# Patient Record
Sex: Female | Born: 1960 | Race: White | Hispanic: No | Smoking: Never smoker
Health system: Southern US, Community
[De-identification: ages and names within clinical notes are randomized; demographics above are authoritative.]

## PROBLEM LIST (undated history)

## (undated) DIAGNOSIS — E28319 Asymptomatic premature menopause: Secondary | ICD-10-CM

## (undated) DIAGNOSIS — E669 Obesity, unspecified: Secondary | ICD-10-CM

## (undated) DIAGNOSIS — T8859XA Other complications of anesthesia, initial encounter: Secondary | ICD-10-CM

## (undated) DIAGNOSIS — I10 Essential (primary) hypertension: Secondary | ICD-10-CM

## (undated) DIAGNOSIS — M1990 Unspecified osteoarthritis, unspecified site: Secondary | ICD-10-CM

## (undated) DIAGNOSIS — T4145XA Adverse effect of unspecified anesthetic, initial encounter: Secondary | ICD-10-CM

## (undated) DIAGNOSIS — E785 Hyperlipidemia, unspecified: Secondary | ICD-10-CM

## (undated) DIAGNOSIS — G4730 Sleep apnea, unspecified: Secondary | ICD-10-CM

## (undated) DIAGNOSIS — I480 Paroxysmal atrial fibrillation: Secondary | ICD-10-CM

## (undated) HISTORY — PX: TUBAL LIGATION: SHX77

## (undated) HISTORY — DX: Obesity, unspecified: E66.9

## (undated) HISTORY — PX: FOOT SURGERY: SHX648

## (undated) HISTORY — PX: OTHER SURGICAL HISTORY: SHX169

## (undated) HISTORY — DX: Asymptomatic premature menopause: E28.319

## (undated) HISTORY — DX: Paroxysmal atrial fibrillation: I48.0

## (undated) HISTORY — PX: CATARACT EXTRACTION W/ INTRAOCULAR LENS  IMPLANT, BILATERAL: SHX1307

## (undated) HISTORY — DX: Essential (primary) hypertension: I10

## (undated) HISTORY — DX: Unspecified osteoarthritis, unspecified site: M19.90

## (undated) HISTORY — DX: Hyperlipidemia, unspecified: E78.5

---

## 1980-08-18 HISTORY — PX: CHOLECYSTECTOMY: SHX55

## 2006-05-20 ENCOUNTER — Ambulatory Visit: Payer: Self-pay

## 2008-06-15 ENCOUNTER — Ambulatory Visit: Payer: Self-pay | Admitting: Internal Medicine

## 2008-07-18 ENCOUNTER — Ambulatory Visit: Payer: Self-pay | Admitting: Internal Medicine

## 2008-09-19 ENCOUNTER — Ambulatory Visit: Payer: Self-pay | Admitting: Internal Medicine

## 2009-07-28 ENCOUNTER — Emergency Department: Payer: Self-pay | Admitting: Internal Medicine

## 2011-04-18 ENCOUNTER — Ambulatory Visit (INDEPENDENT_AMBULATORY_CARE_PROVIDER_SITE_OTHER): Payer: Self-pay | Admitting: Internal Medicine

## 2011-04-18 ENCOUNTER — Encounter: Payer: Self-pay | Admitting: Internal Medicine

## 2011-04-18 DIAGNOSIS — H6690 Otitis media, unspecified, unspecified ear: Secondary | ICD-10-CM | POA: Insufficient documentation

## 2011-04-18 DIAGNOSIS — Z124 Encounter for screening for malignant neoplasm of cervix: Secondary | ICD-10-CM

## 2011-04-18 DIAGNOSIS — G43809 Other migraine, not intractable, without status migrainosus: Secondary | ICD-10-CM | POA: Insufficient documentation

## 2011-04-18 DIAGNOSIS — D126 Benign neoplasm of colon, unspecified: Secondary | ICD-10-CM | POA: Insufficient documentation

## 2011-04-18 DIAGNOSIS — Z1239 Encounter for other screening for malignant neoplasm of breast: Secondary | ICD-10-CM | POA: Insufficient documentation

## 2011-04-18 DIAGNOSIS — Z1211 Encounter for screening for malignant neoplasm of colon: Secondary | ICD-10-CM

## 2011-04-18 NOTE — Patient Instructions (Addendum)
Consider daily zyrtec (cetirizine) 10 mg daily to prevent recufrrent ear problems due to allergies causinguestavhian tube congestion,  Add sudafed PE 10 ng every 6 hours as needed for the first 2-3 days to decongest.  Consider contacting [facility] for knee compression stockings.  (website is [email or web address])

## 2011-04-19 NOTE — Assessment & Plan Note (Signed)
She is long overdue for an annual mammogram and is being scheduled today for one.

## 2011-04-19 NOTE — Assessment & Plan Note (Signed)
She will return in 3 months for her annual overdue physical and have PAP done.

## 2011-04-19 NOTE — Progress Notes (Signed)
  Subjective:    Patient ID: Jessica Clayton, female    DOB: [birth year], 50 y.o.   MRN: [id number]  HPI Jessica Clayton is a 50 yr old white female who presents to establish primary care.  She has had no medical followup in 5 years. She has no acute medical issues today.   Review of Systems  Constitutional: Positive for unexpected weight change. Negative for fever, chills, diaphoresis and activity change.  HENT: Negative for hearing loss, ear pain, nosebleeds, congestion, sore throat, facial swelling, rhinorrhea, sneezing, mouth sores, trouble swallowing, neck pain, neck stiffness, voice change, postnasal drip, sinus pressure, tinnitus and ear discharge.   Eyes: Negative for pain, discharge, redness and visual disturbance.  Respiratory: Negative for cough, chest tightness, shortness of breath, wheezing and stridor.   Cardiovascular: Negative for chest pain, palpitations and leg swelling.  Musculoskeletal: Negative for myalgias and arthralgias.  Skin: Negative for color change and rash.  Neurological: Negative for dizziness, weakness, light-headedness and headaches.  Hematological: Negative for adenopathy.       Objective:   Physical Exam  Constitutional: She is oriented to person, place, and time. She appears well-developed and well-nourished.  HENT:  Mouth/Throat: Oropharynx is clear and moist.  Eyes: EOM are normal. Pupils are equal, round, and reactive to light. No scleral icterus.  Neck: Normal range of motion. Neck supple. No JVD present. No thyromegaly present.  Cardiovascular: Normal rate, regular rhythm, normal heart sounds and intact distal pulses.   Pulmonary/Chest: Effort normal and breath sounds normal.  Abdominal: Soft. Bowel sounds are normal. She exhibits no mass. There is no tenderness.  Musculoskeletal: Normal range of motion. She exhibits no edema.  Lymphadenopathy:    She has no cervical adenopathy.  Neurological: She is alert and oriented to person, place, and time.  Skin:  Skin is warm and dry.  Psychiatric: She has a normal mood and affect. Judgment normal.          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

## 2011-04-19 NOTE — Assessment & Plan Note (Signed)
She will be scheduled for screenign colonoscopy this year.  SHe has a positive family history  but not at an early age.

## 2011-04-19 NOTE — Assessment & Plan Note (Signed)
She has not had a migraine in nearly one year.  Will issue rx for imitrex if headaches return.

## 2011-04-19 NOTE — Assessment & Plan Note (Signed)
Her exam today did suggest some congestion behind the ear.  Recommended a few days of sudafed PE to resolve the congestion and daily use of antihistamine.

## 2011-04-25 ENCOUNTER — Telehealth: Payer: Self-pay | Admitting: Internal Medicine

## 2011-04-25 NOTE — Telephone Encounter (Signed)
Pt aware of mammogram appointment , Pt also stated she needed an appointment with dr Mechele Collin @ [facility] for a colonscopy she wanted to know if you were going to make that appointment or does she

## 2011-05-14 ENCOUNTER — Telehealth: Payer: Self-pay | Admitting: Internal Medicine

## 2011-05-14 ENCOUNTER — Emergency Department: Payer: Self-pay | Admitting: *Deleted

## 2011-05-14 ENCOUNTER — Other Ambulatory Visit: Payer: Self-pay | Admitting: Internal Medicine

## 2011-05-14 MED ORDER — AMLODIPINE BESYLATE 2.5 MG PO TABS: 2.5000 mg | ORAL_TABLET | Freq: Every day | ORAL | Status: DC

## 2011-05-14 NOTE — Telephone Encounter (Signed)
Refill has been called in

## 2011-05-14 NOTE — Telephone Encounter (Signed)
Per Dr. Darrick Huntsman patient needs refill on Amlodiphine.

## 2011-05-15 ENCOUNTER — Ambulatory Visit (INDEPENDENT_AMBULATORY_CARE_PROVIDER_SITE_OTHER): Payer: Self-pay | Admitting: Internal Medicine

## 2011-05-15 ENCOUNTER — Encounter: Payer: Self-pay | Admitting: Internal Medicine

## 2011-05-15 DIAGNOSIS — E669 Obesity, unspecified: Secondary | ICD-10-CM | POA: Insufficient documentation

## 2011-05-15 DIAGNOSIS — F419 Anxiety disorder, unspecified: Secondary | ICD-10-CM | POA: Insufficient documentation

## 2011-05-15 DIAGNOSIS — H9209 Otalgia, unspecified ear: Secondary | ICD-10-CM

## 2011-05-15 DIAGNOSIS — F411 Generalized anxiety disorder: Secondary | ICD-10-CM

## 2011-05-15 DIAGNOSIS — H9203 Otalgia, bilateral: Secondary | ICD-10-CM

## 2011-05-15 DIAGNOSIS — I10 Essential (primary) hypertension: Secondary | ICD-10-CM

## 2011-05-15 DIAGNOSIS — R51 Headache: Secondary | ICD-10-CM

## 2011-05-15 MED ORDER — TRAMADOL HCL 50 MG PO TABS: 50.0000 mg | ORAL_TABLET | Freq: Four times a day (QID) | ORAL | Status: DC | PRN

## 2011-05-15 MED ORDER — ALPRAZOLAM 0.5 MG PO TABS: 0.5000 mg | ORAL_TABLET | Freq: Every day | ORAL | Status: DC | PRN

## 2011-05-15 NOTE — Assessment & Plan Note (Signed)
She has lost 5 lbs in the past months by addressing the sugar excess in her diet.  Recommended exercising 25 minutes daily to boost regimen.

## 2011-05-15 NOTE — Assessment & Plan Note (Signed)
I suspect her recent elevations were caused by anxiety as well as her recent use of OTC meds including ibuprofen.  Cotnineu 2.5 mg almlodipine and increase to5  Mg in one week for SBP > 130/80.

## 2011-05-15 NOTE — Patient Instructions (Signed)
Aoid all products  Containing ibuprofen, and naproxen for the next month. Use tylenol or tramadol as needed for pain .  Avoid all products containing Sudafed (pseudoephedrine) or PE (phenylephrine) which are in cold remedies.  Use Afrin nasal spray if needed for relief of congestion.    If you bp is> 130/80 on Monday, you may increase the amlodipine to 5 mg daily .  I am adding a low dose of alprazolam (Xanax) to use once a day if needed for feelings of panic or extreme anxiety.

## 2011-05-15 NOTE — Progress Notes (Signed)
  Subjective:    Patient ID: Jessica Clayton, female    DOB: 01/16/1961, 50 y.o.   MRN: [id number]  HPI  50 yo white female with history of migraine headaches, borderline hypertension, and obesity presents for urgent evaluation of  Elevated blood pressures.  Patient ahs been taking inupfrofen 800 mg once or twice daily since last week when she returned from a beach trip with a headache.  She noted elevated blood pressure in the 170s and was concerned that the headaches wsere due to hypertensive urgency.  Was seen by Employee Vilma Prader and was sent t ER for bp in the 190's. Was told to folowup up with me today.  She had called me prior to ER evaluaiton at which time I prescribed amlodipine and stopped her use of NSAIDs.     Review of Systems  Constitutional: Negative for fever, chills and unexpected weight change.  HENT: Negative for hearing loss, ear pain, nosebleeds, congestion, sore throat, facial swelling, rhinorrhea, sneezing, mouth sores, trouble swallowing, neck pain, neck stiffness, voice change, postnasal drip, sinus pressure, tinnitus and ear discharge.   Eyes: Negative for pain, discharge, redness and visual disturbance.  Respiratory: Negative for cough, chest tightness, shortness of breath, wheezing and stridor.   Cardiovascular: Negative for chest pain, palpitations and leg swelling.  Musculoskeletal: Negative for myalgias and arthralgias.  Skin: Negative for color change and rash.  Neurological: Positive for headaches. Negative for dizziness, weakness and light-headedness.  Hematological: Negative for adenopathy.  Psychiatric/Behavioral: Positive for sleep disturbance. The patient is nervous/anxious.        Objective:   Physical Exam  Constitutional: She is oriented to person, place, and time. She appears well-developed and well-nourished.  HENT:  Mouth/Throat: Oropharynx is clear and moist.  Eyes: EOM are normal. Pupils are equal, round, and reactive to light. No scleral icterus.    Neck: Normal range of motion. Neck supple. No JVD present. No thyromegaly present.  Cardiovascular: Normal rate, regular rhythm, normal heart sounds and intact distal pulses.   Pulmonary/Chest: Effort normal and breath sounds normal.  Abdominal: Soft. Bowel sounds are normal. She exhibits no mass. There is no tenderness.  Musculoskeletal: Normal range of motion. She exhibits no edema.  Lymphadenopathy:    She has no cervical adenopathy.  Neurological: She is alert and oriented to person, place, and time.  Skin: Skin is warm and dry.  Psychiatric: She has a normal mood and affect.          Assessment & Plan:

## 2011-05-15 NOTE — Assessment & Plan Note (Signed)
Aggravated by home stressor including daughter's difficulty raising her children with their father.  Patient is rasing her 51 yr old grandson.  Trial of alprazolam for insomnia or panic attacks.

## 2011-05-16 NOTE — Telephone Encounter (Signed)
Can I refer patient to Dr. Mechele Collin at [facility]?

## 2011-05-20 NOTE — Telephone Encounter (Signed)
Yes

## 2011-06-06 ENCOUNTER — Ambulatory Visit: Payer: Self-pay | Admitting: Internal Medicine

## 2011-06-25 ENCOUNTER — Encounter: Payer: Self-pay | Admitting: Internal Medicine

## 2011-07-02 ENCOUNTER — Telehealth: Payer: Self-pay | Admitting: Internal Medicine

## 2011-07-02 NOTE — Telephone Encounter (Signed)
Patient called and stated since you started her on the BP medication her BP is still elevated.  She stated on two different occasions it has been 176/84.  But most of the time it is in the 140's.  She said her pulse readings have been in between 60-68, and you had told her you would like the BP to be in the 130's but she said it has never been that low.  Please advise.

## 2011-07-02 NOTE — Telephone Encounter (Signed)
She was instructed to increase the amlodipine to 5 mg daily for bp > 130/80.  Has she done that?  If yes,  We will add Hctz 25 mg one tablet daily to the 5 mg of amlodipine.  It is a diuretic and will make her urinate more so make sure she takes it as soon as she gets up in the morning.  Qty 30 2 refills

## 2011-07-03 NOTE — Telephone Encounter (Signed)
Left message asking patient to return my call.

## 2011-07-15 NOTE — Telephone Encounter (Signed)
Left another message for patient to return my call.

## 2011-07-24 NOTE — Telephone Encounter (Signed)
I called patient back, she answered this time and has been notified of the message.  She stated she has increase the amlodipine to 5 mg and her BP has been running great.  I advised her that if her BP was elevated again to call back and we will call in the HCTZ as Dr. Darrick Huntsman said.

## 2011-09-02 ENCOUNTER — Other Ambulatory Visit: Payer: Self-pay | Admitting: *Deleted

## 2011-09-02 MED ORDER — AMLODIPINE BESYLATE 2.5 MG PO TABS: 2.5000 mg | ORAL_TABLET | Freq: Every day | ORAL | Status: DC

## 2011-09-08 ENCOUNTER — Ambulatory Visit (INDEPENDENT_AMBULATORY_CARE_PROVIDER_SITE_OTHER): Payer: PRIVATE HEALTH INSURANCE | Admitting: Internal Medicine

## 2011-09-08 ENCOUNTER — Encounter: Payer: Self-pay | Admitting: Internal Medicine

## 2011-09-08 ENCOUNTER — Other Ambulatory Visit (HOSPITAL_COMMUNITY)
Admission: RE | Admit: 2011-09-08 | Discharge: 2011-09-08 | Disposition: A | Discharge status: Home / Self Care | Payer: PRIVATE HEALTH INSURANCE | Source: Ambulatory Visit | Attending: Internal Medicine | Admitting: Internal Medicine

## 2011-09-08 VITALS — BP 134/86 | HR 72 | Temp 98.5°F | Wt 229.0 lb

## 2011-09-08 DIAGNOSIS — Z01419 Encounter for gynecological examination (general) (routine) without abnormal findings: Secondary | ICD-10-CM | POA: Insufficient documentation

## 2011-09-08 DIAGNOSIS — Z1159 Encounter for screening for other viral diseases: Secondary | ICD-10-CM | POA: Insufficient documentation

## 2011-09-08 DIAGNOSIS — Z1211 Encounter for screening for malignant neoplasm of colon: Secondary | ICD-10-CM

## 2011-09-08 DIAGNOSIS — I10 Essential (primary) hypertension: Secondary | ICD-10-CM

## 2011-09-08 DIAGNOSIS — Z124 Encounter for screening for malignant neoplasm of cervix: Secondary | ICD-10-CM

## 2011-09-08 MED ORDER — LISINOPRIL-HYDROCHLOROTHIAZIDE 20-25 MG PO TABS: 1.0000 | ORAL_TABLET | Freq: Every day | ORAL | Status: DC

## 2011-09-08 NOTE — Progress Notes (Signed)
  Subjective:     Jessica Clayton is a 51 y.o. female  with a history of migraine headaches hypertension and obesity who is here for a comprehensive physical exam. The patient reports problems - In achieving weight loss.  History   Social History  . Marital Status: Divorced    Spouse Name: N/A    Number of Children: N/A  . Years of Education: N/A   Occupational History  . Not on file.   Social History Main Topics  . Smoking status: Never Smoker   . Smokeless tobacco: Never Used  . Alcohol Use: No  . Drug Use: No  . Sexually Active: Not on file   Other Topics Concern  . Not on file   Social History Narrative  . No narrative on file   Health Maintenance  Topic Date Due  . Pap Smear  07/11/1978  . Tetanus/tdap  07/12/1979  . Colonoscopy  07/11/2010  . Influenza Vaccine  05/19/2011  . Mammogram  06/05/2013    The following portions of the patient's history were reviewed and updated as appropriate: allergies, current medications, past family history, past medical history, past social history, past surgical history and problem list.  Review of Systems A comprehensive review of systems was negative.   Objective:  BP 134/86  Pulse 72  Temp(Src) 98.5 F (36.9 C) (Oral)  Wt 229 lb (103.874 kg)  SpO2 97%  General Appearance:    Alert, cooperative, no distress, appears stated age  Head:    Normocephalic, without obvious abnormality, atraumatic  Eyes:    PERRL, conjunctiva/corneas clear, EOM's intact, fundi    benign, both eyes  Ears:    Normal TM's and external ear canals, both ears  Nose:   Nares normal, septum midline, mucosa normal, no drainage    or sinus tenderness  Throat:   Lips, mucosa, and tongue normal; teeth and gums normal  Neck:   Supple, symmetrical, trachea midline, no adenopathy;    thyroid:  no enlargement/tenderness/nodules; no carotid   bruit or JVD  Back:     Symmetric, no curvature, ROM normal, no CVA tenderness  Lungs:     Clear to auscultation  bilaterally, respirations unlabored  Chest Wall:    No tenderness or deformity   Heart:    Regular rate and rhythm, S1 and S2 normal, no murmur, rub   or gallop  Breast Exam:    No tenderness, masses, or nipple abnormality  Abdomen:     Soft, non-tender, bowel sounds active all four quadrants,    no masses, no organomegaly  Genitalia:    Normal female without lesion, discharge or tenderness  Rectal:    Normal tone, normal prostate, no masses or tenderness;   guaiac negative stool  Extremities:   Extremities normal, atraumatic, no cyanosis or edema  Pulses:   2+ and symmetric all extremities  Skin:   Skin color, texture, turgor normal, no rashes or lesions  Lymph nodes:   Cervical, supraclavicular, and axillary nodes normal  Neurologic:   CNII-XII intact, normal strength, sensation and reflexes    throughout        Assessment:    Healthy female annual exam.  Obesity Hypertension Screening for colon CA   Plan:  PAP smear done She is up to date on mammogram She is due for colonoscopy and requests referral to Dr. Mechele Collin   See After Visit Summary for Counseling Recommendations

## 2011-09-08 NOTE — Assessment & Plan Note (Signed)
Her blood pressure is not well controlled on current regimen and she is having an exacerbation of lower extremity edema. We are changing her blood pressure medication today from amlodipine to lisinopril/HCTZ. She was given an order form for a repeat female in one week.

## 2011-09-08 NOTE — Patient Instructions (Signed)
WE ARE CHANGING YOUR BP MEDICATION TO LISINOPRIL/HCTZ TO MANAGED GTHE FLUID IN YOUR LEGS.Marland Kitchen

## 2011-09-13 LAB — HM PAP SMEAR: HM Pap smear: NORMAL

## 2011-09-15 ENCOUNTER — Encounter: Payer: Self-pay | Admitting: *Deleted

## 2011-09-21 ENCOUNTER — Other Ambulatory Visit: Payer: Self-pay | Admitting: Internal Medicine

## 2011-09-21 LAB — BASIC METABOLIC PANEL
BUN: 16 mg/dL (ref 7–18)
Calcium, Total: 9 mg/dL (ref 8.5–10.1)
Chloride: 97 mmol/L — ABNORMAL LOW (ref 98–107)
Co2: 31 mmol/L (ref 21–32)
Creatinine: 0.78 mg/dL (ref 0.60–1.30)
Glucose: 97 mg/dL (ref 65–99)
Potassium: 4.2 mmol/L (ref 3.5–5.1)
Sodium: 138 mmol/L (ref 136–145)

## 2011-10-28 ENCOUNTER — Encounter: Payer: Self-pay | Admitting: Internal Medicine

## 2011-11-06 ENCOUNTER — Ambulatory Visit: Payer: Self-pay | Admitting: Unknown Physician Specialty

## 2011-11-19 ENCOUNTER — Telehealth: Payer: Self-pay | Admitting: Internal Medicine

## 2011-11-19 NOTE — Telephone Encounter (Signed)
Office Message [address] [place], [place] [address] p. [phone] f. [phone] To: [facility] (Daytime Triage) Fax: [phone] From: [facility] Date/ Time: 11/18/2011 3:42 PM Taken By: Ezra Sites, RN Caller: Liborio Nixon Facility: Not Collected Patient: Jessica Clayton, Jessica Clayton DOB: July 28, 1961 Phone: [phone] Reason for Call: Caller was unable to be reached on callback - Left Message Regarding Appointment: No Appt Date: Appt Time: Unknown Provider: Reason: Details: Outcome

## 2011-11-25 ENCOUNTER — Encounter: Payer: Self-pay | Admitting: Internal Medicine

## 2012-01-26 ENCOUNTER — Telehealth: Payer: Self-pay | Admitting: Internal Medicine

## 2012-01-26 NOTE — Telephone Encounter (Signed)
Caller: Jessica Clayton/Patient; PCP: Duncan Dull; CB#: [phone];  Call regarding Acid Reflux;  onset: 11/26/11.  Afebrile.  LMP/post menopausal.  Tried Zantac and Tums without relief.  Reports increasing frequency of "spasms" in throat/neck lasting 8-9 min that ends with coughing; during day and night.  Currently sx wake from sleep. Gets a sensation of throat closing and pain. Sister with Lupus died at age 52 of CAD. Advised to see UC or ED within 8 hrs for one or more occurrances of unexplained pain in neck lasing more than a few minutes that has not been evaluated by healthcare provider and has risk factors for cardiovascular disease per Heartburn [facility]in. After evaluation tonight, instructed to call office 01/27/12 for appt.

## 2012-01-27 NOTE — Telephone Encounter (Signed)
Patient called back in this morning for an appointment and I offered her one for Thursday however she couldn't come in, she was also offered an appointment on Friday with Dr Dan Humphreys however patient declined these days as she had to work.  She wants something this afternoon. Please advise her number to be reached today is [phone].

## 2012-01-27 NOTE — Telephone Encounter (Signed)
Tried calling her at number provided, but was told that she had left for the day. Tried calling her home/cell number that is listed in her chart, but got no answer and no way to leave a message.

## 2012-01-29 NOTE — Telephone Encounter (Signed)
Patient returned call and scheduled appt for next week.

## 2012-01-30 ENCOUNTER — Other Ambulatory Visit: Payer: Self-pay | Admitting: Internal Medicine

## 2012-01-30 DIAGNOSIS — R51 Headache: Secondary | ICD-10-CM

## 2012-02-01 MED ORDER — TRAMADOL HCL 50 MG PO TABS: 50.0000 mg | ORAL_TABLET | Freq: Four times a day (QID) | ORAL | Status: DC | PRN

## 2012-02-05 ENCOUNTER — Encounter: Payer: Self-pay | Admitting: Internal Medicine

## 2012-02-05 ENCOUNTER — Ambulatory Visit (INDEPENDENT_AMBULATORY_CARE_PROVIDER_SITE_OTHER): Payer: PRIVATE HEALTH INSURANCE | Admitting: Internal Medicine

## 2012-02-05 ENCOUNTER — Other Ambulatory Visit: Payer: Self-pay | Admitting: Internal Medicine

## 2012-02-05 VITALS — BP 108/64 | HR 60 | Temp 97.9°F | Resp 16 | Wt 217.5 lb

## 2012-02-05 DIAGNOSIS — R079 Chest pain, unspecified: Secondary | ICD-10-CM

## 2012-02-05 DIAGNOSIS — E785 Hyperlipidemia, unspecified: Secondary | ICD-10-CM

## 2012-02-05 DIAGNOSIS — I10 Essential (primary) hypertension: Secondary | ICD-10-CM

## 2012-02-05 DIAGNOSIS — K209 Esophagitis, unspecified without bleeding: Secondary | ICD-10-CM

## 2012-02-05 DIAGNOSIS — E669 Obesity, unspecified: Secondary | ICD-10-CM

## 2012-02-05 HISTORY — DX: Chest pain, unspecified: R07.9

## 2012-02-05 LAB — COMPREHENSIVE METABOLIC PANEL
Albumin: 3.8 g/dL (ref 3.4–5.0)
Alkaline Phosphatase: 90 U/L (ref 50–136)
Anion Gap: 6 — ABNORMAL LOW (ref 7–16)
BUN: 9 mg/dL (ref 7–18)
Bilirubin,Total: 0.6 mg/dL (ref 0.2–1.0)
Creatinine: 0.8 mg/dL (ref 0.60–1.30)
EGFR (African American): >60
EGFR (Non-African Amer.): >60
Glucose: 101 mg/dL — ABNORMAL HIGH (ref 65–99)
Potassium: 3.9 mmol/L (ref 3.5–5.1)
SGPT (ALT): 43 U/L
Total Protein: 7.2 g/dL (ref 6.4–8.2)

## 2012-02-05 LAB — LIPID PANEL
Cholesterol: 149 mg/dL (ref 0–200)
HDL Cholesterol: 39 mg/dL — ABNORMAL LOW (ref 40–60)
VLDL Cholesterol, Calc: 17 mg/dL (ref 5–40)

## 2012-02-05 LAB — CK-MB: CK-MB: 0.7 ng/mL (ref 0.5–3.6)

## 2012-02-05 MED ORDER — ROSUVASTATIN CALCIUM 10 MG PO TABS: 10.0000 mg | ORAL_TABLET | Freq: Every day | ORAL | Status: DC

## 2012-02-05 MED ORDER — NITROGLYCERIN 0.4 MG SL SUBL: 0.4000 mg | SUBLINGUAL_TABLET | SUBLINGUAL | Status: DC | PRN

## 2012-02-05 MED ORDER — DEXLANSOPRAZOLE 60 MG PO CPDR: 60.0000 mg | DELAYED_RELEASE_CAPSULE | Freq: Every day | ORAL | Status: DC

## 2012-02-05 NOTE — Assessment & Plan Note (Addendum)
Symptoms are atypical and may be due to esophagitis but given her multple CRFS I am rxing nTG SL and referring to Cardiology for evaluation.  EKG today was notable for RSR in V2 and TWI in III only. Am also changing omeprazole to Dexilant as a trial. Samples given.

## 2012-02-05 NOTE — Assessment & Plan Note (Signed)
Her last fasting lipid panel was done at [facility] in Oct 2011 at which time LDL was 70 but trigs were > 200 (see labs).  I have given er samples of crestor 10 mg today to start, given concern for CAD .  Fasting lipids today., at [facility]

## 2012-02-05 NOTE — Assessment & Plan Note (Signed)
Better controlled on current regimen.

## 2012-02-05 NOTE — Progress Notes (Signed)
Patient ID: Jessica Clayton, female   DOB: [birth year], 51 y.o.   MRN: [id number]  Patient Active Problem List  Diagnosis  . Headache, variant migraine  . Otitis media  . Screening for cervical cancer  . Screening for breast cancer  . duew Screening for colon cancer  . Hypertension  . Anxiety  . Obesity (BMI 30-39.9)  . Hyperlipidemia LDL goal < 100  . Chest pain at rest    Subjective:  CC:   Chief Complaint  Patient presents with  . Cough    HPI:   Jessica Clayton a 51 y.o. female who presents with recent onset of substernal chest pain accompanied  by a cough,  Lasting about 5 to 10  minutes.,  accompaneid by a feeling of pressure in her left jaw/ear. Has also had an episode that that was accompanied by left shoulder blade pain that lasted several hours.  That particular episode occurred 3 weeks ago, and after talking with Dr. 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  She continues to have daily episodes. She also notes exertional dyspnea, and reports almost passed out after mowing her lawn recently from extreme fatigue.   CRFS incude obesity, hypertension and untreated hyperlipidemia.  Her sister died last year after an AMI at age of 52 but sister was a smoker.     Past Medical History  Diagnosis Date  . Arthritis     left shoulder, injected by Reita Chard 2012  . Menopause, premature     at age 63  . Hypertension   . Hyperlipidemia     hypertriglyceridemia  . Obesity (BMI 30-39.9)     Past Surgical History  Procedure Date  . Cholecystectomy 1982         The following portions of the patient's history were reviewed and updated as appropriate: Allergies, current medications, and problem list.    Review of Systems:   12 Pt  review of systems was negative except those addressed in the HPI,     History   Social History  . Marital Status: Divorced    Spouse Name: N/A    Number of Children: N/A  .  Years of Education: N/A   Occupational History  . Not on file.   Social History Main Topics  . Smoking status: Never Smoker   . Smokeless tobacco: Never Used  . Alcohol Use: No  . Drug Use: No  . Sexually Active: Not on file   Other Topics Concern  . Not on file   Social History Narrative  . No narrative on file    Objective:  BP 108/64  Pulse 60  Temp 97.9 F (36.6 C) (Oral)  Resp 16  Wt 217 lb 8 oz (98.657 kg)  SpO2 97%  General appearance: alert, cooperative and appears stated age Ears: normal TM's and external ear canals both ears Throat: lips, mucosa, and tongue normal; teeth and gums normal Neck: no adenopathy, no carotid bruit, supple, symmetrical, trachea midline and thyroid not enlarged, symmetric, no tenderness/mass/nodules Back: symmetric, no curvature. ROM normal. No CVA tenderness. Lungs: clear to auscultation bilaterally Heart: regular rate and rhythm, S1, S2 normal, no murmur, click, rub or gallop Abdomen: soft, non-tender; bowel sounds normal; no masses,  no organomegaly Pulses: 2+ and symmetric Skin: Skin color, texture, turgor normal. No rashes or lesions Lymph nodes: Cervical, supraclavicular, and axillary nodes normal.  Assessment and Plan:  Obesity (BMI 30-39.9)  She has lost 12 lbs since last visit by restricting starches.  She is not exercising regularly.   Hypertension Better controlled on current regimen.   Hyperlipidemia LDL goal < 100 Her last fasting lipid panel was done at [facility] in Oct 2011 at which time LDL was 70 but trigs were > 200 (see labs).  I have given er samples of crestor 10 mg today to start, given concern for CAD .  Fasting lipids today., at [facility]  Chest pain at rest Symptoms are atypical and may be due to esophagitis but given her multple CRFS I am rxing nTG SL and referring to Cardiology for evaluation.  EKG today was notable for RSR in V2 and TWI in III only. Am also changing omeprazole to Dexilant as a trial. Samples  given.   Updated Medication List Outpatient Encounter Prescriptions as of 02/05/2012  Medication Sig Dispense Refill  . acetaminophen (TYLENOL) 325 MG tablet Take 650 mg by mouth every 6 (six) hours as needed.        Marland Kitchen lisinopril-hydrochlorothiazide (PRINZIDE,ZESTORETIC) 20-25 MG per tablet Take 1 tablet by mouth daily.  90 tablet  3  . Multiple Vitamin (MULTIVITAMIN) tablet Take 1 tablet by mouth daily.        Marland Kitchen DISCONTD: omeprazole (PRILOSEC OTC) 20 MG tablet Take 20 mg by mouth daily.      Marland Kitchen dexlansoprazole (DEXILANT) 60 MG capsule Take 1 capsule (60 mg total) by mouth daily.  30 capsule  1  . nitroGLYCERIN (NITROSTAT) 0.4 MG SL tablet Place 1 tablet (0.4 mg total) under the tongue every 5 (five) minutes as needed for chest pain.  50 tablet  3  . rosuvastatin (CRESTOR) 10 MG tablet Take 1 tablet (10 mg total) by mouth daily.  42 tablet  0  . DISCONTD: ALPRAZolam (XANAX) 0.5 MG tablet Take 1 tablet (0.5 mg total) by mouth daily as needed for sleep or anxiety.  30 tablet  3  . DISCONTD: traMADol (ULTRAM) 50 MG tablet Take 1 tablet (50 mg total) by mouth every 6 (six) hours as needed for pain.  120 tablet  3     Orders Placed This Encounter  Procedures  . Ambulatory referral to Cardiology  . EKG 12-Lead    Return in about 1 month (around 03/06/2012).

## 2012-02-05 NOTE — Patient Instructions (Addendum)
I am referring you to [facility] for an evaluation of your heart.  In the meantime I am changing your reflux medication to  Dexilant (samples, once daily) and prescribing you a nitroglycerin tablet to take the next time you have an episode of chest pain   I have also given you samples of Crestor. Start them tonight,

## 2012-02-05 NOTE — Assessment & Plan Note (Signed)
She has lost 12 lbs since last visit by restricting starches.  She is not exercising regularly.

## 2012-02-09 ENCOUNTER — Telehealth: Payer: Self-pay | Admitting: Internal Medicine

## 2012-02-09 ENCOUNTER — Ambulatory Visit: Payer: PRIVATE HEALTH INSURANCE | Admitting: Cardiovascular Disease

## 2012-02-09 NOTE — Telephone Encounter (Signed)
Patient notified

## 2012-02-09 NOTE — Telephone Encounter (Signed)
Labs done last Friday were normal, but i still want her to see the cardiologist and continue the crestor until we rule out heart disease

## 2012-02-13 ENCOUNTER — Observation Stay: Payer: Self-pay | Admitting: Internal Medicine

## 2012-02-13 LAB — BASIC METABOLIC PANEL
BUN: 12 mg/dL (ref 7–18)
Chloride: 96 mmol/L — ABNORMAL LOW (ref 98–107)
Co2: 28 mmol/L (ref 21–32)
EGFR (Non-African Amer.): >60
Glucose: 110 mg/dL — ABNORMAL HIGH (ref 65–99)
Osmolality: 265 (ref 275–301)
Potassium: 3.5 mmol/L (ref 3.5–5.1)
Sodium: 132 mmol/L — ABNORMAL LOW (ref 136–145)

## 2012-02-13 LAB — CBC
HCT: 37.4 % (ref 35.0–47.0)
HGB: 12.8 g/dL (ref 12.0–16.0)
MCH: 30.5 pg (ref 26.0–34.0)
MCV: 89 fL (ref 80–100)
Platelet: 317 10*3/uL (ref 150–440)
RBC: 4.2 10*6/uL (ref 3.80–5.20)
RDW: 13 % (ref 11.5–14.5)

## 2012-02-13 LAB — TROPONIN I: Troponin-I: <0.02 ng/mL

## 2012-02-16 ENCOUNTER — Encounter: Payer: Self-pay | Admitting: Internal Medicine

## 2012-03-01 ENCOUNTER — Ambulatory Visit (INDEPENDENT_AMBULATORY_CARE_PROVIDER_SITE_OTHER): Payer: PRIVATE HEALTH INSURANCE | Admitting: Internal Medicine

## 2012-03-01 ENCOUNTER — Encounter: Payer: Self-pay | Admitting: Internal Medicine

## 2012-03-01 VITALS — BP 100/70 | HR 62 | Temp 98.5°F | Wt 215.0 lb

## 2012-03-01 DIAGNOSIS — R911 Solitary pulmonary nodule: Secondary | ICD-10-CM

## 2012-03-01 DIAGNOSIS — R0600 Dyspnea, unspecified: Secondary | ICD-10-CM

## 2012-03-01 DIAGNOSIS — R0609 Other forms of dyspnea: Secondary | ICD-10-CM

## 2012-03-01 DIAGNOSIS — R079 Chest pain, unspecified: Secondary | ICD-10-CM

## 2012-03-01 DIAGNOSIS — R4789 Other speech disturbances: Secondary | ICD-10-CM

## 2012-03-01 DIAGNOSIS — J385 Laryngeal spasm: Secondary | ICD-10-CM

## 2012-03-01 DIAGNOSIS — R0989 Other specified symptoms and signs involving the circulatory and respiratory systems: Secondary | ICD-10-CM

## 2012-03-01 MED ORDER — ALPRAZOLAM 0.25 MG PO TABS: 0.2500 mg | ORAL_TABLET | Freq: Two times a day (BID) | ORAL | Status: DC | PRN

## 2012-03-01 MED ORDER — HYDROCHLOROTHIAZIDE 12.5 MG PO TABS: 12.5000 mg | ORAL_TABLET | Freq: Every day | ORAL | Status: DC

## 2012-03-01 NOTE — Progress Notes (Signed)
Patient ID: Jessica Clayton, female   DOB: 07-21-1961, 51 y.o.   MRN: [id number] . Patient Active Problem List  Diagnosis  . Headache, variant migraine  . Otitis media  . Screening for cervical cancer  . Screening for breast cancer  . duew Screening for colon cancer  . Hypertension  . Anxiety  . Obesity (BMI 30-39.9)  . Hyperlipidemia LDL goal < 100  . Chest pain at rest  . Laryngospasm  . Pulmonary nodule, right    Subjective:  CC:   Chief Complaint  Patient presents with  . Follow-up    HPI:   Jessica Clayton a 51 y.o. female who presents Hospital followup for an Overnight stay on June 28th  for evaluation of an episode of progressive shortness of breath and chest pressure.  Symptoms Occurred in the late afternoon and extended into the evening.  she took a nitroglycerin tablet with no appreciable change in symptoms.  She developed trouble phonating and speaking in complete sentences and was taken to the ER.  She ruled out for ischemic event with negative cardiac enzymes x3 she ruled out for PE with CT of the chest which was noted some incidental pulmonary nodules 3 mm in size. She underwent a stress test which was normal. Her symptoms were treated to GERD and panic disorder. She does recognize that she lost her sister a year ago and is still grieving over that loss. However she refuses to treat her symptoms tympanicus disc is concerned that she may be having a laryngeal edema reaction to the lisinopril as she is on. She's worried that she has an occult pulmonary process since she is overweight and does get short of breath with mowing the grass. .  Requesting ENT and pulmnology eval.     Past Medical History  Diagnosis Date  . Arthritis     left shoulder, injected by Reita Chard 2012  . Menopause, premature     at age 54  . Hypertension   . Hyperlipidemia     hypertriglyceridemia  . Obesity (BMI 30-39.9)     Past Surgical History  Procedure Date  . Cholecystectomy 1982      The following portions of the patient's history were reviewed and updated as appropriate: Allergies, current medications, and problem list.    Review of Systems:  Chest pressure, dyspnea with exertion, globus feeling. The rest of a comprehensive  review of systems was negative except those addressed in the HPI,     History   Social History  . Marital Status: Divorced    Spouse Name: N/A    Number of Children: N/A  . Years of Education: N/A   Occupational History  . Not on file.   Social History Main Topics  . Smoking status: Never Smoker   . Smokeless tobacco: Never Used  . Alcohol Use: No  . Drug Use: No  . Sexually Active: Not on file   Other Topics Concern  . Not on file   Social History Narrative  . No narrative on file    Objective:  BP 100/70  Pulse 62  Temp 98.5 F (36.9 C) (Oral)  Wt 215 lb (97.523 kg)  SpO2 99%  General appearance: alert, cooperative and appears stated age Ears: normal TM's and external ear canals both ears Throat: lips, mucosa, and tongue normal; teeth and gums normal Neck: no adenopathy, no carotid bruit, supple, symmetrical, trachea midline and thyroid not enlarged, symmetric, no tenderness/mass/nodules Back: symmetric, no curvature. ROM normal. No CVA  tenderness. Lungs: clear to auscultation bilaterally Heart: regular rate and rhythm, S1, S2 normal, no murmur, click, rub or gallop Abdomen: soft, non-tender; bowel sounds normal; no masses,  no organomegaly Pulses: 2+ and symmetric Skin: Skin color, texture, turgor normal. No rashes or lesions Lymph nodes: Cervical, supraclavicular, and axillary nodes normal.  Assessment and Plan:  Laryngospasm Versus intense anxiety reaction. I have stopped her lisinopril as of today. ENT evaluation for evaluation of vocal cords.  Chest pain at rest With a negative stress test and negative cardiac enzymes at the time she was having several hours of pain it is unlikely that she has  occult coronary disease. She is not a smoker or diabetic. I did reassure her of this but it would be best to have her have a second opinion by a cardiologist she tests, Dr.Arida. She missed her appt with him in June has been rescheduled for later on this month.  Pulmonary nodule, right She had a chest CT done in late June during admission today her and see for chest pain shortness of breath. While negative for PE it did show to 3 mm pulmonary nodules on the right.   Updated Medication List Outpatient Encounter Prescriptions as of 03/01/2012  Medication Sig Dispense Refill  . acetaminophen (TYLENOL) 325 MG tablet Take 650 mg by mouth every 6 (six) hours as needed.        Marland Kitchen dexlansoprazole (DEXILANT) 60 MG capsule Take 1 capsule (60 mg total) by mouth daily.  30 capsule  1  . Multiple Vitamin (MULTIVITAMIN) tablet Take 1 tablet by mouth daily.        . nitroGLYCERIN (NITROSTAT) 0.4 MG SL tablet Place 1 tablet (0.4 mg total) under the tongue every 5 (five) minutes as needed for chest pain.  50 tablet  3  . rosuvastatin (CRESTOR) 10 MG tablet Take 1 tablet (10 mg total) by mouth daily.  42 tablet  0  . DISCONTD: lisinopril-hydrochlorothiazide (PRINZIDE,ZESTORETIC) 20-25 MG per tablet Take 1 tablet by mouth daily.  90 tablet  3  . ALPRAZolam (XANAX) 0.25 MG tablet Take 1 tablet (0.25 mg total) by mouth 2 (two) times daily as needed for sleep or anxiety.  60 tablet  2  . hydrochlorothiazide (HYDRODIURIL) 12.5 MG tablet Take 1 tablet (12.5 mg total) by mouth daily.  30 tablet  3     Orders Placed This Encounter  Procedures  . Ambulatory referral to Pulmonology  . Ambulatory referral to ENT  . Pulmonary function test    No Follow-up on file.

## 2012-03-01 NOTE — Assessment & Plan Note (Signed)
With a negative stress test and negative cardiac enzymes at the time she was having several hours of pain it is unlikely that she has occult coronary disease. She is not a smoker or diabetic. I did reassure her of this but it would be best to have her have a second opinion by a cardiologist she tests, Dr.Arida. She missed her appt with him in June has been rescheduled for later on this month.

## 2012-03-01 NOTE — Patient Instructions (Addendum)
Stop the lisinopril/hct   Completely.    If bp rises to > 130/80,  Start daily hctz in the morning.   Trial of alprazolam , low dose for your next  episode of "doom"   ENT evaluation  Of  throat  and vocal cords,  And pulmonology referral  /pulmonary function tests to evaluate breathing

## 2012-03-01 NOTE — Assessment & Plan Note (Signed)
Versus intense anxiety reaction. I have stopped her lisinopril as of today. ENT evaluation for evaluation of vocal cords.

## 2012-03-01 NOTE — Assessment & Plan Note (Signed)
She had a chest CT done in late June during admission today her and see for chest pain shortness of breath. While negative for PE it did show to 3 mm pulmonary nodules on the right.

## 2012-03-08 ENCOUNTER — Ambulatory Visit: Payer: PRIVATE HEALTH INSURANCE | Admitting: Internal Medicine

## 2012-03-09 ENCOUNTER — Ambulatory Visit: Payer: PRIVATE HEALTH INSURANCE | Admitting: Cardiovascular Disease

## 2012-03-12 ENCOUNTER — Other Ambulatory Visit: Payer: Self-pay | Admitting: Internal Medicine

## 2012-03-12 DIAGNOSIS — E785 Hyperlipidemia, unspecified: Secondary | ICD-10-CM

## 2012-03-12 DIAGNOSIS — K209 Esophagitis, unspecified without bleeding: Secondary | ICD-10-CM

## 2012-03-12 MED ORDER — ROSUVASTATIN CALCIUM 10 MG PO TABS: 10.0000 mg | ORAL_TABLET | Freq: Every day | ORAL | Status: DC

## 2012-03-12 MED ORDER — DEXLANSOPRAZOLE 60 MG PO CPDR: 60.0000 mg | DELAYED_RELEASE_CAPSULE | Freq: Every day | ORAL | Status: DC

## 2012-03-24 ENCOUNTER — Encounter: Payer: Self-pay | Admitting: Cardiovascular Disease

## 2012-03-26 ENCOUNTER — Institutional Professional Consult (permissible substitution): Payer: PRIVATE HEALTH INSURANCE | Admitting: Pulmonary Disease

## 2012-03-29 ENCOUNTER — Institutional Professional Consult (permissible substitution): Payer: PRIVATE HEALTH INSURANCE | Admitting: Pulmonary Disease

## 2012-06-29 ENCOUNTER — Ambulatory Visit: Payer: Self-pay | Admitting: Internal Medicine

## 2012-07-13 LAB — HM MAMMOGRAPHY: HM Mammogram: NORMAL

## 2012-07-16 ENCOUNTER — Encounter: Payer: Self-pay | Admitting: Internal Medicine

## 2012-10-20 ENCOUNTER — Ambulatory Visit: Payer: Self-pay | Admitting: General Practice

## 2012-11-18 ENCOUNTER — Telehealth: Payer: Self-pay | Admitting: *Deleted

## 2012-11-18 NOTE — Telephone Encounter (Signed)
Refill Request  Alprazolam 0.25 mg tablet  #60  Take one tablet 2 times a day as needed for sleep or anxiety

## 2012-11-19 ENCOUNTER — Telehealth: Payer: Self-pay | Admitting: General Practice

## 2012-11-19 MED ORDER — ALPRAZOLAM 0.25 MG PO TABS: 0.2500 mg | ORAL_TABLET | Freq: Two times a day (BID) | ORAL | Status: AC | PRN

## 2012-11-19 NOTE — Telephone Encounter (Signed)
Med phoned in on 4/4.

## 2012-11-19 NOTE — Telephone Encounter (Signed)
Ok to refil alprazolam

## 2012-11-23 NOTE — Telephone Encounter (Signed)
Error

## 2013-01-31 ENCOUNTER — Telehealth: Payer: Self-pay | Admitting: *Deleted

## 2013-01-31 DIAGNOSIS — R5381 Other malaise: Secondary | ICD-10-CM

## 2013-01-31 DIAGNOSIS — I10 Essential (primary) hypertension: Secondary | ICD-10-CM

## 2013-01-31 DIAGNOSIS — E785 Hyperlipidemia, unspecified: Secondary | ICD-10-CM

## 2013-01-31 DIAGNOSIS — R5383 Other fatigue: Secondary | ICD-10-CM

## 2013-01-31 DIAGNOSIS — E669 Obesity, unspecified: Secondary | ICD-10-CM

## 2013-01-31 NOTE — Telephone Encounter (Signed)
Refill request  Lisinopril-HCTZ 20-25 mg  #90  Take 1 tablet by mouth daily

## 2013-02-01 NOTE — Telephone Encounter (Signed)
LMTCB

## 2013-02-01 NOTE — Telephone Encounter (Signed)
She has not been seen in over a year. She has not had labs in over a year. No refills until she has renal funciton checked,  I willl order complete labs,  She should make appt for lab visit. fI will refill prior to office visit if I see her labs

## 2013-02-04 NOTE — Telephone Encounter (Signed)
Called patient left detailed message on voicemail

## 2013-02-10 ENCOUNTER — Ambulatory Visit: Payer: PRIVATE HEALTH INSURANCE | Admitting: Internal Medicine

## 2013-02-16 ENCOUNTER — Encounter: Payer: Self-pay | Admitting: Adult Health

## 2013-02-16 ENCOUNTER — Ambulatory Visit (INDEPENDENT_AMBULATORY_CARE_PROVIDER_SITE_OTHER): Payer: 59 | Admitting: Adult Health

## 2013-02-16 VITALS — BP 122/84 | HR 56 | Temp 97.9°F | Resp 12 | Wt 224.0 lb

## 2013-02-16 DIAGNOSIS — I10 Essential (primary) hypertension: Secondary | ICD-10-CM

## 2013-02-16 DIAGNOSIS — E785 Hyperlipidemia, unspecified: Secondary | ICD-10-CM

## 2013-02-16 DIAGNOSIS — F411 Generalized anxiety disorder: Secondary | ICD-10-CM

## 2013-02-16 DIAGNOSIS — F419 Anxiety disorder, unspecified: Secondary | ICD-10-CM

## 2013-02-16 DIAGNOSIS — R5381 Other malaise: Secondary | ICD-10-CM

## 2013-02-16 DIAGNOSIS — E669 Obesity, unspecified: Secondary | ICD-10-CM

## 2013-02-16 LAB — COMPREHENSIVE METABOLIC PANEL
ALT: 32 U/L (ref 0–35)
Albumin: 4.1 g/dL (ref 3.5–5.2)
Alkaline Phosphatase: 66 U/L (ref 39–117)
CO2: 25 mEq/L (ref 19–32)
GFR: 83.47 mL/min (ref >60.00)
Glucose, Bld: 100 mg/dL — ABNORMAL HIGH (ref 70–99)
Potassium: 4 mEq/L (ref 3.5–5.1)
Sodium: 139 mEq/L (ref 135–145)
Total Bilirubin: 0.6 mg/dL (ref 0.3–1.2)
Total Protein: 7.1 g/dL (ref 6.0–8.3)

## 2013-02-16 LAB — CBC WITH DIFFERENTIAL/PLATELET
Basophils Absolute: 0 10*3/uL (ref 0.0–0.1)
HCT: 39 % (ref 36.0–46.0)
Lymphs Abs: 1.5 10*3/uL (ref 0.7–4.0)
MCV: 89.9 fl (ref 78.0–100.0)
Monocytes Absolute: 0.4 10*3/uL (ref 0.1–1.0)
Neutrophils Relative %: 62.2 % (ref 43.0–77.0)
Platelets: 274 10*3/uL (ref 150.0–400.0)
RDW: 12.9 % (ref 11.5–14.6)

## 2013-02-16 LAB — LIPID PANEL
Cholesterol: 158 mg/dL (ref 0–200)
Triglycerides: 90 mg/dL (ref 0.0–149.0)

## 2013-02-16 MED ORDER — LISINOPRIL-HYDROCHLOROTHIAZIDE 20-12.5 MG PO TABS: 1.0000 | ORAL_TABLET | Freq: Every day | ORAL | Status: DC

## 2013-02-16 MED ORDER — ROSUVASTATIN CALCIUM 10 MG PO TABS: 10.0000 mg | ORAL_TABLET | Freq: Every day | ORAL | Status: DC

## 2013-02-16 MED ORDER — ALPRAZOLAM 0.25 MG PO TABS
0.2500 mg | ORAL_TABLET | Freq: Two times a day (BID) | ORAL | Status: DC | PRN
Start: 2013-02-16 — End: 2014-01-02

## 2013-02-16 NOTE — Assessment & Plan Note (Signed)
Continue current medication regimen of lisinopril-HCTZ. Check metabolic panel

## 2013-02-16 NOTE — Assessment & Plan Note (Signed)
Patient is actively trying to lose weight. I believe her depression and anxiety may be somewhat hindering her weight loss. She has joined [employer] and is going at least 2-3 times a week. I have provided her with Dr. Tullo's diet plan. Check thyroid. 

## 2013-02-16 NOTE — Patient Instructions (Addendum)
  Please have labs drawn prior to leaving the office.  We will contact you once the results are available.  I have provided you with a copy of Dr. Melina Schools diet plan.  Continue to exercise and follow diet for best results.  I have sent refills for your medication to the pharmacy.  Please schedule your physical with Dr. Darrick Huntsman.

## 2013-02-16 NOTE — Progress Notes (Signed)
  Subjective:    Patient ID: Jessica Clayton, female    DOB: Jan 10, 1961, 52 y.o.   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 She has a history of hypertension, hyperlipidemia, obesity, anxiety. Jessica Clayton has completely run out of her medications. She has not been seen in clinic in approximately one year. She reports feeling episodes of anxiety and mild depression but states that being at work helps. She reports recent death of a niece in law that was quite unexpected. This has affected her greatly. Jessica Clayton reports that she is trying to lose weight and just cannot seem to accomplish this. She has joined [employer] and is going several times a week. Work has been quite stressful and now there's a possibility that they may move the telemetry off site. She states that she would have to reapply for the position. She feels she is managing her stress as well as she possibly can but has days that are more difficult than others.   Review of Systems  Respiratory: Negative.   Cardiovascular: Negative.   Gastrointestinal: Negative.   Genitourinary: Negative.   Psychiatric/Behavioral: The patient is nervous/anxious.   All other systems reviewed and are negative.   BP 122/84  Pulse 56  Temp(Src) 97.9 F (36.6 C) (Oral)  Resp 12  Wt 224 lb (101.606 kg)  BMI 38.43 kg/m2  SpO2 98%    Objective:   Physical Exam  Constitutional: She is oriented to person, place, and time.  Obese, pleasant female in no apparent distress  HENT:  Head: Normocephalic and atraumatic.  Eyes: Conjunctivae are normal. Pupils are equal, round, and reactive to light.  Cardiovascular: Normal rate, regular rhythm, normal heart sounds and intact distal pulses.  Exam reveals no gallop and no friction rub.   No murmur heard. Pulmonary/Chest: Effort normal and breath sounds normal. No respiratory distress. She has no wheezes.  She has no rales.  Abdominal: Soft. Bowel sounds are normal.  Musculoskeletal: Normal range of motion.  Lymphadenopathy:    She has no cervical adenopathy.  Neurological: She is alert and oriented to person, place, and time.  Skin: Skin is warm and dry.  Psychiatric: She has a normal mood and affect. Her behavior is normal. Judgment and thought content normal.       Assessment & Plan:

## 2013-02-16 NOTE — Assessment & Plan Note (Deleted)
Patient is actively trying to lose weight. I believe her depression and anxiety may be somewhat hindering her weight loss. She has joined [employer] and is going at least 2-3 times a week. I have provided her with Dr. Melina Schools diet plan. Check thyroid.

## 2013-02-16 NOTE — Assessment & Plan Note (Signed)
Currently on Crestor 10 mg daily. Refills provided. Check lipid panel

## 2013-02-16 NOTE — Assessment & Plan Note (Addendum)
Episodes of feeling depressed and some anxiety. She has been taking Xanax 0.25 mg daily as needed. She reports not taking this on a daily basis. Patient has family history of drug dependence. She reports "I am very careful because I never want to be like my family-dependent on any medication". Discussed using medication for depression such as an SSRI to better help control her symptoms. She will think about it and discuss further at her physical with PCP. Check thyroid.

## 2013-02-21 ENCOUNTER — Encounter: Payer: Self-pay | Admitting: *Deleted

## 2013-03-17 ENCOUNTER — Encounter: Payer: 59 | Admitting: Internal Medicine

## 2013-03-21 ENCOUNTER — Telehealth: Payer: Self-pay | Admitting: *Deleted

## 2013-03-21 ENCOUNTER — Emergency Department: Payer: Self-pay | Admitting: Emergency Medicine

## 2013-03-21 LAB — CBC
HGB: 14.3 g/dL (ref 12.0–16.0)
MCH: 29.4 pg (ref 26.0–34.0)
MCV: 86 fL (ref 80–100)
RBC: 4.85 10*6/uL (ref 3.80–5.20)

## 2013-03-21 LAB — COMPREHENSIVE METABOLIC PANEL
Albumin: 4.3 g/dL (ref 3.4–5.0)
Anion Gap: 7 (ref 7–16)
BUN: 11 mg/dL (ref 7–18)
Bilirubin,Total: 0.4 mg/dL (ref 0.2–1.0)
Calcium, Total: 9.2 mg/dL (ref 8.5–10.1)
Chloride: 99 mmol/L (ref 98–107)
EGFR (Non-African Amer.): >60
Glucose: 92 mg/dL (ref 65–99)
Osmolality: 267 (ref 275–301)
Potassium: 3.3 mmol/L — ABNORMAL LOW (ref 3.5–5.1)
Total Protein: 7.8 g/dL (ref 6.4–8.2)

## 2013-03-21 LAB — TROPONIN I: Troponin-I: <0.02 ng/mL

## 2013-03-21 LAB — TSH: Thyroid Stimulating Horm: 0.246 u[IU]/mL — ABNORMAL LOW

## 2013-03-21 NOTE — Telephone Encounter (Signed)
Patient advised as requested 

## 2013-03-21 NOTE — Telephone Encounter (Signed)
Ok to start cardizem ASAP.  ,  Dr Juliann Pares is very good cardiologist but I understand if she wants me to refer to [place].

## 2013-03-21 NOTE — Telephone Encounter (Signed)
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 Dr. Juliann Pares on call and has an appointment with him but would rather see you and be scheduled with [facility] patient has appointment scheduled 03/23/13 with Dr. Darrick Huntsman. Patient would also like to know if you would want her to fill the Cardizem Sending for ED report.Marland Kitchen

## 2013-03-23 ENCOUNTER — Encounter: Payer: Self-pay | Admitting: Internal Medicine

## 2013-03-23 ENCOUNTER — Ambulatory Visit (INDEPENDENT_AMBULATORY_CARE_PROVIDER_SITE_OTHER): Payer: 59 | Admitting: Internal Medicine

## 2013-03-23 VITALS — BP 106/68 | HR 53 | Temp 98.6°F | Resp 14 | Wt 220.5 lb

## 2013-03-23 DIAGNOSIS — I10 Essential (primary) hypertension: Secondary | ICD-10-CM

## 2013-03-23 DIAGNOSIS — R079 Chest pain, unspecified: Secondary | ICD-10-CM

## 2013-03-23 DIAGNOSIS — I480 Paroxysmal atrial fibrillation: Secondary | ICD-10-CM

## 2013-03-23 DIAGNOSIS — I4891 Unspecified atrial fibrillation: Secondary | ICD-10-CM

## 2013-03-23 MED ORDER — POTASSIUM CHLORIDE CRYS ER 20 MEQ PO TBCR: 20.0000 meq | EXTENDED_RELEASE_TABLET | Freq: Every day | ORAL | Status: DC

## 2013-03-23 MED ORDER — LISINOPRIL-HYDROCHLOROTHIAZIDE 20-12.5 MG PO TABS: ORAL_TABLET | ORAL | Status: DC

## 2013-03-23 NOTE — Assessment & Plan Note (Signed)
Likely secondary to strain.  Negative stress test 2013,

## 2013-03-23 NOTE — Assessment & Plan Note (Addendum)
She has had relative hypotension since starting cardizem.  Reduce dose to 1/2 tablet daily

## 2013-03-23 NOTE — Patient Instructions (Addendum)
Reduce your lisinopril to 1/2 tablet daily,  Or completely stop it if you cannot break it in half  Then have the staff check BP in one week   Goal is 130/80  Or less   Continue the crestor and the cardizem .  You need to take a potassium supplement for 3 days only    We also have the option of changing the cardizem to short acting and take it "as needed" but discuss with Mariah Milling   You can try giving up all caffeine ,  This may help  If you develop fluid retention ,  Let me know   Referral to Dr Mariah Milling for ECHO

## 2013-03-23 NOTE — Progress Notes (Signed)
Patient ID: Jessica Clayton, female   DOB: 13-May-1961, 52 y.o.   MRN: [id number]  Patient Active Problem List   Diagnosis Date Noted  . Paroxysmal atrial fibrillation 03/23/2013  . Laryngospasm 03/01/2012  . Pulmonary nodule, right 03/01/2012  . Hyperlipidemia LDL goal < 100 02/05/2012  . Chest pain at rest 02/05/2012  . Hypertension 05/15/2011  . Anxiety 05/15/2011  . Obesity (BMI 30-39.9) 05/15/2011  . Headache, variant migraine 04/18/2011  . Otitis media 04/18/2011  . Screening for cervical cancer 04/18/2011  . Screening for breast cancer 04/18/2011  . duew Screening for colon cancer 04/18/2011    Subjective:  CC:   Chief Complaint  Patient presents with  . Acute Visit    HPI:   Jessica Clayton a 52 y.o. female who presents for ER  follow up after being seen and treated on 8/4 for palpitations and chest pain with PAF diagnosed. Potassium was 3.3  Mg normal , cardiac enzymes negative.  TSH was normal 02/16/13.  .  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  spontaneously converted before any meds were given so she was observed for a few hours and sent home with cardizem LA which she did not start until Monday evening. Given referral to see [facility] but wants to see [facility]   Recent hx: Had been working night shift and was having recurrent palpitations whenever tried to lie down.  1 day PTA she developed right sided chest wall pain along with the flutters .  On Sunday the chest pain  apread to the left chdst wall and left arm.  She decided to strpa on a monitor while at work  And captured a run of symptomatic atrial fib with RVR to 180.  Went to ER . Prior admission July 2013 for chest pain noncardiac by noninvasive workup  Has cut back on caffeine as of a month ago, caffeine free soda,  But continues to drink large amounts of  caffeinated tea twice daily    Past Medical History  Diagnosis Date  . Arthritis      left shoulder, injected by Reita Chard 2012  . Menopause, premature     at age 75  . Hypertension   . Hyperlipidemia     hypertriglyceridemia  . Obesity (BMI 30-39.9)     Past Surgical History  Procedure Laterality Date  . Cholecystectomy  1982       The following portions of the patient's history were reviewed and updated as appropriate: Allergies, current medications, and problem list.    Review of Systems:   12 Pt  review of systems was negative except those addressed in the HPI,     History   Social History  . Marital Status: Divorced    Spouse Name: N/A    Number of Children: N/A  . Years of Education: N/A   Occupational History  . Not on file.   Social History Main Topics  . Smoking status: Never Smoker   . Smokeless tobacco: Never Used  . Alcohol Use: No  . Drug Use: No  . Sexually Active: Not on file   Other Topics Concern  . Not on file   Social History Narrative  . No narrative on file    Objective:  BP 106/68  Pulse 53  Temp(Src) 98.6 F (37 C) (Oral)  Resp 14  Wt 220 lb 8 oz (100.018 kg)  BMI 37.83 kg/m2  SpO2 98%  General appearance: alert, cooperative and appears stated age Ears: normal TM's and external ear canals both ears Throat: lips, mucosa, and tongue normal; teeth and gums normal Neck: no adenopathy, no carotid bruit, supple, symmetrical, trachea midline and thyroid not enlarged, symmetric, no tenderness/mass/nodules Back: symmetric, no curvature. ROM normal. No CVA tenderness. Lungs: clear to auscultation bilaterally Heart: regular rate and rhythm, S1, S2 normal, no murmur, click, rub or gallop Abdomen: soft, non-tender; bowel sounds normal; no masses,  no organomegaly Pulses: 2+ and symmetric Skin: Skin color, texture, turgor normal. No rashes or lesions Lymph nodes: Cervical, supraclavicular, and axillary nodes normal.  Assessment and Plan:  Paroxysmal atrial fibrillation With spontaneous conversion in ER prior  to treatment.  Has been taking cardizem LA 120 mg for the last two days and BP is a little low and pulse 53.  Will reduce her lisinopril/hct and recheck in one week at work. TSH and Mg were normal per review of ER records but potassium at 3.3 was not replaced so I have done this today.  Referral to Doss[facility]bour for evla with ECHO.   Hypertension She has had relative hypotension since starting cardizem.  Reduce dose to 1/2 tablet daily   Chest pain at rest Likely secondary to strain.  Negative stress test 2013,    A total of 40 minutes was spent with patient more than half of which was spent in counseling, reviewing records from other prviders and coordination of care.   Updated Medication List Outpatient Encounter Prescriptions as of 03/23/2013  Medication Sig Dispense Refill  . acetaminophen (TYLENOL) 325 MG tablet Take 650 mg by mouth every 6 (six) hours as needed.        . ALPRAZolam (XANAX) 0.25 MG tablet Take 1 tablet (0.25 mg total) by mouth 2 (two) times daily as needed for sleep.  30 tablet  0  . lisinopril-hydrochlorothiazide (PRINZIDE,ZESTORETIC) 20-12.5 MG per tablet 1/2 tablet daily  30 tablet  6  . Multiple Vitamin (MULTIVITAMIN) tablet Take 1 tablet by mouth daily.        . nitroGLYCERIN (NITROSTAT) 0.4 MG SL tablet Place 0.4 mg under the tongue every 5 (five) minutes as needed for chest pain.      . rosuvastatin (CRESTOR) 10 MG tablet Take 1 tablet (10 mg total) by mouth daily.  30 tablet  6  . [DISCONTINUED] lisinopril-hydrochlorothiazide (PRINZIDE,ZESTORETIC) 20-12.5 MG per tablet Take 1 tablet by mouth daily.  30 tablet  6  . potassium chloride SA (K-DUR,KLOR-CON) 20 MEQ tablet Take 1 tablet (20 mEq total) by mouth daily.  30 tablet  0   No facility-administered encounter medications on file as of 03/23/2013.

## 2013-03-23 NOTE — Assessment & Plan Note (Signed)
With spontaneous conversion in ER prior to treatment.  Has been taking cardizem LA 120 mg for the last two days and BP is a little low and pulse 53.  Will reduce her lisinopril/hct and recheck in one week at work. TSH and Mg were normal per review of ER records but potassium at 3.3 was not replaced so I have done this today.  Referral to [facility]oss[facility] Arbour for evla with ECHO.

## 2013-03-24 ENCOUNTER — Encounter: Payer: Self-pay | Admitting: Emergency Medicine

## 2013-03-29 ENCOUNTER — Ambulatory Visit: Payer: 59 | Admitting: Internal Medicine

## 2013-04-08 ENCOUNTER — Encounter: Payer: Self-pay | Admitting: Cardiovascular Disease

## 2013-04-08 ENCOUNTER — Ambulatory Visit (INDEPENDENT_AMBULATORY_CARE_PROVIDER_SITE_OTHER): Payer: 59 | Admitting: Cardiovascular Disease

## 2013-04-08 VITALS — BP 128/68 | HR 62 | Ht 64.0 in | Wt 223.5 lb

## 2013-04-08 DIAGNOSIS — E785 Hyperlipidemia, unspecified: Secondary | ICD-10-CM

## 2013-04-08 DIAGNOSIS — I480 Paroxysmal atrial fibrillation: Secondary | ICD-10-CM

## 2013-04-08 DIAGNOSIS — E669 Obesity, unspecified: Secondary | ICD-10-CM

## 2013-04-08 DIAGNOSIS — R0789 Other chest pain: Secondary | ICD-10-CM

## 2013-04-08 DIAGNOSIS — R0602 Shortness of breath: Secondary | ICD-10-CM

## 2013-04-08 DIAGNOSIS — I4892 Unspecified atrial flutter: Secondary | ICD-10-CM

## 2013-04-08 DIAGNOSIS — I4891 Unspecified atrial fibrillation: Secondary | ICD-10-CM

## 2013-04-08 MED ORDER — FUROSEMIDE 20 MG PO TABS: 20.0000 mg | ORAL_TABLET | Freq: Every day | ORAL | Status: DC

## 2013-04-08 MED ORDER — DILTIAZEM HCL 30 MG PO TABS: 30.0000 mg | ORAL_TABLET | Freq: Four times a day (QID) | ORAL | Status: DC | PRN

## 2013-04-08 MED ORDER — LISINOPRIL 10 MG PO TABS: 10.0000 mg | ORAL_TABLET | Freq: Every day | ORAL | Status: DC

## 2013-04-08 NOTE — Progress Notes (Signed)
Patient ID: Jessica Clayton, female    DOB: 1961-06-26, 52 y.o.   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  She reports that approximately 2 weeks ago, she developed palpitations. Symptoms came off-and-on. On August 4, she had significant symptoms while at work. She set up a telemetry monitor for herself which showed atrial fibrillation. Symptoms seem to come and go. She is relatively symptomatically. She went to the emergency room with EKG showing atrial fibrillation with RVR, rate 152 beats per minute. Potassium at that time was 3.3. She was started on Cardizem 120 mg daily.   Since then she reports that she has had additional episodes on August 17 lasting for several hours (Sunday while at home) Also additional episodes last night with fluttering. She does report some occasional shortness of breath, worse after episodes of atrial fibrillation. Denies any chest pain  Notes show stress test in June 2013 showing no ischemia, normal ejection fraction March 2014 ultrasound of the leg done to rule out DVT. No thrombus noted  EKG today shows normal sinus rhythm with rate 62 beats per minute with no significant ST-T wave changes     Outpatient Encounter Prescriptions as of 04/08/2013  Medication Sig Dispense Refill  . acetaminophen (TYLENOL) 325 MG tablet Take 650 mg by mouth every 6 (six) hours as needed.        . ALPRAZolam (XANAX) 0.25 MG tablet Take 1 tablet (0.25 mg total) by mouth 2 (two) times daily as needed for sleep.  30 tablet  0  . aspirin 81 MG tablet Take 81 mg by mouth daily.      Marland Kitchen diltiazem (CARDIZEM) 120 MG tablet Take 120 mg by mouth daily.      . Multiple Vitamin (MULTIVITAMIN) tablet Take 1 tablet by mouth daily.        . nitroGLYCERIN (NITROSTAT) 0.4 MG SL tablet Place 0.4 mg  under the tongue every 5 (five) minutes as needed for chest pain.      . potassium chloride SA (K-DUR,KLOR-CON) 20 MEQ tablet Take 1 tablet (20 mEq total) by mouth daily.  30 tablet  0  . rosuvastatin (CRESTOR) 10 MG tablet Take 1 tablet (10 mg total) by mouth daily.  30 tablet  6  .  lisinopril-hydrochlorothiazide (PRINZIDE,ZESTORETIC) 20-12.5 MG per tablet 1/2 tablet daily  30 tablet  6   Review of Systems  Constitutional: Negative.   HENT: Negative.   Eyes: Negative.   Respiratory: Positive for shortness of breath.   Cardiovascular: Positive for palpitations.  Gastrointestinal: Negative.   Musculoskeletal: Negative.   Skin: Negative.   Neurological: Negative.   Psychiatric/Behavioral: Negative.   All other systems reviewed and are negative.    BP 128/68  Pulse 62  Ht 5\' 4"  (1.626 m)  Wt 223 lb 8 oz (101.379 kg)  BMI 38.34 kg/m2  Physical Exam  Nursing note and vitals reviewed. Constitutional: She is oriented to person, place, and time. She appears well-developed and well-nourished.  HENT:  Head: Normocephalic.  Nose: Nose normal.  Mouth/Throat: Oropharynx is clear and moist.  Eyes: Conjunctivae are normal. Pupils are equal, round, and reactive to light.  Neck: Normal range of motion. Neck supple. No JVD present.  Cardiovascular: Normal rate, regular rhythm, S1 normal, S2 normal, normal heart sounds and intact distal pulses.  Exam reveals  no gallop and no friction rub.   No murmur heard. Pulmonary/Chest: Effort normal and breath sounds normal. No respiratory distress. She has no wheezes. She has no rales. She exhibits no tenderness.  Abdominal: Soft. Bowel sounds are normal. She exhibits no distension. There is no tenderness.  Musculoskeletal: Normal range of motion. She exhibits no edema and no tenderness.  Lymphadenopathy:    She has no cervical adenopathy.  Neurological: She is alert and oriented to person, place, and time. Coordination normal.  Skin: Skin is warm and  dry. No rash noted. No erythema.  Psychiatric: She has a normal mood and affect. Her behavior is normal. Judgment and thought content normal.    Assessment and Plan

## 2013-04-08 NOTE — Assessment & Plan Note (Signed)
We have encouraged continued exercise, careful diet management in an effort to lose weight. 

## 2013-04-08 NOTE — Assessment & Plan Note (Addendum)
Improved number of episodes since August 5 on the diltiazem 120 mg daily. Blood pressure and heart rate are lower. Potassium was very low on August 5 possibly contributing to arrhythmia. Currently she takes potassium daily. We have suggested she stop the HCTZ and this weekend could hold her potassium. I'm concerned that diltiazem 180 mg daily might cause bradycardia and fatigue. I suggested she use diltiazem 30 mg when necessary for breakthrough atrial fibrillation. If she has frequent recurrent episodes, we could add flecainide. Beta blocker would likely cause bradycardia and fatigue. Would need an echocardiogram prior to starting antiarrhythmics. We'll see if this can be set up in the hospital. If not will do in the office. We'll see her back in close followup.

## 2013-04-08 NOTE — Patient Instructions (Addendum)
Please stop the lisinopril HCTZ Continue lisinopril 1 a day (10 mg)  Hold the potassium on Sunday   Continue on cardizem 120 mg daily Take diltiazem 30 mg tab as needed for atrial fibrillation  If you continue to go in and out of atrial fibrillation, Start xarelto one a day  If you feel shortness of breath, or have worsening, take a lasix 20 mg as needed, take with potassium  Please call us if you have new issues that need to be addressed before your next appt.  Your physician wants you to follow-up in: 1 month.

## 2013-04-08 NOTE — Assessment & Plan Note (Signed)
Cholesterol is at goal on the current lipid regimen. No changes to the medications were made.  

## 2013-05-09 ENCOUNTER — Encounter: Payer: Self-pay | Admitting: *Deleted

## 2013-05-09 ENCOUNTER — Ambulatory Visit: Payer: 59 | Admitting: Cardiovascular Disease

## 2013-05-12 ENCOUNTER — Encounter: Payer: Self-pay | Admitting: *Deleted

## 2013-05-13 ENCOUNTER — Encounter: Payer: Self-pay | Admitting: Internal Medicine

## 2013-05-13 ENCOUNTER — Ambulatory Visit (INDEPENDENT_AMBULATORY_CARE_PROVIDER_SITE_OTHER): Payer: 59 | Admitting: Internal Medicine

## 2013-05-13 VITALS — BP 118/74 | HR 57 | Temp 98.1°F | Resp 14 | Ht 64.5 in | Wt 222.0 lb

## 2013-05-13 DIAGNOSIS — G4710 Hypersomnia, unspecified: Secondary | ICD-10-CM

## 2013-05-13 DIAGNOSIS — Z23 Encounter for immunization: Secondary | ICD-10-CM

## 2013-05-13 DIAGNOSIS — E876 Hypokalemia: Secondary | ICD-10-CM

## 2013-05-13 DIAGNOSIS — I10 Essential (primary) hypertension: Secondary | ICD-10-CM

## 2013-05-13 DIAGNOSIS — I480 Paroxysmal atrial fibrillation: Secondary | ICD-10-CM

## 2013-05-13 DIAGNOSIS — Z0000 Encounter for general adult medical examination without abnormal findings: Secondary | ICD-10-CM

## 2013-05-13 DIAGNOSIS — E669 Obesity, unspecified: Secondary | ICD-10-CM

## 2013-05-13 DIAGNOSIS — Z800 Family history of malignant neoplasm of digestive organs: Secondary | ICD-10-CM

## 2013-05-13 DIAGNOSIS — Z1239 Encounter for other screening for malignant neoplasm of breast: Secondary | ICD-10-CM

## 2013-05-13 DIAGNOSIS — R079 Chest pain, unspecified: Secondary | ICD-10-CM

## 2013-05-13 DIAGNOSIS — I4891 Unspecified atrial fibrillation: Secondary | ICD-10-CM

## 2013-05-13 DIAGNOSIS — Z79899 Other long term (current) drug therapy: Secondary | ICD-10-CM

## 2013-05-13 DIAGNOSIS — Z1211 Encounter for screening for malignant neoplasm of colon: Secondary | ICD-10-CM

## 2013-05-13 DIAGNOSIS — R0683 Snoring: Secondary | ICD-10-CM

## 2013-05-13 DIAGNOSIS — Z124 Encounter for screening for malignant neoplasm of cervix: Secondary | ICD-10-CM

## 2013-05-13 DIAGNOSIS — R0609 Other forms of dyspnea: Secondary | ICD-10-CM

## 2013-05-13 LAB — BASIC METABOLIC PANEL
Chloride: 103 mEq/L (ref 96–112)
Creatinine, Ser: 0.9 mg/dL (ref 0.4–1.2)
Sodium: 140 mEq/L (ref 135–145)

## 2013-05-13 LAB — HEPATIC FUNCTION PANEL
ALT: 32 U/L (ref 0–35)
Alkaline Phosphatase: 72 U/L (ref 39–117)
Bilirubin, Direct: 0 mg/dL (ref 0.0–0.3)
Total Bilirubin: 0.7 mg/dL (ref 0.3–1.2)

## 2013-05-13 NOTE — Assessment & Plan Note (Addendum)
Normal screening colonoscopy per Mechele Collin.  Return in 5 yrs. For  FH of colon ca

## 2013-05-13 NOTE — Progress Notes (Signed)
Patient ID: Jessica Clayton, female   DOB: June 25, 1961, 52 y.o.   MRN: [id number]   Subjective:     Jessica Clayton is a 52 y.o. female and is here for a comprehensive physical exam. The patient reports  Recurrent  episodes of chest discomfort and palpitations brought on by walking and physical labor (yardwork). While at work has had palpitations evaluated with telemtery , no PAF seen,  At times her HR is in the 60's when she is feeling the palpitations.  She was going to [employer] 3/wk for while ,  Had worked herself up to 45 minutes on treadmill with a rate of  3.5 miles/hr but has not gone in several weeks   Reports that she wakes up gasping for air,  Having trouble staying a wake during the day, and history of snoring and obesity. Thinks she may have sleep apnea  Obesity:  Reviewed her diet.  She has elminiated "jumk food" but is restricting carbohydrates or calories yet. No weight loss  .   History   Social History  . Marital Status: Divorced    Spouse Name: N/A    Number of Children: N/A  . Years of Education: N/A   Occupational History  . Not on file.   Social History Main Topics  . Smoking status: Never Smoker   . Smokeless tobacco: Never Used  . Alcohol Use: No  . Drug Use: No  . Sexual Activity: Not on file   Other Topics Concern  . Not on file   Social History Narrative  . No narrative on file   Health Maintenance  Topic Date Due  . Influenza Vaccine  03/18/2014  . Mammogram  07/13/2014  . Pap Smear  09/12/2014  . Colonoscopy  11/05/2021  . Tetanus/tdap  05/14/2023    The following portions of the patient's history were reviewed and updated as appropriate: allergies, current medications, past family history, past medical history, past social history, past surgical history and problem list.  Review of Systems A comprehensive review of systems was negative.   Objective:   BP 118/74  Pulse 57  Temp(Src) 98.1 F (36.7 C) (Oral)  Resp 14  Ht 5' 4.5" (1.638 m)   Wt 222 lb (100.699 kg)  BMI 37.53 kg/m2  SpO2 98%  General Appearance:    Alert, cooperative, no distress, appears stated age  Head:    Normocephalic, without obvious abnormality, atraumatic  Eyes:    PERRL, conjunctiva/corneas clear, EOM's intact, fundi    benign, both eyes  Ears:    Normal TM's and external ear canals, both ears  Nose:   Nares normal, septum midline, mucosa normal, no drainage    or sinus tenderness  Throat:   Lips, mucosa, and tongue normal; teeth and gums normal  Neck:   Supple, symmetrical, trachea midline, no adenopathy;    thyroid:  no enlargement/tenderness/nodules; no carotid   bruit or JVD  Back:     Symmetric, no curvature, ROM normal, no CVA tenderness  Lungs:     Clear to auscultation bilaterally, respirations unlabored  Chest Wall:    No tenderness or deformity   Heart:    Regular rate and rhythm, S1 and S2 normal, no murmur, rub   or gallop  Breast Exam:    No tenderness, masses, or nipple abnormality  Abdomen:     Soft, non-tender, bowel sounds active all four quadrants,    no masses, no organomegaly     Extremities:   Extremities normal, atraumatic,  no cyanosis or edema  Pulses:   2+ and symmetric all extremities  Skin:   Skin color, texture, turgor normal, no rashes or lesions  Lymph nodes:   Cervical, supraclavicular, and axillary nodes normal  Neurologic:   CNII-XII intact, normal strength, sensation and reflexes    throughout    Assessment:   Screening for colon cancer Normal screening colonoscopy per Mechele Collin.  Return in 5 yrs. For  FH of colon ca    Chest pain at rest Has not occurred during prior exercise.  Reassurance provided. Follow up with Dossie Arbour for ECHO as advised.   Hypertension Well controlled on current regimen. Renal function stable, no changes today.  Paroxysmal atrial fibrillation Continue 120 mg diltiazem daily and prn short acting cardizem 30 mg dose for tachycardia.  Avoiding beta blocker secondary to concerns  for increased fatigue.  Electrolytes normal.  Screening for cervical cancer Up to date with screening.  Next one due 2015   Family history of colon cancer requiring screening colonoscopy Normal per Mechele Collin; 5 yr follow up recommended.   Obesity (BMI 30-39.9) I have addressed  BMI and recommended wt loss of 10% of body weight over the next 6 months using a low glycemic index diet and regular aerobic exercise a minimum of 5 days per week.    Snoring disorder Nocturnal wakening with gasping reported.  Has hypertension obesity and PAF all of which are concerning for untreated OSA>  Sleep Study ordered.   Annual physical exam Annual comprehensive exam was done including breast, pelvic and PAP smear. All screenings have been addressed .    Updated Medication List Outpatient Encounter Prescriptions as of 05/13/2013  Medication Sig Dispense Refill  . acetaminophen (TYLENOL) 325 MG tablet Take 650 mg by mouth every 6 (six) hours as needed.        . ALPRAZolam (XANAX) 0.25 MG tablet Take 1 tablet (0.25 mg total) by mouth 2 (two) times daily as needed for sleep.  30 tablet  0  . aspirin 81 MG tablet Take 81 mg by mouth daily.      Marland Kitchen diltiazem (CARDIZEM) 30 MG tablet Take 1 tablet (30 mg total) by mouth 4 (four) times daily as needed.  90 tablet  3  . furosemide (LASIX) 20 MG tablet Take 1 tablet (20 mg total) by mouth daily.  30 tablet  3  . lisinopril (PRINIVIL,ZESTRIL) 10 MG tablet Take 1 tablet (10 mg total) by mouth daily.  30 tablet  6  . Multiple Vitamin (MULTIVITAMIN) tablet Take 1 tablet by mouth daily.        . nitroGLYCERIN (NITROSTAT) 0.4 MG SL tablet Place 0.4 mg under the tongue every 5 (five) minutes as needed for chest pain.      . potassium chloride SA (K-DUR,KLOR-CON) 20 MEQ tablet Take 1 tablet (20 mEq total) by mouth daily.  30 tablet  0  . rosuvastatin (CRESTOR) 10 MG tablet Take 1 tablet (10 mg total) by mouth daily.  30 tablet  6  . diltiazem (CARDIZEM) 120 MG tablet Take  120 mg by mouth daily.      . Rivaroxaban (XARELTO) 20 MG TABS tablet Take by mouth daily as needed.  30 tablet     No facility-administered encounter medications on file as of 05/13/2013.

## 2013-05-13 NOTE — Patient Instructions (Addendum)
We will set you up to see Dr Mariah Milling again  I am recommending a TDaP vaccine which we can giv eyou today unless [employer] will give it to you  Sleep study  To be ordered  Repeat potassium today

## 2013-05-15 ENCOUNTER — Encounter: Payer: Self-pay | Admitting: Internal Medicine

## 2013-05-15 DIAGNOSIS — Z0000 Encounter for general adult medical examination without abnormal findings: Secondary | ICD-10-CM | POA: Insufficient documentation

## 2013-05-15 DIAGNOSIS — Z800 Family history of malignant neoplasm of digestive organs: Secondary | ICD-10-CM | POA: Insufficient documentation

## 2013-05-15 DIAGNOSIS — G4733 Obstructive sleep apnea (adult) (pediatric): Secondary | ICD-10-CM | POA: Insufficient documentation

## 2013-05-15 NOTE — Assessment & Plan Note (Signed)
Nocturnal wakening with gasping reported.  Has hypertension obesity and PAF all of which are concerning for untreated OSA>  Sleep Study ordered.

## 2013-05-15 NOTE — Assessment & Plan Note (Signed)
Normal per Mechele Collin; 5 yr follow up recommended.

## 2013-05-15 NOTE — Assessment & Plan Note (Addendum)
Has not occurred during prior exercise.  Reassurance provided. Follow up with Dossie Arbour for ECHO as advised.

## 2013-05-15 NOTE — Addendum Note (Signed)
Addended by: Sherlene Shams on: 05/15/2013 09:01 AM   Modules accepted: Level of Service

## 2013-05-15 NOTE — Assessment & Plan Note (Signed)
Well controlled on current regimen. Renal function stable, no changes today. 

## 2013-05-15 NOTE — Assessment & Plan Note (Signed)
I have addressed  BMI and recommended wt loss of 10% of body weight over the next 6 months using a low glycemic index diet and regular aerobic exercise a minimum of 5 days per week.

## 2013-05-15 NOTE — Assessment & Plan Note (Signed)
Up to date with screening.  Next one due 2015

## 2013-05-15 NOTE — Assessment & Plan Note (Signed)
Continue 120 mg diltiazem daily and prn short acting cardizem 30 mg dose for tachycardia.  Avoiding beta blocker secondary to concerns for increased fatigue.  Electrolytes normal.

## 2013-05-15 NOTE — Assessment & Plan Note (Signed)
Annual comprehensive exam was done including breast, pelvic and PAP smear. All screenings have been addressed .  

## 2013-05-17 ENCOUNTER — Encounter: Payer: Self-pay | Admitting: Cardiovascular Disease

## 2013-05-17 ENCOUNTER — Ambulatory Visit (INDEPENDENT_AMBULATORY_CARE_PROVIDER_SITE_OTHER): Payer: 59 | Admitting: Cardiovascular Disease

## 2013-05-17 VITALS — BP 110/80 | HR 52 | Ht 64.5 in | Wt 223.0 lb

## 2013-05-17 DIAGNOSIS — I4891 Unspecified atrial fibrillation: Secondary | ICD-10-CM

## 2013-05-17 DIAGNOSIS — R0683 Snoring: Secondary | ICD-10-CM

## 2013-05-17 DIAGNOSIS — I480 Paroxysmal atrial fibrillation: Secondary | ICD-10-CM

## 2013-05-17 DIAGNOSIS — R0602 Shortness of breath: Secondary | ICD-10-CM | POA: Insufficient documentation

## 2013-05-17 DIAGNOSIS — E669 Obesity, unspecified: Secondary | ICD-10-CM

## 2013-05-17 DIAGNOSIS — I10 Essential (primary) hypertension: Secondary | ICD-10-CM

## 2013-05-17 DIAGNOSIS — R0789 Other chest pain: Secondary | ICD-10-CM

## 2013-05-17 DIAGNOSIS — I4902 Ventricular flutter: Secondary | ICD-10-CM

## 2013-05-17 DIAGNOSIS — I498 Other specified cardiac arrhythmias: Secondary | ICD-10-CM

## 2013-05-17 DIAGNOSIS — R0609 Other forms of dyspnea: Secondary | ICD-10-CM

## 2013-05-17 NOTE — Assessment & Plan Note (Signed)
Blood pressure seems to be well controlled on lisinopril 10 mg daily. Holding HCTZ given low potassium in the past

## 2013-05-17 NOTE — Patient Instructions (Addendum)
You are doing well. Ok to hold the cardizem 120 mg pill (as you are doing) Just take the diltiazem 30 mg pill as needed for palpitations Lasix as needed for shortness of breath of ankle swelling  Monitor your rhythm at work on telemetry OK to exercise If you have continued shortness of breath, call the office for possible treadmill study  Please call us if you have new issues that need to be addressed before your next appt.  Your physician wants you to follow-up in: 6 months.  You will receive a reminder letter in the mail two months in advance. If you don't receive a letter, please call our office to schedule the follow-up appointment.

## 2013-05-17 NOTE — Assessment & Plan Note (Signed)
Family member's report she might have significant snoring, possible sleep apnea. Given recent weight gain of 50 pounds over the past several years, she would be at risk of sleep apnea. She reports that she will have a sleep study done. She reports that he has been ordered by Dr. Darrick Huntsman. Weight and snoring/sleep apnea could be contributing to arrhythmia

## 2013-05-17 NOTE — Assessment & Plan Note (Signed)
We have encouraged continued exercise, careful diet management in an effort to lose weight. 

## 2013-05-17 NOTE — Assessment & Plan Note (Signed)
We spent much of her time talking about her breathing and seems to present with walking. I'm concerned about her 50 pound weight gain over the past several years. She's not very happy in general. This seems to be causing sleep disorder. Despite Xanax, she continues to wake every 2 hours. Sleep study has been recommended. We've recommended a regular walking regimen, strict diet control.

## 2013-05-17 NOTE — Assessment & Plan Note (Addendum)
Some medication confusion. She's not taking Cardizem, not taking xarelto. In hindsight, this could be a good thing as her heart rate is slowed today, 52. Had she been on Cardizem, heart rate could have been very slow. Given her bradycardia, we will hold the diltiazem. She will continue to take his when necessary. She will monitor her symptoms and wear a telemetry monitor while at work. She states she will keep in close contact with our office for any additional episodes of atrial fibrillation. She is maintaining normal sinus rhythm today. Symptoms of rapid fluttering have not been occurring recently. We will hold the xarelto for now. Possible at her low potassium was contributing to her arrhythmia. If she continues to have paroxysmal atrial fibrillation, we would need probably an antiarrhythmic. Would need echocardiogram prior to starting this.

## 2013-05-17 NOTE — Progress Notes (Signed)
Patient ID: Jessica Clayton, female    DOB: Dec 06, 1960, 52 y.o.   MRN: [id number]  HPI Comments: 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  . On March 21 2013, she had significant symptoms of palpitations  while at work. She set up a telemetry monitor for herself which showed atrial fibrillation. Symptoms seem to come and go. She was relatively symptomatically. She went to the emergency room with EKG showing atrial fibrillation with RVR, rate 152 beats per minute. Potassium at that time was 3.3. She was started on Cardizem 120 mg daily.   Since then she reports that she has had additional episodes on April 03 2013 lasting for several hours (Sunday while at home) On her last clinic visit, we recommended that she stay on Cardizem 120 mg daily with the xarelto and take diltiazem 30 mg when necessary for atrial fibrillation episodes.    on today's visit, she's not taking the Cardizem. She is taking diltiazem 30 mg daily. She's not taking any anticoagulation  (no xarelto).  she denies any significant fluttering as she had in the past. Occasional episodes of palpitations have turned out to be normal sinus rhythm with heart rates in the 50s when she walks up a telemetry monitor on herself at work.) She does report having some shortness of breath, light tightening in the chest with walking. She is uncertain if this is from command his weight gain over the past several years.  She reports that she went to [employer] to work out last week and exercised on the treadmill for 30 minutes with no significant symptoms. Slight shortness of breath and tightening with heavy walking.    previous stress test in June 2013 showing no ischemia, normal ejection fraction March 2014 ultrasound of the leg done to rule  out DVT. No thrombus noted  EKG today shows normal sinus rhythm with rate 52 beats per minute with no significant ST-T wave changes     Outpatient Encounter Prescriptions as of 05/17/2013  Medication Sig Dispense Refill  . acetaminophen (TYLENOL) 325 MG tablet Take 650 mg by mouth every 6 (six) hours as needed.        . ALPRAZolam (XANAX) 0.25 MG tablet Take 1 tablet (0.25 mg total) by mouth 2 (two) times daily as needed for sleep.  30 tablet  0  . aspirin 81 MG tablet Take 81 mg by mouth daily.      Marland Kitchen diltiazem (CARDIZEM) 30 MG tablet Take 1 tablet (30 mg total) by mouth  She is taking this once a day   90 tablet  3  . furosemide (LASIX) 20 MG tablet Take 1 tablet (20 mg total) by mouth daily as needed.  30 tablet  3  . lisinopril (PRINIVIL,ZESTRIL) 10 MG tablet Take 1 tablet (10 mg total) by mouth daily.  30 tablet  6  . Multiple Vitamin (MULTIVITAMIN) tablet Take 1 tablet by mouth daily.        . nitroGLYCERIN (NITROSTAT) 0.4 MG SL tablet Place 0.4 mg under the tongue every 5 (five) minutes as needed for chest pain.      . potassium chloride SA (K-DUR,KLOR-CON) 20 MEQ tablet Take 1 tablet (20 mEq total) by mouth daily.  30 tablet  0  . rosuvastatin (CRESTOR) 10 MG tablet Take 1 tablet (10 mg  total) by mouth daily.  30 tablet  6   Review of Systems  Constitutional: Negative.   HENT: Negative.   Eyes: Negative.   Respiratory: Positive for shortness of breath.   Cardiovascular: Positive for palpitations.  Gastrointestinal: Negative.   Musculoskeletal: Negative.   Skin: Negative.   Neurological: Negative.   Psychiatric/Behavioral: Negative.   All other systems reviewed and are negative.    BP 110/80  Pulse 52  Ht 5' 4.5" (1.638 m)  Wt 223 lb (101.152 kg)  BMI 37.7 kg/m2  Physical Exam  Nursing note and vitals reviewed. Constitutional: She is oriented to person, place, and time. She appears well-developed and well-nourished.  HENT:  Head: Normocephalic.  Nose: Nose normal.   Mouth/Throat: Oropharynx is clear and moist.  Eyes: Conjunctivae are normal. Pupils are equal, round, and reactive to light.  Neck: Normal range of motion. Neck supple. No JVD present.  Cardiovascular: Normal rate, regular rhythm, S1 normal, S2 normal, normal heart sounds and intact distal pulses.  Exam reveals no gallop and no friction rub.   No murmur heard. Pulmonary/Chest: Effort normal and breath sounds normal. No respiratory distress. She has no wheezes. She has no rales. She exhibits no tenderness.  Abdominal: Soft. Bowel sounds are normal. She exhibits no distension. There is no tenderness.  Musculoskeletal: Normal range of motion. She exhibits no edema and no tenderness.  Lymphadenopathy:    She has no cervical adenopathy.  Neurological: She is alert and oriented to person, place, and time. Coordination normal.  Skin: Skin is warm and dry. No rash noted. No erythema.  Psychiatric: She has a normal mood and affect. Her behavior is normal. Judgment and thought content normal.    Assessment and Plan

## 2013-05-18 ENCOUNTER — Encounter: Payer: Self-pay | Admitting: *Deleted

## 2013-05-23 ENCOUNTER — Encounter: Payer: Self-pay | Admitting: Emergency Medicine

## 2013-06-21 ENCOUNTER — Ambulatory Visit (INDEPENDENT_AMBULATORY_CARE_PROVIDER_SITE_OTHER): Payer: 59 | Admitting: Adult Health

## 2013-06-21 ENCOUNTER — Encounter: Payer: Self-pay | Admitting: Adult Health

## 2013-06-21 VITALS — BP 124/78 | HR 66 | Temp 97.8°F | Resp 12 | Wt 224.5 lb

## 2013-06-21 DIAGNOSIS — J329 Chronic sinusitis, unspecified: Secondary | ICD-10-CM | POA: Insufficient documentation

## 2013-06-21 MED ORDER — AZELASTINE-FLUTICASONE 137-50 MCG/ACT NA SUSP: 1.0000 | Freq: Two times a day (BID) | NASAL | Status: DC

## 2013-06-21 MED ORDER — AMOXICILLIN-POT CLAVULANATE 875-125 MG PO TABS: 1.0000 | ORAL_TABLET | Freq: Two times a day (BID) | ORAL | Status: DC

## 2013-06-21 NOTE — Progress Notes (Signed)
  Subjective:    Patient ID: Jessica Clayton, female    DOB: August 13, 1961, 52 y.o.   MRN: [id number]  HPI  Pt reports body aches, sinus congestion, decreased energy for 1 week.  Pt states she began to feel better on Saturday and Sunday but began to feel much worse again yesterday morning. Pt denies fever or chills.   Current Outpatient Prescriptions on File Prior to Visit  Medication Sig Dispense Refill  . acetaminophen (TYLENOL) 325 MG tablet Take 650 mg by mouth every 6 (six) hours as needed.        . ALPRAZolam (XANAX) 0.25 MG tablet Take 1 tablet (0.25 mg total) by mouth 2 (two) times daily as needed for sleep.  30 tablet  0  . aspirin 81 MG tablet Take 81 mg by mouth daily.      Marland Kitchen lisinopril (PRINIVIL,ZESTRIL) 10 MG tablet Take 1 tablet (10 mg total) by mouth daily.  30 tablet  6  . Multiple Vitamin (MULTIVITAMIN) tablet Take 1 tablet by mouth daily.        . nitroGLYCERIN (NITROSTAT) 0.4 MG SL tablet Place 0.4 mg under the tongue every 5 (five) minutes as needed for chest pain.      . potassium chloride SA (K-DUR,KLOR-CON) 20 MEQ tablet Take 1 tablet (20 mEq total) by mouth daily.  30 tablet  0  . rosuvastatin (CRESTOR) 10 MG tablet Take 1 tablet (10 mg total) by mouth daily.  30 tablet  6   No current facility-administered medications on file prior to visit.     Review of Systems  Constitutional: Positive for fatigue. Negative for fever and chills.  HENT: Positive for congestion and sinus pressure.   Respiratory: Positive for cough. Negative for chest tightness, shortness of breath and wheezing.   Musculoskeletal: Positive for myalgias.       Generalized body aches       Objective:   Physical Exam  Constitutional: She is oriented to person, place, and time. She appears well-developed and well-nourished.  HENT:  Head: Normocephalic and atraumatic.  Mouth/Throat: Oropharynx is clear and moist. No oropharyngeal exudate.  Cardiovascular: Normal rate, regular rhythm and intact distal  pulses.  Exam reveals no gallop.   No murmur heard. Pulmonary/Chest: Effort normal and breath sounds normal. No respiratory distress. She has no wheezes. She has no rales.  Neurological: She is alert and oriented to person, place, and time.  Skin: Skin is warm and dry.  Psychiatric: She has a normal mood and affect. Her behavior is normal. Judgment and thought content normal.    BP 124/78  Pulse 66  Temp(Src) 97.8 F (36.6 C) (Oral)  Resp 12  Wt 224 lb 8 oz (101.833 kg)  SpO2 98%       Assessment & Plan:

## 2013-06-21 NOTE — Patient Instructions (Signed)
  Start Augmentin 1 tablet twice a day for 10 days.  Dymista nasal spray 1 spray into each nostril twice daily.  Tylenol or ibuprofen for general aches and discomfort.  Continue with Delsym for cough.  If no improvement within 4-5 days please call.

## 2013-06-21 NOTE — Assessment & Plan Note (Signed)
Initially was getting better. Now with green drainage. Start Augmentin bid x 10 days. Dymista nasal spray. RTC if not improved within 4-5 days or sooner if necessary.

## 2013-07-01 ENCOUNTER — Ambulatory Visit: Payer: Self-pay | Admitting: Internal Medicine

## 2013-07-07 ENCOUNTER — Ambulatory Visit: Payer: Self-pay | Admitting: Internal Medicine

## 2013-07-13 ENCOUNTER — Telehealth: Payer: Self-pay | Admitting: Internal Medicine

## 2013-07-13 DIAGNOSIS — G4733 Obstructive sleep apnea (adult) (pediatric): Secondary | ICD-10-CM

## 2013-07-13 NOTE — Telephone Encounter (Signed)
Left patient detailed message  On home phone.

## 2013-07-13 NOTE — Assessment & Plan Note (Signed)
Sleep study confirmed that patient has sleep apnea,  but will need a second study to determine paramters for CPAP use.  This has been ordered  

## 2013-07-13 NOTE — Telephone Encounter (Signed)
Sleep study confirmed that patient has sleep apnea,  but will need a second study to determine paramters for CPAP use.  This has been ordered  

## 2013-07-19 ENCOUNTER — Encounter: Payer: Self-pay | Admitting: Internal Medicine

## 2013-07-22 ENCOUNTER — Telehealth: Payer: Self-pay | Admitting: Internal Medicine

## 2013-07-22 NOTE — Telephone Encounter (Signed)
Left detailed message per DPR release.

## 2013-07-22 NOTE — Telephone Encounter (Signed)
Patient calling for sleep study results from 11.20.14

## 2013-07-26 NOTE — Telephone Encounter (Signed)
Patient did not receive her test results on her cell phone . She wants her results for her sleep study on her cell phone.

## 2013-07-27 NOTE — Telephone Encounter (Signed)
Left patient detailed message as requested on voicemail per DPR.

## 2013-08-04 ENCOUNTER — Encounter: Payer: Self-pay | Admitting: Internal Medicine

## 2013-08-16 ENCOUNTER — Ambulatory Visit: Payer: Self-pay | Admitting: Internal Medicine

## 2013-08-25 ENCOUNTER — Telehealth: Payer: Self-pay | Admitting: Internal Medicine

## 2013-08-25 DIAGNOSIS — G4733 Obstructive sleep apnea (adult) (pediatric): Secondary | ICD-10-CM

## 2013-09-01 ENCOUNTER — Telehealth: Payer: Self-pay | Admitting: *Deleted

## 2013-09-01 NOTE — Telephone Encounter (Signed)
She has sleep apnea,  She was notified by you on 11?26.  Then She had the second study which was the  titration study ,  And the mask has been ordered.  If she has not received the CPAP mask please call the company and find out why it takes over a month

## 2013-09-01 NOTE — Telephone Encounter (Signed)
Patient requesting sleep study results, Please advise

## 2013-09-02 NOTE — Telephone Encounter (Signed)
Left message for patient to return call to office. 

## 2013-09-19 ENCOUNTER — Encounter: Payer: Self-pay | Admitting: Internal Medicine

## 2014-01-02 ENCOUNTER — Other Ambulatory Visit: Payer: Self-pay | Admitting: Internal Medicine

## 2014-01-02 NOTE — Telephone Encounter (Signed)
Refill? Last non acute 9/14

## 2014-01-03 NOTE — Telephone Encounter (Signed)
Refill one 30 days only.  Has not been seen in over 6 months so needs office visit prior to any more refills 

## 2014-01-04 NOTE — Telephone Encounter (Signed)
Left message, notifying of need for appointment prior to next refill. Rx faxed to pharmacy.

## 2014-01-30 ENCOUNTER — Other Ambulatory Visit: Payer: Self-pay | Admitting: Cardiovascular Disease

## 2014-03-09 ENCOUNTER — Other Ambulatory Visit: Payer: Self-pay | Admitting: Adult Health

## 2014-04-10 ENCOUNTER — Other Ambulatory Visit: Payer: Self-pay | Admitting: Internal Medicine

## 2014-05-09 ENCOUNTER — Ambulatory Visit (INDEPENDENT_AMBULATORY_CARE_PROVIDER_SITE_OTHER): Payer: 59 | Admitting: Internal Medicine

## 2014-05-09 ENCOUNTER — Encounter: Payer: Self-pay | Admitting: Internal Medicine

## 2014-05-09 VITALS — BP 128/74 | HR 58 | Temp 98.4°F | Resp 16 | Ht 64.5 in | Wt 234.5 lb

## 2014-05-09 DIAGNOSIS — M130 Polyarthritis, unspecified: Secondary | ICD-10-CM

## 2014-05-09 DIAGNOSIS — E669 Obesity, unspecified: Secondary | ICD-10-CM

## 2014-05-09 DIAGNOSIS — G4733 Obstructive sleep apnea (adult) (pediatric): Secondary | ICD-10-CM

## 2014-05-09 DIAGNOSIS — F329 Major depressive disorder, single episode, unspecified: Secondary | ICD-10-CM

## 2014-05-09 DIAGNOSIS — R0602 Shortness of breath: Secondary | ICD-10-CM

## 2014-05-09 DIAGNOSIS — R5381 Other malaise: Secondary | ICD-10-CM

## 2014-05-09 DIAGNOSIS — R5383 Other fatigue: Secondary | ICD-10-CM

## 2014-05-09 DIAGNOSIS — R748 Abnormal levels of other serum enzymes: Secondary | ICD-10-CM

## 2014-05-09 DIAGNOSIS — Z79899 Other long term (current) drug therapy: Secondary | ICD-10-CM

## 2014-05-09 DIAGNOSIS — I10 Essential (primary) hypertension: Secondary | ICD-10-CM

## 2014-05-09 DIAGNOSIS — E785 Hyperlipidemia, unspecified: Secondary | ICD-10-CM

## 2014-05-09 MED ORDER — BUPROPION HCL ER (XL) 150 MG PO TB24: 150.0000 mg | ORAL_TABLET | Freq: Every day | ORAL | Status: DC

## 2014-05-09 MED ORDER — ALPRAZOLAM 0.25 MG PO TABS: ORAL_TABLET | ORAL | Status: DC

## 2014-05-09 NOTE — Patient Instructions (Signed)
I am starting you on wellbutrin for your depression.  Take it once daily in the morning.  If the wellbutrin is too expensive, refuse it and let me know.  I encourage you to find an exercise partner and go to the gym regularly!  Start a diet,  either my low carb,  Or wEight watchers,  Or Medifast.  Return in a month or so for your annual exam   Bupropion extended-release tablets (Depression/Mood Disorders) What is this medicine? BUPROPION (byoo PROE pee on) is used to treat depression. This medicine may be used for other purposes; ask your health care provider or pharmacist if you have questions. COMMON BRAND NAME(S): Aplenzin, Budeprion XL, Forfivo XL, Wellbutrin XL What should I tell my health care provider before I take this medicine? They need to know if you have any of these conditions: -an eating disorder, such as anorexia or bulimia -bipolar disorder or psychosis -diabetes or high blood sugar, treated with medication -glaucoma -head injury or brain tumor -heart disease, previous heart attack, or irregular heart beat -high blood pressure -kidney or liver disease -seizures (convulsions) -suicidal thoughts or a previous suicide attempt -Tourette's syndrome -weight loss -an unusual or allergic reaction to bupropion, other medicines, foods, dyes, or preservatives -breast-feeding -pregnant or trying to become pregnant How should I use this medicine? Take this medicine by mouth with a glass of water. Follow the directions on the prescription label. You can take it with or without food. If it upsets your stomach, take it with food. Do not crush, chew, or cut these tablets. This medicine is taken once daily at the same time each day. Do not take your medicine more often than directed. Do not stop taking this medicine suddenly except upon the advice of your doctor. Stopping this medicine too quickly may cause serious side effects or your condition may worsen. A special MedGuide will be  given to you by the pharmacist with each prescription and refill. Be sure to read this information carefully each time. Talk to your pediatrician regarding the use of this medicine in children. Special care may be needed. Overdosage: If you think you have taken too much of this medicine contact a poison control center or emergency room at once. NOTE: This medicine is only for you. Do not share this medicine with others. What if I miss a dose? If you miss a dose, skip the missed dose and take your next tablet at the regular time. Do not take double or extra doses. What may interact with this medicine? Do not take this medicine with any of the following medications: -linezolid -MAOIs like Azilect, Carbex, Eldepryl, Marplan, Nardil, and Parnate -methylene blue (injected into a vein) -other medicines that contain bupropion like Zyban This medicine may also interact with the following medications: -alcohol -certain medicines for anxiety or sleep -certain medicines for blood pressure like metoprolol, propranolol -certain medicines for depression or psychotic disturbances -certain medicines for HIV or AIDS like efavirenz, lopinavir, nelfinavir, ritonavir -certain medicines for irregular heart beat like propafenone, flecainide -certain medicines for Parkinson's disease like amantadine, levodopa -certain medicines for seizures like carbamazepine, phenytoin, phenobarbital -cimetidine -clopidogrel -cyclophosphamide -furazolidone -isoniazid -nicotine -orphenadrine -procarbazine -steroid medicines like prednisone or cortisone -stimulant medicines for attention disorders, weight loss, or to stay awake -tamoxifen -theophylline -thiotepa -ticlopidine -tramadol -warfarin This list may not describe all possible interactions. Give your health care provider a list of all the medicines, herbs, non-prescription drugs, or dietary supplements you use. Also tell them if you smoke,  drink alcohol, or use  illegal drugs. Some items may interact with your medicine. What should I watch for while using this medicine? Tell your doctor if your symptoms do not get better or if they get worse. Visit your doctor or health care professional for regular checks on your progress. Because it may take several weeks to see the full effects of this medicine, it is important to continue your treatment as prescribed by your doctor. Patients and their families should watch out for new or worsening thoughts of suicide or depression. Also watch out for sudden changes in feelings such as feeling anxious, agitated, panicky, irritable, hostile, aggressive, impulsive, severely restless, overly excited and hyperactive, or not being able to sleep. If this happens, especially at the beginning of treatment or after a change in dose, call your health care professional. Avoid alcoholic drinks while taking this medicine. Drinking large amounts of alcoholic beverages, using sleeping or anxiety medicines, or quickly stopping the use of these agents while taking this medicine may increase your risk for a seizure. Do not drive or use heavy machinery until you know how this medicine affects you. This medicine can impair your ability to perform these tasks. Do not take this medicine close to bedtime. It may prevent you from sleeping. Your mouth may get dry. Chewing sugarless gum or sucking hard candy, and drinking plenty of water may help. Contact your doctor if the problem does not go away or is severe. The tablet shell for some brands of this medicine does not dissolve. This is normal. The tablet shell may appear whole in the stool. This is not a cause for concern. What side effects may I notice from receiving this medicine? Side effects that you should report to your doctor or health care professional as soon as possible: -allergic reactions like skin rash, itching or hives, swelling of the face, lips, or tongue -breathing  problems -changes in vision -confusion -fast or irregular heartbeat -hallucinations -increased blood pressure -redness, blistering, peeling or loosening of the skin, including inside the mouth -seizures -suicidal thoughts or other mood changes -unusually weak or tired -vomiting Side effects that usually do not require medical attention (report to your doctor or health care professional if they continue or are bothersome): -change in sex drive or performance -constipation -headache -loss of appetite -nausea -tremors -weight loss This list may not describe all possible side effects. Call your doctor for medical advice about side effects. You may report side effects to FDA at 1-800-FDA-1088. Where should I keep my medicine? Keep out of the reach of children. Store at room temperature between 15 and 30 degrees C (59 and 86 degrees F). Throw away any unused medicine after the expiration date. NOTE: This sheet is a summary. It may not cover all possible information. If you have questions about this medicine, talk to your doctor, pharmacist, or health care provider.  2015, [employer]. (2013-02-25 12:39:42)

## 2014-05-09 NOTE — Progress Notes (Signed)
Patient ID: Jessica Clayton, female   DOB: 25-Oct-1960, 53 y.o.   MRN: [id number]  Patient Active Problem List   Diagnosis Date Noted  . Major depressive disorder, single episode, unspecified 05/11/2014  . Other nonspecific abnormal serum enzyme levels 05/11/2014  . Polyarthritis 05/11/2014  . Shortness of breath 05/17/2013  . Family history of colon cancer requiring screening colonoscopy 05/15/2013  . OSA (obstructive sleep apnea) 05/15/2013  . Annual physical exam 05/15/2013  . Paroxysmal atrial fibrillation 03/23/2013  . Laryngospasm 03/01/2012  . Pulmonary nodule, right 03/01/2012  . Hyperlipidemia with target LDL less than 100 02/05/2012  . Chest pain at rest 02/05/2012  . Hypertension 05/15/2011  . Obesity (BMI 30-39.9) 05/15/2011  . Headache, variant migraine 04/18/2011  . Screening for cervical cancer 04/18/2011  . Screening for breast cancer 04/18/2011  . Screening for colon cancer 04/18/2011    Subjective:  CC:   Chief Complaint  Patient presents with  . Follow-up    medication refills    HPI:   Jessica Clayton is a 53 y.o. female who presents for Follow up on chronic conditions including hypertension, hyperlipidemia, worsening obesity , PAF and anxiety.  Last seen SEpt 2014. Her chronic headaches were investigated and she was diagnosed with sleep apnea. She has been using cpap now for several months , waking up feeling rested,  No headaches anymore.  However, she has regained much of the weight she had intentionally lost due to dietary indulgences, including carbonated soft drinks and sweet tea.  3 sodas daily.  Cherry pepsi.    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    She is not exercising.  Having joint pain involving  bilateral hips knees and shoulders.  No red or swollen joints.     Los of familial stressors.  Her sister died unexpectedly several months  ago,  Was found dead in her apt after taking a shower. Autopsy was done but still pending.    Past Medical History  Diagnosis Date  . Arthritis     left shoulder, injected by Margaretmary Eddy 2012  . Menopause, premature     at age 43  . Hypertension   . Hyperlipidemia     hypertriglyceridemia  . Obesity (BMI 30-39.9)   . PAF (paroxysmal atrial fibrillation)     Past Surgical History  Procedure Laterality Date  . Cholecystectomy  1982  . Foot surgery Right     tumor  . Herniated disc    . Tubal ligation         The following portions of the patient's history were reviewed and updated as appropriate: Allergies, current medications, and problem list.    Review of Systems:   Patient denies headache, fevers, malaise, unintentional weight loss, skin rash, eye pain, sinus congestion and sinus pain, sore throat, dysphagia,  hemoptysis , cough, dyspnea, wheezing, chest pain, palpitations, orthopnea, edema, abdominal pain, nausea, melena, diarrhea, constipation, flank pain, dysuria, hematuria, urinary  Frequency, nocturia, numbness, tingling, seizures,  Focal weakness, Loss of consciousness,  Tremor, insomnia, depression, anxiety, and suicidal ideation.     History   Social History  . Marital Status: Divorced    Spouse Name: N/A    Number of Children: N/A  . Years of Education: N/A   Occupational History  . Not on file.   Social History Main Topics  .  Smoking status: Never Smoker   . Smokeless tobacco: Never Used  . Alcohol Use: No  . Drug Use: No  . Sexual Activity: Not on file   Other Topics Concern  . Not on file   Social History Narrative  . No narrative on file    Objective:  Filed Vitals:   05/09/14 1509  BP: 128/74  Pulse: 58  Temp: 98.4 F (36.9 C)  Resp: 16     General appearance: alert, cooperative and appears stated age Ears: normal TM's and external ear canals both ears Throat: lips, mucosa, and tongue normal; teeth and gums normal Neck: no  adenopathy, no carotid bruit, supple, symmetrical, trachea midline and thyroid not enlarged, symmetric, no tenderness/mass/nodules Back: symmetric, no curvature. ROM normal. No CVA tenderness. Lungs: clear to auscultation bilaterally Heart: regular rate and rhythm, S1, S2 normal, no murmur, click, rub or gallop Abdomen: soft, non-tender; bowel sounds normal; no masses,  no organomegaly Pulses: 2+ and symmetric Skin: Skin color, texture, turgor normal. No rashes or lesions Lymph nodes: Cervical, supraclavicular, and axillary nodes normal. MSK:  No synovitis on exam.   Assessment and Plan:  Hypertension Well controlled on current regimen. Renal function stable, no changes today.  Lab Results  Component Value Date   CREATININE 0.8 05/09/2014   Lab Results  Component Value Date   NA 138 05/09/2014   K 3.9 05/09/2014   CL 104 05/09/2014   CO2 26 05/09/2014     OSA (obstructive sleep apnea) Diagnosed by sleep study. She is wearing her CPAP every night a minimum of 6 hours per night and notes improved daytime wakefulness and decreased fatigue   Obesity (BMI 30-39.9) I have addressed  BMI and recommended wt loss of 10% of body weigh over the next 6 months using a low glycemic index diet and regular exercise a minimum of 5 days per week.    Major depressive disorder, single episode, unspecified With anhedonia, lack of motivation a, dweight gain.  Trial of wellbutrin.  Return  In one month   Hyperlipidemia with target LDL less than 100 managd with crestor,  But lfts are mildly elevated today,  Will suspend crestor, encourage wt loss and exercise and return in 6 weeks for fasting labs,  Hepatic profile and additional labs to rule out other causes.   Other nonspecific abnormal serum enzyme levels Fatty liver vs statin vs other,  Suspend statin ,   repeat in 6 weeks with other labs.   Polyarthritis She has not been exercsigin partly due to joint pain,.  ESR and CRP are normal suggestive of  OA as cause,  Encouraged use of aleve and increased physical activity    A total of 40 minutes was spent with patient more than half of which was spent in counseling patient on the above mentioned issues , reviewing and explaining recent labs and imaging studies done, and coordination of care.  Updated Medication List Outpatient Encounter Prescriptions as of 05/09/2014  Medication Sig  . acetaminophen (TYLENOL) 650 MG CR tablet Take 1,300 mg by mouth every 8 (eight) hours as needed for pain.  Marland Kitchen aspirin 81 MG tablet Take 81 mg by mouth daily.  . CRESTOR 10 MG tablet Take 1 tablet (10 mg total) by mouth daily.  Marland Kitchen lisinopril (PRINIVIL,ZESTRIL) 10 MG tablet Take 1 tablet (10 mg total) by mouth daily.  . Multiple Vitamin (MULTIVITAMIN) tablet Take 1 tablet by mouth daily.    . nitroGLYCERIN (NITROSTAT) 0.4 MG SL tablet Place 0.4  mg under the tongue every 5 (five) minutes as needed for chest pain.  Marland Kitchen ALPRAZolam (XANAX) 0.25 MG tablet TAKE ONE TABLET BY MOUTH 2 TIMES A DAY AS NEEDED FOR SLEEP OR ANXIETY  . Azelastine-Fluticasone 137-50 MCG/ACT SUSP Place 1 spray into the nose 2 (two) times daily.  Marland Kitchen buPROPion (WELLBUTRIN XL) 150 MG 24 hr tablet Take 1 tablet (150 mg total) by mouth daily.  . potassium chloride SA (K-DUR,KLOR-CON) 20 MEQ tablet Take 1 tablet (20 mEq total) by mouth daily.  . [DISCONTINUED] acetaminophen (TYLENOL) 325 MG tablet Take 650 mg by mouth every 6 (six) hours as needed.    . [DISCONTINUED] ALPRAZolam (XANAX) 0.25 MG tablet TAKE ONE TABLET BY MOUTH 2 TIMES A DAY AS NEEDED FOR SLEEP OR ANXIETY  . [DISCONTINUED] amoxicillin-clavulanate (AUGMENTIN) 875-125 MG per tablet Take 1 tablet by mouth 2 (two) times daily.     Orders Placed This Encounter  Procedures  . Comp Met (CMET)  . Sedimentation rate  . C-reactive protein  . TSH  . ANA  . Anti-Smith antibody  . Ferritin  . Hepatic function panel  . Hepatitis C antibody  . Hepatitis B surface antigen  . Iron and TIBC  .  Alkaline phosphatase, isoenzymes  . Lipid panel    No Follow-up on file.

## 2014-05-09 NOTE — Progress Notes (Signed)
Pre-visit discussion using our clinic review tool. No additional management support is needed unless otherwise documented below in the visit note.  

## 2014-05-10 LAB — COMPREHENSIVE METABOLIC PANEL
ALT: 43 U/L — AB (ref 0–35)
AST: 39 U/L — ABNORMAL HIGH (ref 0–37)
Albumin: 4.2 g/dL (ref 3.5–5.2)
Alkaline Phosphatase: 83 U/L (ref 39–117)
BUN: 10 mg/dL (ref 6–23)
CALCIUM: 8.8 mg/dL (ref 8.4–10.5)
CO2: 26 meq/L (ref 19–32)
CREATININE: 0.8 mg/dL (ref 0.4–1.2)
Chloride: 104 mEq/L (ref 96–112)
GFR: 76.19 mL/min (ref >60.00)
Glucose, Bld: 99 mg/dL (ref 70–99)
Potassium: 3.9 mEq/L (ref 3.5–5.1)
Sodium: 138 mEq/L (ref 135–145)
Total Bilirubin: 0.7 mg/dL (ref 0.2–1.2)
Total Protein: 7.2 g/dL (ref 6.0–8.3)

## 2014-05-10 LAB — SEDIMENTATION RATE: SED RATE: 17 mm/h (ref 0–22)

## 2014-05-10 LAB — C-REACTIVE PROTEIN: CRP: 0.9 mg/dL (ref 0.5–20.0)

## 2014-05-10 LAB — TSH: TSH: 1.1 u[IU]/mL (ref 0.35–4.50)

## 2014-05-11 DIAGNOSIS — K760 Fatty (change of) liver, not elsewhere classified: Secondary | ICD-10-CM | POA: Insufficient documentation

## 2014-05-11 DIAGNOSIS — F329 Major depressive disorder, single episode, unspecified: Secondary | ICD-10-CM | POA: Insufficient documentation

## 2014-05-11 DIAGNOSIS — F32A Depression, unspecified: Secondary | ICD-10-CM | POA: Insufficient documentation

## 2014-05-11 DIAGNOSIS — M130 Polyarthritis, unspecified: Secondary | ICD-10-CM | POA: Insufficient documentation

## 2014-05-11 NOTE — Assessment & Plan Note (Signed)
With anhedonia, lack of motivation a, dweight gain.  Trial of wellbutrin.  Return  In one month

## 2014-05-11 NOTE — Assessment & Plan Note (Signed)
Diagnosed by sleep study. She is wearing her CPAP every night a minimum of 6 hours per night and notes improved daytime wakefulness and decreased fatigue  

## 2014-05-11 NOTE — Assessment & Plan Note (Signed)
Fatty liver vs statin vs other,  Suspend statin ,   repeat in 6 weeks with other labs.

## 2014-05-11 NOTE — Assessment & Plan Note (Signed)
I have addressed  BMI and recommended wt loss of 10% of body weigh over the next 6 months using a low glycemic index diet and regular exercise a minimum of 5 days per week.   

## 2014-05-11 NOTE — Assessment & Plan Note (Signed)
She has not been exercsigin partly due to joint pain,.  ESR and CRP are normal suggestive of OA as cause,  Encouraged use of aleve and increased physical activity

## 2014-05-11 NOTE — Assessment & Plan Note (Signed)
managd with crestor,  But lfts are mildly elevated today,  Will suspend crestor, encourage wt loss and exercise and return in 6 weeks for fasting labs,  Hepatic profile and additional labs to rule out other causes.

## 2014-05-11 NOTE — Assessment & Plan Note (Signed)
Well controlled on current regimen. Renal function stable, no changes today.  Lab Results  Component Value Date   CREATININE 0.8 05/09/2014   Lab Results  Component Value Date   NA 138 05/09/2014   K 3.9 05/09/2014   CL 104 05/09/2014   CO2 26 05/09/2014

## 2014-05-16 ENCOUNTER — Encounter: Payer: Self-pay | Admitting: *Deleted

## 2014-06-21 ENCOUNTER — Other Ambulatory Visit (INDEPENDENT_AMBULATORY_CARE_PROVIDER_SITE_OTHER): Payer: 59

## 2014-06-21 DIAGNOSIS — R749 Abnormal serum enzyme level, unspecified: Secondary | ICD-10-CM

## 2014-06-21 DIAGNOSIS — E785 Hyperlipidemia, unspecified: Secondary | ICD-10-CM

## 2014-06-21 LAB — HEPATIC FUNCTION PANEL
ALT: 39 U/L — ABNORMAL HIGH (ref 0–35)
AST: 28 U/L (ref 0–37)
Albumin: 3.8 g/dL (ref 3.5–5.2)
Alkaline Phosphatase: 71 U/L (ref 39–117)
Bilirubin, Direct: 0 mg/dL (ref 0.0–0.3)
Total Bilirubin: 0.4 mg/dL (ref 0.2–1.2)
Total Protein: 7.3 g/dL (ref 6.0–8.3)

## 2014-06-21 LAB — LIPID PANEL
CHOL/HDL RATIO: 4
Cholesterol: 166 mg/dL (ref 0–200)
HDL: 44 mg/dL (ref >39.00)
LDL CALC: 105 mg/dL — AB (ref 0–99)
NONHDL: 122
Triglycerides: 87 mg/dL (ref 0.0–149.0)
VLDL: 17.4 mg/dL (ref 0.0–40.0)

## 2014-06-22 LAB — IRON AND TIBC
%SAT: 9 % — ABNORMAL LOW (ref 20–55)
Iron: 35 ug/dL — ABNORMAL LOW (ref 42–145)
TIBC: 398 ug/dL (ref 250–470)
UIBC: 363 ug/dL (ref 125–400)

## 2014-06-22 LAB — HEPATITIS B SURFACE ANTIGEN: HEP B S AG: NEGATIVE

## 2014-06-22 LAB — FERRITIN: Ferritin: 16 ng/mL (ref 10.0–291.0)

## 2014-06-22 LAB — ANA: ANA: NEGATIVE

## 2014-06-22 LAB — ANTI-SMITH ANTIBODY: ENA SM Ab Ser-aCnc: <1

## 2014-06-22 LAB — HEPATITIS C ANTIBODY: HCV AB: NEGATIVE

## 2014-06-23 ENCOUNTER — Encounter: Payer: Self-pay | Admitting: Internal Medicine

## 2014-06-23 ENCOUNTER — Ambulatory Visit (INDEPENDENT_AMBULATORY_CARE_PROVIDER_SITE_OTHER): Payer: 59 | Admitting: Internal Medicine

## 2014-06-23 VITALS — BP 120/74 | HR 60 | Temp 98.4°F | Resp 16 | Ht 64.5 in | Wt 231.5 lb

## 2014-06-23 DIAGNOSIS — Z1239 Encounter for other screening for malignant neoplasm of breast: Secondary | ICD-10-CM

## 2014-06-23 DIAGNOSIS — G4733 Obstructive sleep apnea (adult) (pediatric): Secondary | ICD-10-CM

## 2014-06-23 DIAGNOSIS — R748 Abnormal levels of other serum enzymes: Secondary | ICD-10-CM

## 2014-06-23 DIAGNOSIS — Z0000 Encounter for general adult medical examination without abnormal findings: Secondary | ICD-10-CM

## 2014-06-23 DIAGNOSIS — E669 Obesity, unspecified: Secondary | ICD-10-CM

## 2014-06-23 DIAGNOSIS — E785 Hyperlipidemia, unspecified: Secondary | ICD-10-CM

## 2014-06-23 DIAGNOSIS — R739 Hyperglycemia, unspecified: Secondary | ICD-10-CM

## 2014-06-23 MED ORDER — ALPRAZOLAM 0.25 MG PO TABS: ORAL_TABLET | ORAL | Status: DC

## 2014-06-23 MED ORDER — BUPROPION HCL ER (XL) 150 MG PO TB24: 150.0000 mg | ORAL_TABLET | Freq: Every day | ORAL | Status: DC

## 2014-06-23 NOTE — Patient Instructions (Signed)
Your cholesterol was fine without the Crestor.  Do not resume it for now.  Your liver ezymes are a little lower now and there are no signs of autoimmune diseases  I would like TO GET AN ULTRASOUND to see if the liver looks fatty.  We will repeat the fasting labs in 3 months along with an A1C  Mammogram to be ordered  PAP smear is due next year.  You have lost 3 lbs since last visit. !  I want you to lose 25 more  lbs over the next six months with diet and exercise,  There are many diets that work.    This is  my version of a  "Low GI"  Diet:  It will allow you to lose 4 to 8  lbs  per month if you follow it carefully.  Your goal with exercise is a minimum of 30 minutes of aerobic exercise 5 days per week (Walking does not count once it becomes easy!)     All of the foods can be found at grocery stores and in bulk at [employer].  The Atkins protein bars and shakes are available in more varieties at [employer], [employer] and [employer].     7 AM Breakfast:  Choose from the following:  Low carbohydrate Protein  Shakes (I recommend the EAS AdvantEdge "Carb Control" shakes  Or the low carb shakes by Atkins.    2.5 carbs   Arnold's "Sandwhich Thin"toasted  w/ peanut butter (no jelly: about 20 net carbs  "Bagel Thin" with cream cheese and salmon: about 20 carbs   a scrambled egg/bacon/cheese burrito made with Mission's "carb balance" whole wheat tortilla  (about 10 net carbs )  A slice of home made fritatta (egg based dish without a crust:  google it)    Avoid cereal and bananas, oatmeal and cream of wheat and grits. They are loaded with carbohydrates!   10 AM: high protein snack  Protein bar by Atkins (the snack size, under 200 cal, usually < 6 net carbs).    A stick of cheese:  Around 1 carb,  100 cal     Dannon Light n Fit [place] Yogurt  (80 cal, 8 carbs)  Other so called "protein bars" and Greek yogurts tend to be loaded with carbohydrates.  Remember, in food advertising, the word "energy"  is synonymous for " carbohydrate."  Lunch:   A Sandwich using the bread choices listed, Can use any  Eggs,  lunchmeat, grilled meat or canned tuna), avocado, regular mayo/mustard  and cheese.  A Salad using blue cheese, ranch,  Goddess or vinagrette,  No croutons or "confetti" and no "candied nuts" but regular nuts OK.   No pretzels or chips.  Pickles and miniature sweet peppers are a good low carb alternative that provide a "crunch"  The bread is the only source of carbohydrate in a sandwich and  can be decreased by trying some of these alternatives to traditional loaf bread  Joseph's makes a pita bread and a flat bread that are 50 cal and 4 net carbs available at [place] and [place].  This can be toasted to use with hummous as well  Toufayan makes a low carb flatbread that's 100 cal and 9 net carbs available at [employer] and [employer]'s makes 2 sizes of  Low carb whole wheat tortilla  (The large one is 210 cal and 6 net carbs)  Flat Out makes flatbreads that are low carb as well  Avoid "Low fat dressings, as well as [place] dressings They are loaded with sugar!   3 PM/ Mid day  Snack:  Consider  1 ounce of  almonds, walnuts, pistachios, pecans, peanuts,  Macadamia nuts or a nut medley.  Avoid "granola"; the dried cranberries and raisins are loaded with carbohydrates. Mixed nuts as long as there are no raisins,  cranberries or dried fruit.    Try the prosciutto/mozzarella cheese sticks by Fiorruci  In deli /backery section   High protein   To avoid overindulging in snacks: Try drinking a glass of unsweeted almond/coconut milk  Or a cup of coffee with your Atkins chocolate bar to keep you from having 3!!!   Pork rinds!  Yes Pork Rinds        6 PM  Dinner:     Meat/fowl/fish with a green salad, and either broccoli, cauliflower, green beans, spinach, brussel sprouts or  Lima beans. DO NOT BREAD THE PROTEIN!!      There is a low carb pasta by Dreamfield's that is acceptable  and tastes great: only 5 digestible carbs/serving.( All grocery stores but BJs carry it )  Try Hurley Cisco Angelo's chicken piccata or chicken or eggplant parm over low carb pasta.(Lowes and BJs)   Marjory Lies Sanchez's "Carnitas" (pulled pork, no sauce,  0 carbs) or his beef pot roast to make a dinner burrito (at BJ's)  Pesto over low carb pasta (bj's sells a good quality pesto in the center refrigerated section of the deli   Try satueeing  Cheral Marker with mushroooms  Whole wheat pasta is still full of digestible carbs and  Not as low in glycemic index as Dreamfield's.   Brown rice is still rice,  So skip the rice and noodles if you eat [place] or [place] (or at least limit to 1/2 cup)  9 PM snack :   Breyer's "low carb" fudgsicle or  ice cream bar (Carb Smart line), or  Weight Watcher's ice cream bar , or another "no sugar added" ice cream;  a serving of fresh berries/cherries with whipped cream   Cheese or DANNON'S LlGHT N FIT GREEK YOGURT or the [place] greek yogurt   8 ounces of [place] unsweetened almond/cococunut milk  Cheese and crackers (using WASA crackers,  They are low carb) or peanut butter on low carb crackers or pita bread     Avoid bananas, pineapple, grapes  and watermelon on a regular basis because they are high in sugar.  THINK OF THEM AS DESSERT  Remember that snack Substitutions should be less than 10 NET carbs per serving and meals should be < 25 net carbs. Remember that carbohydrates from fiber do not affect blood sugar, so you can  subtract fiber grams to get the "net carbs " of any particular food item.     Health Maintenance Adopting a healthy lifestyle and getting preventive care can go a long way to promote health and wellness. Talk with your health care provider about what schedule of regular examinations is right for you. This is a good chance for you to check in with your provider about disease prevention and staying healthy. In between checkups, there are plenty of things  you can do on your own. Experts have done a lot of research about which lifestyle changes and preventive measures are most likely to keep you healthy. Ask your health care provider for more information. WEIGHT AND DIET  Eat a healthy diet  Be sure to include plenty  of vegetables, fruits, low-fat dairy products, and lean protein.  Do not eat a lot of foods high in solid fats, added sugars, or salt.  Get regular exercise. This is one of the most important things you can do for your health.  Most adults should exercise for at least 150 minutes each week. The exercise should increase your heart rate and make you sweat (moderate-intensity exercise).  Most adults should also do strengthening exercises at least twice a week. This is in addition to the moderate-intensity exercise.  Maintain a healthy weight  Body mass index (BMI) is a measurement that can be used to identify possible weight problems. It estimates body fat based on height and weight. Your health care provider can help determine your BMI and help you achieve or maintain a healthy weight.  For females 79 years of age and older:   A BMI below 18.5 is considered underweight.  A BMI of 18.5 to 24.9 is normal.  A BMI of 25 to 29.9 is considered overweight.  A BMI of 30 and above is considered obese.  Watch levels of cholesterol and blood lipids  You should start having your blood tested for lipids and cholesterol at 54 years of age, then have this test every 5 years.  You may need to have your cholesterol levels checked more often if:  Your lipid or cholesterol levels are high.  You are older than 54 years of age.  You are at high risk for heart disease.  CANCER SCREENING   Lung Cancer  Lung cancer screening is recommended for adults 76-35 years old who are at high risk for lung cancer because of a history of smoking.  A yearly low-dose CT scan of the lungs is recommended for people who:  Currently smoke.  Have  quit within the past 15 years.  Have at least a 30-pack-year history of smoking. A pack year is smoking an average of one pack of cigarettes a day for 1 year.  Yearly screening should continue until it has been 15 years since you quit.  Yearly screening should stop if you develop a health problem that would prevent you from having lung cancer treatment.  Breast Cancer  Practice breast self-awareness. This means understanding how your breasts normally appear and feel.  It also means doing regular breast self-exams. Let your health care provider know about any changes, no matter how small.  If you are in your 20s or 30s, you should have a clinical breast exam (CBE) by a health care provider every 1-3 years as part of a regular health exam.  If you are 64 or older, have a CBE every year. Also consider having a breast X-ray (mammogram) every year.  If you have a family history of breast cancer, talk to your health care provider about genetic screening.  If you are at high risk for breast cancer, talk to your health care provider about having an MRI and a mammogram every year.  Breast cancer gene (BRCA) assessment is recommended for women who have family members with BRCA-related cancers. BRCA-related cancers include:  Breast.  Ovarian.  Tubal.  Peritoneal cancers.  Results of the assessment will determine the need for genetic counseling and BRCA1 and BRCA2 testing. Cervical Cancer Routine pelvic examinations to screen for cervical cancer are no longer recommended for nonpregnant women who are considered low risk for cancer of the pelvic organs (ovaries, uterus, and vagina) and who do not have symptoms. A pelvic examination may be necessary if  you have symptoms including those associated with pelvic infections. Ask your health care provider if a screening pelvic exam is right for you.   The Pap test is the screening test for cervical cancer for women who are considered at risk.  If  you had a hysterectomy for a problem that was not cancer or a condition that could lead to cancer, then you no longer need Pap tests.  If you are older than 65 years, and you have had normal Pap tests for the past 10 years, you no longer need to have Pap tests.  If you have had past treatment for cervical cancer or a condition that could lead to cancer, you need Pap tests and screening for cancer for at least 20 years after your treatment.  If you no longer get a Pap test, assess your risk factors if they change (such as having a new sexual partner). This can affect whether you should start being screened again.  Some women have medical problems that increase their chance of getting cervical cancer. If this is the case for you, your health care provider may recommend more frequent screening and Pap tests.  The human papillomavirus (HPV) test is another test that may be used for cervical cancer screening. The HPV test looks for the virus that can cause cell changes in the cervix. The cells collected during the Pap test can be tested for HPV.  The HPV test can be used to screen women 32 years of age and older. Getting tested for HPV can extend the interval between normal Pap tests from three to five years.  An HPV test also should be used to screen women of any age who have unclear Pap test results.  After 54 years of age, women should have HPV testing as often as Pap tests.  Colorectal Cancer  This type of cancer can be detected and often prevented.  Routine colorectal cancer screening usually begins at 54 years of age and continues through 54 years of age.  Your health care provider may recommend screening at an earlier age if you have risk factors for colon cancer.  Your health care provider may also recommend using home test kits to check for hidden blood in the stool.  A small camera at the end of a tube can be used to examine your colon directly (sigmoidoscopy or colonoscopy). This is  done to check for the earliest forms of colorectal cancer.  Routine screening usually begins at age 33.  Direct examination of the colon should be repeated every 5-10 years through 54 years of age. However, you may need to be screened more often if early forms of precancerous polyps or small growths are found. Skin Cancer  Check your skin from head to toe regularly.  Tell your health care provider about any new moles or changes in moles, especially if there is a change in a mole's shape or color.  Also tell your health care provider if you have a mole that is larger than the size of a pencil eraser.  Always use sunscreen. Apply sunscreen liberally and repeatedly throughout the day.  Protect yourself by wearing long sleeves, pants, a wide-brimmed hat, and sunglasses whenever you are outside. HEART DISEASE, DIABETES, AND HIGH BLOOD PRESSURE   Have your blood pressure checked at least every 1-2 years. High blood pressure causes heart disease and increases the risk of stroke.  If you are between 33 years and 58 years old, ask your health care provider if  you should take aspirin to prevent strokes.  Have regular diabetes screenings. This involves taking a blood sample to check your fasting blood sugar level.  If you are at a normal weight and have a low risk for diabetes, have this test once every three years after 54 years of age.  If you are overweight and have a high risk for diabetes, consider being tested at a younger age or more often. PREVENTING INFECTION  Hepatitis B  If you have a higher risk for hepatitis B, you should be screened for this virus. You are considered at high risk for hepatitis B if:  You were born in a country where hepatitis B is common. Ask your health care provider which countries are considered high risk.  Your parents were born in a high-risk country, and you have not been immunized against hepatitis B (hepatitis B vaccine).  You have HIV or AIDS.  You  use needles to inject street drugs.  You live with someone who has hepatitis B.  You have had sex with someone who has hepatitis B.  You get hemodialysis treatment.  You take certain medicines for conditions, including cancer, organ transplantation, and autoimmune conditions. Hepatitis C  Blood testing is recommended for:  Everyone born from [birth year] through 1966.  Anyone with known risk factors for hepatitis C. Sexually transmitted infections (STIs)  You should be screened for sexually transmitted infections (STIs) including gonorrhea and chlamydia if:  You are sexually active and are younger than 54 years of age.  You are older than 54 years of age and your health care provider tells you that you are at risk for this type of infection.  Your sexual activity has changed since you were last screened and you are at an increased risk for chlamydia or gonorrhea. Ask your health care provider if you are at risk.  If you do not have HIV, but are at risk, it may be recommended that you take a prescription medicine daily to prevent HIV infection. This is called pre-exposure prophylaxis (PrEP). You are considered at risk if:  You are sexually active and do not regularly use condoms or know the HIV status of your partner(s).  You take drugs by injection.  You are sexually active with a partner who has HIV. Talk with your health care provider about whether you are at high risk of being infected with HIV. If you choose to begin PrEP, you should first be tested for HIV. You should then be tested every 3 months for as long as you are taking PrEP.  PREGNANCY   If you are premenopausal and you may become pregnant, ask your health care provider about preconception counseling.  If you may become pregnant, take 400 to 800 micrograms (mcg) of folic acid every day.  If you want to prevent pregnancy, talk to your health care provider about birth control (contraception). OSTEOPOROSIS AND MENOPAUSE    Osteoporosis is a disease in which the bones lose minerals and strength with aging. This can result in serious bone fractures. Your risk for osteoporosis can be identified using a bone density scan.  If you are 78 years of age or older, or if you are at risk for osteoporosis and fractures, ask your health care provider if you should be screened.  Ask your health care provider whether you should take a calcium or vitamin D supplement to lower your risk for osteoporosis.  Menopause may have certain physical symptoms and risks.  Hormone replacement therapy may reduce  some of these symptoms and risks. Talk to your health care provider about whether hormone replacement therapy is right for you.  HOME CARE INSTRUCTIONS   Schedule regular health, dental, and eye exams.  Stay current with your immunizations.   Do not use any tobacco products including cigarettes, chewing tobacco, or electronic cigarettes.  If you are pregnant, do not drink alcohol.  If you are breastfeeding, limit how much and how often you drink alcohol.  Limit alcohol intake to no more than 1 drink per day for nonpregnant women. One drink equals 12 ounces of beer, 5 ounces of wine, or 1 ounces of hard liquor.  Do not use street drugs.  Do not share needles.  Ask your health care provider for help if you need support or information about quitting drugs.  Tell your health care provider if you often feel depressed.  Tell your health care provider if you have ever been abused or do not feel safe at home. Document Released: 02/17/2011 Document Revised: 12/19/2013 Document Reviewed: 07/06/2013 [facility] Patient Information 2015 [place], [place]. This information is not intended to replace advice given to you by your health care provider. Make sure you discuss any questions you have with your health care provider.

## 2014-06-23 NOTE — Progress Notes (Signed)
Pre-visit discussion using our clinic review tool. No additional management support is needed unless otherwise documented below in the visit note.  

## 2014-06-23 NOTE — Progress Notes (Signed)
Patient ID: Jessica Clayton, female   DOB: 12-16-1960, 53 y.o.   MRN: [id number]  Subjective:     Jessica Clayton is a 53 y.o. female and is here for a comprehensive physical exam. The patient reports problems - with left knee pain and swelling and weight gain. Marland Kitchen  History   Social History  . Marital Status: Divorced    Spouse Name: N/A    Number of Children: N/A  . Years of Education: N/A   Occupational History  . Not on file.   Social History Main Topics  . Smoking status: Never Smoker   . Smokeless tobacco: Never Used  . Alcohol Use: No  . Drug Use: No  . Sexual Activity: Not on file   Other Topics Concern  . Not on file   Social History Narrative   Health Maintenance  Topic Date Due  . PAP SMEAR  09/12/2014  . INFLUENZA VACCINE  03/19/2015  . MAMMOGRAM  07/02/2015  . COLONOSCOPY  11/12/2021  . TETANUS/TDAP  05/14/2023    The following portions of the patient's history were reviewed and updated as appropriate: allergies, current medications, past family history, past medical history, past social history, past surgical history and problem list.  Review of Systems A comprehensive review of systems was negative.   Objective:   BP 120/74 mmHg  Pulse 60  Temp(Src) 98.4 F (36.9 C) (Oral)  Resp 16  Ht 5' 4.5" (1.638 m)  Wt 231 lb 8 oz (105.008 kg)  BMI 39.14 kg/m2  SpO2 98%  General appearance: alert, cooperative and appears stated age Head: Normocephalic, without obvious abnormality, atraumatic Eyes: conjunctivae/corneas clear. PERRL, EOM's intact. Fundi benign. Ears: normal TM's and external ear canals both ears Nose: Nares normal. Septum midline. Mucosa normal. No drainage or sinus tenderness. Throat: lips, mucosa, and tongue normal; teeth and gums normal Neck: no adenopathy, no carotid bruit, no JVD, supple, symmetrical, trachea midline and thyroid not enlarged, symmetric, no tenderness/mass/nodules Lungs: clear to auscultation bilaterally Breasts: pendulous  in  Size and appearance, no masses or tenderness Heart: regular rate and rhythm, S1, S2 normal, no murmur, click, rub or gallop Abdomen: soft, non-tender; bowel sounds normal; no masses,  no organomegaly Extremities: extremities normal, atraumatic, no cyanosis or edema Pulses: 2+ and symmetric Skin: Skin color, texture, turgor normal. No rashes or lesions Neurologic: Alert and oriented X 3, normal strength and tone. Normal symmetric reflexes. Normal coordination and gait.   .    Assessment and Plan:    Visit for preventive health examination Annual preventive health exam was done as well as a comprehensive physical exam and management of acute and chronic conditions .  During the course of the visit the patient was educated and counseled about appropriate screening and preventive services including : fall prevention , diabetes screening, nutrition counseling, colorectal cancer screening, and recommended immunizations.  Printed recommendations for health maintenance screenings was given.   Elevated liver enzymes Improved on repeat but not normal.  Statin was held.  Given her obesity,  Suspect fatty liver.  Autoimmune serologies and ultrasound ordered.   Hyperlipidemia with target LDL less than 100 Repeat lipids without statin are normal.  Will not resume at this time.   Lab Results  Component Value Date   CHOL 166 06/21/2014   HDL 44.00 06/21/2014   LDLCALC 105* 06/21/2014   TRIG 87.0 06/21/2014   CHOLHDL 4 06/21/2014   Lab Results  Component Value Date   ALT 39* 06/21/2014   AST  28 06/21/2014   ALKPHOS 71 06/21/2014   BILITOT 0.4 06/21/2014     Obesity (BMI 30-39.9) With OSA, hypertension and now possible fatty liver.  I have addressed  BMI and recommended wt loss of 10% of body weight over the next 6 months using a low glycemic index diet and regular exercise a minimum of 5 days per week.     Updated Medication List Outpatient Encounter Prescriptions as of 06/23/2014   Medication Sig  . acetaminophen (TYLENOL) 650 MG CR tablet Take 1,300 mg by mouth every 8 (eight) hours as needed for pain.  Marland Kitchen ALPRAZolam (XANAX) 0.25 MG tablet TAKE ONE TABLET BY MOUTH 2 TIMES A DAY AS NEEDED FOR SLEEP OR ANXIETY  . aspirin 81 MG tablet Take 81 mg by mouth daily.  Marland Kitchen buPROPion (WELLBUTRIN XL) 150 MG 24 hr tablet Take 1 tablet (150 mg total) by mouth daily.  Marland Kitchen lisinopril (PRINIVIL,ZESTRIL) 10 MG tablet Take 1 tablet (10 mg total) by mouth daily.  . Multiple Vitamin (MULTIVITAMIN) tablet Take 1 tablet by mouth daily.    . nitroGLYCERIN (NITROSTAT) 0.4 MG SL tablet Place 0.4 mg under the tongue every 5 (five) minutes as needed for chest pain.  . [DISCONTINUED] ALPRAZolam (XANAX) 0.25 MG tablet TAKE ONE TABLET BY MOUTH 2 TIMES A DAY AS NEEDED FOR SLEEP OR ANXIETY  . [DISCONTINUED] buPROPion (WELLBUTRIN XL) 150 MG 24 hr tablet Take 1 tablet (150 mg total) by mouth daily.  . Azelastine-Fluticasone 137-50 MCG/ACT SUSP Place 1 spray into the nose 2 (two) times daily.  . potassium chloride SA (K-DUR,KLOR-CON) 20 MEQ tablet Take 1 tablet (20 mEq total) by mouth daily.  . [DISCONTINUED] CRESTOR 10 MG tablet Take 1 tablet (10 mg total) by mouth daily.

## 2014-06-25 DIAGNOSIS — Z0000 Encounter for general adult medical examination without abnormal findings: Secondary | ICD-10-CM | POA: Insufficient documentation

## 2014-06-25 NOTE — Assessment & Plan Note (Signed)
With OSA, hypertension and now possible fatty liver.  I have addressed  BMI and recommended wt loss of 10% of body weight over the next 6 months using a low glycemic index diet and regular exercise a minimum of 5 days per week.

## 2014-06-25 NOTE — Assessment & Plan Note (Addendum)
Repeat lipids without statin are normal.  Will not resume at this time.   Lab Results  Component Value Date   CHOL 166 06/21/2014   HDL 44.00 06/21/2014   LDLCALC 105* 06/21/2014   TRIG 87.0 06/21/2014   CHOLHDL 4 06/21/2014   Lab Results  Component Value Date   ALT 39* 06/21/2014   AST 28 06/21/2014   ALKPHOS 71 06/21/2014   BILITOT 0.4 06/21/2014

## 2014-06-25 NOTE — Assessment & Plan Note (Signed)
Improved on repeat but not normal.  Statin was held.  Given her obesity,  Suspect fatty liver.  Autoimmune serologies and ultrasound ordered.

## 2014-06-25 NOTE — Assessment & Plan Note (Signed)
Diagnosed by sleep study. She is wearing her CPAP every night a minimum of 6 hours per night and notes improved daytime wakefulness and decreased fatigue  

## 2014-06-25 NOTE — Assessment & Plan Note (Signed)
Annual preventive health exam was done as well as a comprehensive physical exam and management of acute and chronic conditions .  During the course of the visit the patient was educated and counseled about appropriate screening and preventive services including : fall prevention , diabetes screening, nutrition counseling, colorectal cancer screening, and recommended immunizations.  Printed recommendations for health maintenance screenings was given.

## 2014-06-26 LAB — ALKALINE PHOSPHATASE ISOENZYMES
Alkaline Phonsphatase: 82 U/L (ref 33–130)
Bone Isoenzymes: 48 % (ref 28–66)
Intestinal Isoenzymes: 0 % — ABNORMAL LOW (ref 1–24)
Liver Isoenzymes: 52 % (ref 25–69)
Macrohepatic isoenzymes: 0 %

## 2014-06-29 ENCOUNTER — Encounter: Payer: Self-pay | Admitting: *Deleted

## 2014-06-29 ENCOUNTER — Ambulatory Visit: Payer: Self-pay | Admitting: Internal Medicine

## 2014-07-18 ENCOUNTER — Telehealth: Payer: Self-pay | Admitting: Internal Medicine

## 2014-07-18 DIAGNOSIS — R748 Abnormal levels of other serum enzymes: Secondary | ICD-10-CM

## 2014-07-18 NOTE — Telephone Encounter (Signed)
Your recent abdominal ultrasound suggested fatty liver.  Fatty liver can not be cured m  But if not managed,  It can lead to cirrhosis and liver failure and is becoming increasingly moe common because of the prevalence of obesity in adults in the [place].   This is a diagnosis that would require liver biopsy to confirm, or an imaging study called a "Fibroscan."   If you would like to proceed,  I will refer you to Dr Gerald Dexter at [facility] who specializes in fatty liver.  If you would like to defer that procedure for now,  I recommend the same treatment she is going to recommend if the diagnosis were confirmed, which is vaccination against hepatitis A and B,  and weight loss using a low glycemic index diet , trying to reduce your net carbohydrate intake to 40 grams per day, and 30 minutes of exercise 5 days per week   The diet I have you given you in the past is a low glycemic index diet based on the Atkins diet and the Mediterranean lifestyle , and if followed can affect a weight loss of 4 to 8 lbs per month .  Your BMI is currently 37 , so your first goal is to lose 10% of your body weight, or 22 lbs.  I would like to see you back in 3 months and I would like your goal to be a weight loss of 12 lbs by then.  If you haven't reached your goal, we can discuss medications that may help you.

## 2014-07-18 NOTE — Telephone Encounter (Signed)
Left message for pt to return my call.

## 2014-07-18 NOTE — Assessment & Plan Note (Addendum)
Presumed by ultrasound changes and serologies negative for autoimmune causes of hepatitis.  Current liver enzymes are stable and all modifiable risk factors including obesity and hyperlipidemia have been addressed with medications and/or advice to follow a low glycemic diet

## 2014-07-19 NOTE — Telephone Encounter (Signed)
Left message for pt to return my call.

## 2014-07-21 ENCOUNTER — Encounter: Payer: Self-pay | Admitting: *Deleted

## 2014-07-21 NOTE — Telephone Encounter (Signed)
Pt has not returned my calls. Letter mailed.

## 2014-07-27 ENCOUNTER — Encounter: Payer: Self-pay | Admitting: Internal Medicine

## 2014-08-01 ENCOUNTER — Ambulatory Visit: Payer: Self-pay | Admitting: Internal Medicine

## 2014-09-06 ENCOUNTER — Other Ambulatory Visit: Payer: Self-pay | Admitting: Internal Medicine

## 2014-09-06 ENCOUNTER — Telehealth: Payer: Self-pay

## 2014-09-06 ENCOUNTER — Other Ambulatory Visit: Payer: Self-pay | Admitting: Cardiovascular Disease

## 2014-09-06 MED ORDER — LISINOPRIL 10 MG PO TABS: ORAL_TABLET | ORAL | Status: DC

## 2014-09-06 NOTE — Telephone Encounter (Signed)
The pt called and is hoping for a refill on her lisinopril  Thanks!

## 2014-09-06 NOTE — Telephone Encounter (Signed)
Ok to refill,  Refill sent  I saw her in OCTOBER/NOV,  WILL REFILL

## 2014-09-06 NOTE — Telephone Encounter (Signed)
Dr. Gwenyth Ober office refused stating patient needs an appointment? Please advise.

## 2014-09-07 NOTE — Telephone Encounter (Signed)
Patient notified

## 2014-09-12 ENCOUNTER — Encounter: Payer: Self-pay | Admitting: Internal Medicine

## 2014-09-26 ENCOUNTER — Other Ambulatory Visit: Payer: 59

## 2014-11-17 ENCOUNTER — Other Ambulatory Visit: Payer: Self-pay | Admitting: Internal Medicine

## 2014-11-17 NOTE — Telephone Encounter (Signed)
Pt needs appoint for further refills

## 2014-11-21 ENCOUNTER — Other Ambulatory Visit: Payer: Self-pay | Admitting: Internal Medicine

## 2014-12-10 NOTE — H&P (Signed)
PATIENT NAME:  CRYSTALINA, STODGHILL MR#:  [id number] DATE OF BIRTH:  06/05/1961  DATE OF ADMISSION:  02/13/2012  PRIMARY CARE PHYSICIAN: Dr. Deborra Medina    CHIEF COMPLAINT: Shortness of breath and chest pain.   HISTORY OF PRESENT ILLNESS: Ms. 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 She has history of systemic hypertension and obesity. For the last month or so she is having some throat spasm-like feeling described as a numbness feeling. She had been treated for gastroesophageal reflux disease starting with Zantac, changed to Prilosec, and last week she was shifted to [place] without a change in her symptoms. However, last evening the symptoms were different, started with shortness of breath around 7 p.m., continued throughout the night until early morning hours. She describes it as if she was running and getting short of breath. Then she had pressure-like feeling on her chest as if somebody was sitting on her chest. The severity was 7 on a scale of 10. This had eased off gradually by the time she came to the Emergency Department and right now she is symptom free. She reports no nausea, no vomiting. No syncope or near syncope. The patient reports that her sister at age of 44 died suddenly and the autopsy revealed coronary artery disease. However, her sister was a smoker and she had obstructive sleep apnea syndrome.   REVIEW OF SYSTEMS: CONSTITUTIONAL: No fever. No chills. No night sweats. No fatigue. EYES: No blurring of vision. No double vision. ENT: No hearing impairment. No sore throat. No dysphagia. CARDIOVASCULAR: Reports the chest pressure feeling, shortness of breath as above. No edema. No syncope. RESPIRATORY: No cough. No sputum production. No hemoptysis. GASTROINTESTINAL: No abdominal pain. No vomiting. No diarrhea. GENITOURINARY: No dysuria. No frequency of urination. MUSCULOSKELETAL: No joint pain or swelling. No muscular pain  or swelling. INTEGUMENTARY: No skin rash. No ulcers. NEUROLOGY: No focal weakness. No seizure activity. No headache. PSYCHIATRY: No anxiety or depression. ENDOCRINE: No polyuria or polydipsia. No heat or cold intolerance.   PAST MEDICAL HISTORY:  1. History of systemic hypertension.  2. History of migraine. 3. Obesity.   SOCIAL HABITS: Nonsmoker. No history of alcohol or drug abuse.   FAMILY HISTORY: Reports her sister at the age of 58 died suddenly. The autopsy revealed evidence of coronary artery disease. Her sister also suffered from obstructive sleep apnea syndrome.   PAST SURGICAL HISTORY:  1. Tubal ligation. 2. Cholecystectomy. 3. Herniated cervical disk disease at C5, 6, 7 that was operated. 4. Right heel surgery.   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 She is divorced.   ADMISSION MEDICATIONS:  1. Lisinopril with hydrochlorothiazide 20/25 once a day. 2. Dexilant 60 mg once a day. 3. Nitroglycerin p.r.n. used for possible esophageal spasm.   ALLERGIES: No known drug allergies.   PHYSICAL EXAMINATION:   VITAL SIGNS: Blood pressure 95/49, respiratory rate 20, pulse 60, temperature 97.2, oxygen saturation 97%.   GENERAL APPEARANCE: Middle-aged female laying in bed in no acute distress.   HEAD: No pallor. No icterus. No cyanosis.   EARS, NOSE, AND THROAT: Hearing was normal. Nasal mucosa, lips, tongue were normal.   EYES: Normal eyelids and conjunctivae. Pupils about 5 mm, equal and reactive to light.   NECK: Supple. Trachea at midline. No thyromegaly. No cervical lymphadenopathy. No masses.   HEART: Normal S1, S2. No S3, S4.  No murmur. No gallop. No carotid bruits.   RESPIRATORY: Normal breathing pattern without use of accessory muscles. No rales. No wheezing.   ABDOMEN: Soft without tenderness. No hepatosplenomegaly. No masses. No hernias.   SKIN: No ulcers. No subcutaneous nodules.   MUSCULOSKELETAL: No  joint swelling. No clubbing.   NEUROLOGIC: Cranial nerves II through XII were intact. No focal motor deficit.   PSYCHIATRIC: The patient is alert and oriented x3. Mood and affect were normal.   LABORATORY, DIAGNOSTIC, AND RADIOLOGICAL DATA: EKG showed normal sinus rhythm at rate of 73 per minute. Incomplete right bundle branch block. Nonspecific T wave abnormalities in the anterior leads. Otherwise, unremarkable EKG.  Chest x-ray showed heart size normal. No effusions. No consolidation.   Serum glucose 110, BUN 12, creatinine 1, sodium 132, potassium 3.5, chloride 96, serum osmolality calculated was 265 which is normal. Total CPK 94 with normal troponin 0.02. CBC showed white count of 7000, hemoglobin 12, hematocrit 37, platelet count 317.   ASSESSMENT:  1. Recurrent chest pressure-like feeling. 2. Shortness of breath, which had improved.  3. Systemic hypertension.  4. Obesity.  5. Mild hyponatremia likely from the hydrochlorothiazide.  6. Family history of premature coronary artery disease.   PLAN:  1. Will admit the patient to the medical floor for observation.  2. Follow-up on cardiac enzymes.  3. I scheduled the patient to have a nuclear stress test to rule out underlying coronary artery disease. If cardiac work-up is negative, then she will need a GI work-up.  4. I will continue her medications and I will add small dose of beta-blocker.  5. Full dose of Lovenox until clarification whether she has indeed coronary artery disease or not.  6. Aspirin was started as well.   TIME SPENT EVALUATING THIS PATIENT: More than 55 minutes.   ____________________________ Clovis Pu. Lenore Manner, MD amd:drc D: 02/13/2012 06:47:26 ET T: 02/13/2012 08:45:27 ET JOB#: [id number]  cc: Clovis Pu. Lenore Manner, MD, <Dictator> Deborra Medina, MD Mike Craze Irven Coe MD ELECTRONICALLY SIGNED 02/13/2012 22:59

## 2014-12-10 NOTE — Discharge Summary (Signed)
PATIENT NAME:  Jessica Clayton, Jessica Clayton MR#:  [id number] DATE OF BIRTH:  [birth year]  DATE OF ADMISSION:  02/13/2012 DATE OF DISCHARGE:  02/13/2012  DISCHARGE DIAGNOSES:  1. Chest pain, likely musculoskeletal.  2. Gastroesophageal reflux disease.  3. Hypertension.  possible anxiety  DISCHARGE MEDICATIONS:  1. HCTZ/lisinopril 25/20 mg p.o. daily. 2. Dexilant 60 mg extended-release capsule once a day. 3. Nitroglycerin as needed. 0.4 mg p.r.n.  4. Crestor 10 mg daily. 5. Xanax 0.25 mg p.o. b.i.d. p.r.n. for anxiety.    PRIMARY CARE PHYSICIAN: Dr. Derrel Nip.   CONSULTATIONS: None.   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 The patient sees Dr. Derrel Nip. She had been treated for gastroesophageal reflux disease with Zantac and Prilosec. The patient had some trouble breathing and pressure-like pain in the chest. She was admitted to the hospitalist service for chest pain. Please see the history and physical for full details. The patient's vitals are stable on admission except blood pressure slightly low, 95/49. EKG did not show any new changes and troponins have been negative x3. The patient had a stress test done which showed normal. Lexiscan test with no ischemia. Ejection fraction more than 55%. The patient's chest x-ray was normal. White count 7.1, hemoglobin 12.8, hematocrit 37.4, platelets 317. Electrolytes: Sodium 132, potassium 3.5, chloride 96, bicarbonate 28, BUN 12, creatinine 1, glucose 110. The patient's D-dimer was slightly elevated at 0.67 so CT of chest was done. It did not show any pulmonary emboli, but showed indeterminate pulmonary nodules which are 3 mm in size, so the patient was given the report and asked to follow with Dr. Derrel Nip for repeat CT scan probably in one year. The patient's lab data and everything was normal. She actually received a small dose of Xanax in the hospital and wrote prescription for  Xanax 0.25 b.i.d. and asked her to follow with Dr. Derrel Nip in 1 to 2 weeks.  TIME SPENT: More than 30 minutes.     ____________________________ Epifanio Lesches, MD sk:ap D: 02/14/2012 22:19:39 ET T: 02/15/2012 13:21:42 ET JOB#: [id number]  cc: Epifanio Lesches, MD, <Dictator> Deborra Medina, MD Epifanio Lesches MD ELECTRONICALLY SIGNED 02/24/2012 9:34

## 2015-04-27 ENCOUNTER — Other Ambulatory Visit: Payer: Self-pay | Admitting: Internal Medicine

## 2015-04-27 NOTE — Telephone Encounter (Signed)
Pt needs a refill she stated that the her pharmacy has not received the prescription.

## 2015-04-27 NOTE — Telephone Encounter (Signed)
May refill x 1, then must make appointment to see Dr. Derrel Nip

## 2015-05-03 ENCOUNTER — Ambulatory Visit: Payer: 59 | Admitting: Family Medicine

## 2015-05-08 ENCOUNTER — Encounter: Payer: Self-pay | Admitting: Family Medicine

## 2015-05-08 ENCOUNTER — Ambulatory Visit (INDEPENDENT_AMBULATORY_CARE_PROVIDER_SITE_OTHER): Payer: 59 | Admitting: Family Medicine

## 2015-05-08 VITALS — BP 122/68 | HR 54 | Temp 98.2°F | Ht 64.5 in | Wt 233.6 lb

## 2015-05-08 DIAGNOSIS — I10 Essential (primary) hypertension: Secondary | ICD-10-CM

## 2015-05-08 DIAGNOSIS — R079 Chest pain, unspecified: Secondary | ICD-10-CM | POA: Diagnosis not present

## 2015-05-08 DIAGNOSIS — M7989 Other specified soft tissue disorders: Secondary | ICD-10-CM

## 2015-05-08 DIAGNOSIS — F329 Major depressive disorder, single episode, unspecified: Secondary | ICD-10-CM | POA: Diagnosis not present

## 2015-05-08 DIAGNOSIS — F32A Depression, unspecified: Secondary | ICD-10-CM

## 2015-05-08 HISTORY — DX: Chest pain, unspecified: R07.9

## 2015-05-08 LAB — LIPID PANEL
CHOL/HDL RATIO: 4
Cholesterol: 155 mg/dL (ref 0–200)
HDL: 40.5 mg/dL (ref >39.00)
LDL Cholesterol: 90 mg/dL (ref 0–99)
NONHDL: 114.68
Triglycerides: 123 mg/dL (ref 0.0–149.0)
VLDL: 24.6 mg/dL (ref 0.0–40.0)

## 2015-05-08 LAB — CBC
HEMATOCRIT: 40 % (ref 36.0–46.0)
Hemoglobin: 13.2 g/dL (ref 12.0–15.0)
MCHC: 33 g/dL (ref 30.0–36.0)
MCV: 87.6 fl (ref 78.0–100.0)
Platelets: 298 10*3/uL (ref 150.0–400.0)
RBC: 4.57 Mil/uL (ref 3.87–5.11)
RDW: 14.4 % (ref 11.5–15.5)
WBC: 5.4 10*3/uL (ref 4.0–10.5)

## 2015-05-08 LAB — COMPREHENSIVE METABOLIC PANEL
ALT: 47 U/L — AB (ref 0–35)
AST: 35 U/L (ref 0–37)
Albumin: 4.3 g/dL (ref 3.5–5.2)
Alkaline Phosphatase: 73 U/L (ref 39–117)
BUN: 12 mg/dL (ref 6–23)
CO2: 28 meq/L (ref 19–32)
Calcium: 9.4 mg/dL (ref 8.4–10.5)
Chloride: 105 mEq/L (ref 96–112)
Creatinine, Ser: 0.81 mg/dL (ref 0.40–1.20)
GFR: 78.08 mL/min (ref >60.00)
GLUCOSE: 90 mg/dL (ref 70–99)
Potassium: 4.4 mEq/L (ref 3.5–5.1)
SODIUM: 140 meq/L (ref 135–145)
TOTAL PROTEIN: 7.2 g/dL (ref 6.0–8.3)
Total Bilirubin: 0.4 mg/dL (ref 0.2–1.2)

## 2015-05-08 LAB — TSH: TSH: 2.7 u[IU]/mL (ref 0.35–4.50)

## 2015-05-08 MED ORDER — ALPRAZOLAM 0.25 MG PO TABS: ORAL_TABLET | ORAL | Status: DC

## 2015-05-08 MED ORDER — LISINOPRIL 10 MG PO TABS: 10.0000 mg | ORAL_TABLET | Freq: Every day | ORAL | Status: DC

## 2015-05-08 NOTE — Progress Notes (Signed)
Patient ID: Jessica Clayton, female   DOB: December 06, 1960, 54 y.o.   MRN: [id number]  Tommi Rumps, MD Phone: [phone]  Jessica Clayton is a 54 y.o. female who presents today for f/u.  Depression/sleep issues: patient notes she feels well at this point. Denies depression. No anxiety. Notes she is sleeping much better since being on the CPAP. Has not been taking wellbutrin in a number of months. Takes xanax one pil a week. No Si or HI.   HYPERTENSION Disease Monitoring Home BP Monitoring checking, 110's/60's Chest pain- yes, see below    Dyspnea- yes, see below Medications Compliance-  Taking lisinopril. Lightheadedness-  no  Edema- notes minimal bilateral ankle swelling if she wears tennis shoes  Chest pain: patient notes intermittent central chest pressure with physical activity. She notes dyspnea with this as well. She denies diaphoresis. Notes this always improves with rest. No chest pain or dyspnea at rest. No CP or SOB at this time. No history of blood clot. No recent periods of immobility. No unilateral leg swelling. Does note that the she has a history of restless leg syndrome and feels restless at night. No leg pain. No calf swelling or pain.   PMH: nonsmoker.   ROS see HPI  Objective  Physical Exam Filed Vitals:   05/08/15 0932  BP: 122/68  Pulse: 54  Temp: 98.2 F (36.8 C)   Physical Exam  Constitutional: She is well-developed, well-nourished, and in no distress.  HENT:  Head: Normocephalic and atraumatic.  Mouth/Throat: Oropharynx is clear and moist. No oropharyngeal exudate.  Eyes: Conjunctivae are normal. Pupils are equal, round, and reactive to light.  Neck: Neck supple.  Cardiovascular: Normal rate, regular rhythm and normal heart sounds.  Exam reveals no gallop and no friction rub.   No murmur heard. Pulmonary/Chest: Effort normal and breath sounds normal. No respiratory distress. She has no wheezes. She has no rales.  Musculoskeletal:  No calf  swelling, tenderness, or cords, no leg tenderness Bilateral calves 40 cm  Lymphadenopathy:    She has no cervical adenopathy.  Neurological: She is alert. Gait normal.  Skin: Skin is warm and dry. She is not diaphoretic.  Psychiatric: Mood and affect normal.   EKG: sinus bradycardia, rate 53, incomplete RBBB, t wave inversion V3 non-specific, no other ST or T wave changes from previous EKG  Assessment/Plan: Please see individual problem list.  Hypertension At goal. Will continue lisinopril 10 mg daily. Check cmet today. Check lipid panel.   Depression Patient reports this is well controlled without wellbutrin. Has been infrequently taking xanax for sleep. Sleep has improved as well. Patient would like to not restart the wellbutrin. Will refill the xanax for one refill and advised patient to follow-up with her pcp to discuss this medication further and consider discontinuing this medication only after talking to her PCP. Given return precautions.   Chest pain Patient with chest pain on exertion with mild dyspnea. No radiation or diaphoresis. Resolves with rest. Typical sounding chest pain for stable angina. EKG with inverted T wave in V3, though no other ST or T wave changes from last EKG. Has incomplete RBBB that has been apparent on old EKGs. No symptoms at this time. Unlikely VTE given not tachycardic and has normal O2 sat. Unlikely pulmonary process with normal O2 sat and exam. History not consistent with MSK cause. Discussed that this type of discomfort is worrisome for cardiac cause. Will refer to cardiology, previously seen by Dr Rockey Situ. Check BMET, TSH, cbc, and  lipid panel. Given return precautions.     Orders Placed This Encounter  Procedures  . CBC  . TSH  . Comp Met (CMET)  . Lipid Profile  . Ambulatory referral to Cardiology    Referral Priority:  Routine    Referral Type:  Consultation    Referral Reason:  Specialty Services Required    Requested Specialty:  Cardiology      Number of Visits Requested:  1  . EKG 12-Lead    Meds ordered this encounter  Medications  . ALPRAZolam (XANAX) 0.25 MG tablet    Sig: TAKE ONE TABLET BY MOUTH 2 TIMES A DAY AS NEEDED FOR SLEEP OR ANXIETY    Dispense:  60 tablet    Refill:  0  . lisinopril (PRINIVIL,ZESTRIL) 10 MG tablet    Sig: Take 1 tablet (10 mg total) by mouth daily.    Dispense:  30 tablet    Refill:  2    Tommi Rumps

## 2015-05-08 NOTE — Assessment & Plan Note (Signed)
Patient with chest pain on exertion with mild dyspnea. No radiation or diaphoresis. Resolves with rest. Typical sounding chest pain for stable angina. EKG with inverted T wave in V3, though no other ST or T wave changes from last EKG. Has incomplete RBBB that has been apparent on old EKGs. No symptoms at this time. Unlikely VTE given not tachycardic and has normal O2 sat. Unlikely pulmonary process with normal O2 sat and exam. History not consistent with MSK cause. Discussed that this type of discomfort is worrisome for cardiac cause. Will refer to cardiology, previously seen by Dr Rockey Situ. Check BMET, TSH, cbc, and lipid panel. Given return precautions.

## 2015-05-08 NOTE — Progress Notes (Signed)
Pre visit review using our clinic review tool, if applicable. No additional management support is needed unless otherwise documented below in the visit note. 

## 2015-05-08 NOTE — Assessment & Plan Note (Addendum)
Patient reports this is well controlled without wellbutrin. Has been infrequently taking xanax for sleep. Sleep has improved as well. Patient would like to not restart the wellbutrin. Will refill the xanax for one refill and advised patient to follow-up with her pcp to discuss this medication further and consider discontinuing this medication only after talking to her PCP. Given return precautions.

## 2015-05-08 NOTE — Patient Instructions (Signed)
Nice to meet you. We will refer you back to cardiology for evaluation of your chest pain. We will check lab work today to evaluate the intermittent swelling in your ankles. If you develop chest pain, shortness of breath, sweating, swelling in one leg or pain in your calves, fever, cough, or feel poorly please seek medical attention.

## 2015-05-08 NOTE — Assessment & Plan Note (Signed)
At goal. Will continue lisinopril 10 mg daily. Check cmet today. Check lipid panel.

## 2015-05-15 ENCOUNTER — Encounter: Payer: Self-pay | Admitting: Surgical

## 2015-05-29 ENCOUNTER — Ambulatory Visit: Payer: 59 | Admitting: Cardiovascular Disease

## 2015-07-09 ENCOUNTER — Ambulatory Visit (INDEPENDENT_AMBULATORY_CARE_PROVIDER_SITE_OTHER): Payer: 59 | Admitting: Cardiovascular Disease

## 2015-07-09 ENCOUNTER — Encounter: Payer: Self-pay | Admitting: Cardiovascular Disease

## 2015-07-09 VITALS — BP 150/62 | HR 56 | Ht 64.0 in | Wt 235.2 lb

## 2015-07-09 DIAGNOSIS — M7989 Other specified soft tissue disorders: Secondary | ICD-10-CM | POA: Insufficient documentation

## 2015-07-09 DIAGNOSIS — I10 Essential (primary) hypertension: Secondary | ICD-10-CM

## 2015-07-09 DIAGNOSIS — I480 Paroxysmal atrial fibrillation: Secondary | ICD-10-CM | POA: Diagnosis not present

## 2015-07-09 DIAGNOSIS — R079 Chest pain, unspecified: Secondary | ICD-10-CM | POA: Diagnosis not present

## 2015-07-09 DIAGNOSIS — G4733 Obstructive sleep apnea (adult) (pediatric): Secondary | ICD-10-CM

## 2015-07-09 DIAGNOSIS — R0602 Shortness of breath: Secondary | ICD-10-CM | POA: Diagnosis not present

## 2015-07-09 MED ORDER — POTASSIUM CHLORIDE ER 10 MEQ PO TBCR: 10.0000 meq | EXTENDED_RELEASE_TABLET | Freq: Every day | ORAL | Status: DC

## 2015-07-09 MED ORDER — FUROSEMIDE 20 MG PO TABS: 20.0000 mg | ORAL_TABLET | ORAL | Status: DC | PRN

## 2015-07-09 NOTE — Assessment & Plan Note (Signed)
Some chest discomfort in the setting of stairs  I suspect this is from deconditioning  CT coronary calcium score ordered  Low risk for coronary disease given her history

## 2015-07-09 NOTE — Assessment & Plan Note (Signed)
She denies any symptoms concerning for arrhythmia, no changes to her medications  Previously with bradycardia, again seen today. Not on any rate controlling medication

## 2015-07-09 NOTE — Patient Instructions (Addendum)
You are doing well.  Please take lasix with potassium for 3 days in a row for shortness of breath and leg swelling Then take as needed  We will schedule a CT coronary calcium score for chest pain, shortness of breath Monday, November 28 @ 10:00, please arrive @ 9:45 There is a one-time fee of $150 due at the time of your procedure  Please call us if you have new issues that need to be addressed before your next appt.  Your physician wants you to follow-up in: 6 months.  You will receive a reminder letter in the mail two months in advance. If you don't receive a letter, please call our office to schedule the follow-up appointment.     Coronary Calcium Scan A coronary calcium scan is an imaging test used to look for deposits of calcium and other fatty materials (plaques) in the inner lining of the blood vessels of your heart (coronary arteries). These deposits of calcium and plaques can partly clog and narrow the coronary arteries without producing any symptoms or warning signs. This puts you at risk for a heart attack. This test can detect these deposits before symptoms develop.  LET [facility] CARE PROVIDER KNOW ABOUT:  Any allergies you have.  All medicines you are taking, including vitamins, herbs, eye drops, creams, and over-the-counter medicines.  Previous problems you or members of your family have had with the use of anesthetics.  Any blood disorders you have.  Previous surgeries you have had.  Medical conditions you have.  Possibility of pregnancy, if this applies. RISKS AND COMPLICATIONS Generally, this is a safe procedure. However, as with any procedure, complications can occur. This test involves the use of radiation. Radiation exposure can be dangerous to a pregnant woman and her unborn baby. If you are pregnant, you should not have this procedure done.  BEFORE THE PROCEDURE There is no special preparation for the procedure. PROCEDURE  You will need to undress and  put on a hospital gown. You will need to remove any jewelry around your neck or chest.  Sticky electrodes are placed on your chest and are connected to an electrocardiogram (EKG or electrocardiography) machine to recorda tracing of the electrical activity of your heart.  A CT scanner will take pictures of your heart. During this time, you will be asked to lie still and hold your breath for 2-3 seconds while a picture is being taken of your heart. AFTER THE PROCEDURE   You will be allowed to get dressed.  You can return to your normal activities after the scan is done.   This information is not intended to replace advice given to you by your health care provider. Make sure you discuss any questions you have with your health care provider.   Document Released: 01/31/2008 Document Revised: 08/09/2013 Document Reviewed: 04/11/2013 Elsevier Interactive Patient Education [employer].

## 2015-07-09 NOTE — Assessment & Plan Note (Signed)
Initial blood pressure elevated, Jessica Clayton improved on recheck. Mildly anxious  Recommended she monitor her blood pressure at home

## 2015-07-09 NOTE — Assessment & Plan Note (Signed)
We have suggested she take Lasix with potassium as needed for leg swelling  Likely complication from sitting all day, sleep apnea,  Unable to exclude mild acute diastolic CHF

## 2015-07-09 NOTE — Progress Notes (Signed)
Patient ID: Jessica Clayton, female    DOB: [birth year], 54 y.o.   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 with a history of hypertension, hyperlipidemia, obesity, anxiety,  previousepisode of atrial fibrillation in the setting of hypokalemia, who presents for routine followup  Of her atrial fibrillation  She reports having you symptoms of shortness of breath and chest discomfort   Unclear how long the symptoms have been going on, she reports having shortness of breath when climbing up stairs  She is unclear whether to attribute the symptoms to obesity  Also reports having some chest pressure when she is walking quickly especially with stairs  Does report some lower extremity edema, speckling on her legs  She sits for most of the day  No chest pain at rest  No regular exercise program. Weight continues to be a problem  In the past she stopped the Cardizem and anticoagulation on her own  Denies any tachycardia or symptoms concerning for arrhythmia   EKG on today's visit shows normal sinus rhythm with rate 56 bpm, no significant ST or T-wave changes   Other past medical history On March 21 2013, she had significant symptoms of palpitations  while at work. She set up a telemetry monitor for herself which showed atrial fibrillation. Symptoms seem to come and go. She was relatively symptomatically. She went to the emergency room with EKG showing atrial fibrillation with RVR, rate 152 beats per minute. Potassium at that time was 3.3. She was started on Cardizem 120 mg daily.    additional episodes on April 03 2013 lasting for several hours (Sunday while at home) On her last clinic visit, we recommended that she stay on Cardizem 120 mg daily with the xarelto and take diltiazem 30 mg when necessary for atrial fibrillation episodes.   on her last clinic visit, she was not taking the long-acting Cardizem, only diltiazem 30  mg , was not taking anticoagulation   previous stress test in June 2013 showing no ischemia, normal ejection fraction March 2014 ultrasound of the leg done to rule out DVT. No thrombus noted   No Known Allergies  Current Outpatient Prescriptions on File Prior to Visit  Medication Sig Dispense Refill  . aspirin 81 MG tablet Take 81 mg by mouth daily.    Marland Kitchen lisinopril (PRINIVIL,ZESTRIL) 10 MG tablet Take 1 tablet (10 mg total) by mouth daily. 30 tablet 2   No current facility-administered medications on file prior to visit.    Past Medical History  Diagnosis Date  . Arthritis     left shoulder, injected by Margaretmary Eddy 2012  . Menopause, premature     at age 54  . Hypertension   . Hyperlipidemia     hypertriglyceridemia  . Obesity (BMI 30-39.9)   . PAF (paroxysmal atrial fibrillation) [facility])     Past Surgical History  Procedure Laterality Date  . Cholecystectomy  1982  . Foot surgery Right     tumor  . Herniated disc    . Tubal ligation      Social History  reports that she has never smoked. She has never used smokeless tobacco. She reports that she does not drink alcohol or use illicit drugs.  Family History family history includes Alcohol abuse in her father and mother; COPD in her father and sister; Cancer in her father and mother; Early death in her sister; Heart disease in her father  and sister; Hyperlipidemia in her father; Hypertension in her father.    Review of Systems  Constitutional: Positive for unexpected weight change.  Respiratory: Positive for chest tightness and shortness of breath.   Cardiovascular: Positive for leg swelling.  Gastrointestinal: Negative.   Musculoskeletal: Negative.   Skin: Negative.   Neurological: Negative.   Psychiatric/Behavioral: Negative.   All other systems reviewed and are negative.   BP 150/62 mmHg  Pulse 56  Ht 5\' 4"  (1.626 m)  Wt 235 lb 4 oz (106.709 kg)  BMI 40.36 kg/m2  Physical Exam  Constitutional: She is  oriented to person, place, and time. She appears well-developed and well-nourished.  HENT:  Head: Normocephalic.  Nose: Nose normal.  Mouth/Throat: Oropharynx is clear and moist.  Eyes: Conjunctivae are normal. Pupils are equal, round, and reactive to light.  Neck: Normal range of motion. Neck supple. No JVD present.  Cardiovascular: Normal rate, regular rhythm, S1 normal, S2 normal, normal heart sounds and intact distal pulses.  Exam reveals no gallop and no friction rub.   No murmur heard. Pulmonary/Chest: Effort normal and breath sounds normal. No respiratory distress. She has no wheezes. She has no rales. She exhibits no tenderness.  Abdominal: Soft. Bowel sounds are normal. She exhibits no distension. There is no tenderness.  Musculoskeletal: Normal range of motion. She exhibits no edema or tenderness.  Lymphadenopathy:    She has no cervical adenopathy.  Neurological: She is alert and oriented to person, place, and time. Coordination normal.  Skin: Skin is warm and dry. No rash noted. No erythema.  Psychiatric: She has a normal mood and affect. Her behavior is normal. Judgment and thought content normal.    Assessment and Plan  Nursing note and vitals reviewed.

## 2015-07-09 NOTE — Assessment & Plan Note (Signed)
Tolerating her nasal CPAP  Recommended a walking program for weight loss

## 2015-07-09 NOTE — Assessment & Plan Note (Signed)
We have encouraged continued exercise, careful diet management in an effort to lose weight. 

## 2015-07-09 NOTE — Assessment & Plan Note (Signed)
Etiology of her shortness of breath is likely secondary to obesity and deconditioning  We did offer treadmill or stress echo.  The Later  Would be difficult secondary to breast artifact  CT coronary calcium score ordered after long discussion as she is low risk of coronary disease  No smoking history, no diabetes,  cholesterol is excellent

## 2015-07-16 ENCOUNTER — Ambulatory Visit (INDEPENDENT_AMBULATORY_CARE_PROVIDER_SITE_OTHER)
Admission: RE | Admit: 2015-07-16 | Discharge: 2015-07-16 | Disposition: A | Discharge status: Home / Self Care | Payer: Self-pay | Source: Ambulatory Visit | Attending: Cardiovascular Disease | Admitting: Cardiovascular Disease

## 2015-07-16 DIAGNOSIS — R079 Chest pain, unspecified: Secondary | ICD-10-CM

## 2015-07-16 DIAGNOSIS — R0602 Shortness of breath: Secondary | ICD-10-CM

## 2015-07-20 ENCOUNTER — Telehealth: Payer: Self-pay

## 2015-07-20 NOTE — Telephone Encounter (Signed)
Waiting for Dr. Rockey Situ to review and give recommendations.

## 2015-07-20 NOTE — Telephone Encounter (Signed)
Pt would like CT CA Score results. Please call and advise

## 2015-08-07 ENCOUNTER — Encounter: Payer: Self-pay | Admitting: Physician Assistant

## 2015-08-07 ENCOUNTER — Ambulatory Visit: Payer: Self-pay | Admitting: Physician Assistant

## 2015-08-07 VITALS — BP 130/70 | HR 69 | Temp 98.5°F

## 2015-08-07 DIAGNOSIS — R059 Cough, unspecified: Secondary | ICD-10-CM

## 2015-08-07 DIAGNOSIS — R05 Cough: Secondary | ICD-10-CM

## 2015-08-07 DIAGNOSIS — J018 Other acute sinusitis: Secondary | ICD-10-CM

## 2015-08-07 MED ORDER — AZITHROMYCIN 250 MG PO TABS: ORAL_TABLET | ORAL | Status: DC

## 2015-08-07 MED ORDER — FLUTICASONE PROPIONATE 50 MCG/ACT NA SUSP: 2.0000 | Freq: Every day | NASAL | Status: DC

## 2015-08-07 NOTE — Progress Notes (Signed)
S: C/o runny nose and congestion for 3 days, no fever, chills, cp/sob, v/d; mucus is green and thick, cough is sporadic, c/o of facial and dental pain.   Using otc meds: used alka seltzer and delsym  O: PE: perrl eomi, normocephalic, tms dull, nasal mucosa red and swollen, throat injected, neck supple no lymph, lungs c t a, cv rrr, neuro intact  A:  Acute sinusitis   P: zpack, flonase, drink fluids, continue regular meds , use otc meds of choice, return if not improving in 5 days, return earlier if worsening , d/c alka seltzer due to bp

## 2015-08-15 ENCOUNTER — Other Ambulatory Visit: Payer: Self-pay | Admitting: Internal Medicine

## 2015-08-15 DIAGNOSIS — Z1231 Encounter for screening mammogram for malignant neoplasm of breast: Secondary | ICD-10-CM

## 2015-08-16 ENCOUNTER — Other Ambulatory Visit: Payer: Self-pay | Admitting: Internal Medicine

## 2015-08-16 ENCOUNTER — Ambulatory Visit
Admission: RE | Admit: 2015-08-16 | Discharge: 2015-08-16 | Disposition: A | Discharge status: Home / Self Care | Payer: 59 | Source: Ambulatory Visit | Attending: Internal Medicine | Admitting: Internal Medicine

## 2015-08-16 DIAGNOSIS — Z1231 Encounter for screening mammogram for malignant neoplasm of breast: Secondary | ICD-10-CM | POA: Insufficient documentation

## 2015-08-21 ENCOUNTER — Other Ambulatory Visit: Payer: Self-pay | Admitting: Family Medicine

## 2015-11-05 DIAGNOSIS — G4733 Obstructive sleep apnea (adult) (pediatric): Secondary | ICD-10-CM | POA: Diagnosis not present

## 2015-12-06 ENCOUNTER — Other Ambulatory Visit: Payer: Self-pay | Admitting: Family Medicine

## 2016-02-01 ENCOUNTER — Telehealth: Payer: Self-pay | Admitting: Cardiovascular Disease

## 2016-02-01 DIAGNOSIS — H53129 Transient visual loss, unspecified eye: Secondary | ICD-10-CM | POA: Diagnosis not present

## 2016-02-01 NOTE — Telephone Encounter (Signed)
Okay to order carotid ultrasound for recent vision changes Unclear if she is having TIA or strokes  Would also strongly recommend a 30 day monitor and her history of atrial fibrillation, now with vision changes She stop anticoagulation and diltiazem on her own in the past May be having paroxysmal atrial fibrillation

## 2016-02-01 NOTE — Telephone Encounter (Signed)
Pt saw Dr. Ellin Mayhew today for vision changes. Dr. Ellin Mayhew would like like to know if Dr.Gollan recommends pt be sched for carotid u/s.

## 2016-02-01 NOTE — Telephone Encounter (Signed)
Dr. Ellin Mayhew called to speak with nurse re: vision changes for patient.   Patient c/o flashes of light and transient vision loss for the last day or so.  Per Dr. Ellin Mayhew a Carotid Doppler is usually recommended.    Patient is due to see Dr. Rockey Situ. Should i call to schedule fu or wait to see What Rockey Situ says about echo  ?

## 2016-02-04 ENCOUNTER — Telehealth: Payer: Self-pay | Admitting: Cardiovascular Disease

## 2016-02-04 NOTE — Telephone Encounter (Signed)
Attempted to call patient, had opening with Dr Rockey Situ today But number said "temporarily unavailable".

## 2016-02-04 NOTE — Telephone Encounter (Signed)
Attempted to contact pt. Number is "temporarily unavailable".

## 2016-02-04 NOTE — Telephone Encounter (Signed)
Message routed to Dr. Ellin Mayhew @ [phone].

## 2016-02-05 DIAGNOSIS — G4733 Obstructive sleep apnea (adult) (pediatric): Secondary | ICD-10-CM | POA: Diagnosis not present

## 2016-02-05 NOTE — Telephone Encounter (Signed)
Attempted again to contact pt.  Home number rings, then message that "# is temporarily unavailable".

## 2016-02-05 NOTE — Telephone Encounter (Addendum)
Attempted again to contact pt.  Home number rings, then message that "# is temporarily unavailable".  Pt is not at work today.

## 2016-02-07 NOTE — Telephone Encounter (Signed)
  Attempted again to contact pt.  Home number rings, then message that "# is temporarily unavailable".

## 2016-02-22 DIAGNOSIS — H43811 Vitreous degeneration, right eye: Secondary | ICD-10-CM | POA: Diagnosis not present

## 2016-03-25 ENCOUNTER — Other Ambulatory Visit: Payer: Self-pay | Admitting: Internal Medicine

## 2016-05-16 DIAGNOSIS — G4733 Obstructive sleep apnea (adult) (pediatric): Secondary | ICD-10-CM | POA: Diagnosis not present

## 2016-05-28 ENCOUNTER — Telehealth: Payer: Self-pay | Admitting: Cardiovascular Disease

## 2016-05-28 NOTE — Telephone Encounter (Signed)
3 attempts to schedule fu from recall.  Deleting recall.  °

## 2016-06-30 ENCOUNTER — Other Ambulatory Visit: Payer: Self-pay | Admitting: Internal Medicine

## 2016-06-30 NOTE — Telephone Encounter (Signed)
Last OV 05/08/15. Last office visit with Dr. Derrel Nip (PCP) 06/23/2014. Has no appointment scheduled.

## 2016-07-03 ENCOUNTER — Other Ambulatory Visit: Payer: Self-pay | Admitting: Internal Medicine

## 2016-07-03 NOTE — Telephone Encounter (Signed)
Last OV with Dr. Derrel Nip on 06/23/14. Last seen at our office by Dr. Caryl Bis on 05/08/15. Has an appt scheduled to see Dr. Derrel Nip on 07/09/16.

## 2016-07-04 NOTE — Telephone Encounter (Signed)
I will not refill,  As I have not seen her in 2 years, and hse has not had renal function tested in over a year.

## 2016-07-09 ENCOUNTER — Encounter: Payer: Self-pay | Admitting: Internal Medicine

## 2016-07-09 ENCOUNTER — Ambulatory Visit (INDEPENDENT_AMBULATORY_CARE_PROVIDER_SITE_OTHER): Payer: 59 | Admitting: Internal Medicine

## 2016-07-09 VITALS — BP 114/72 | HR 57 | Temp 97.9°F | Resp 12 | Ht 64.0 in | Wt 241.2 lb

## 2016-07-09 DIAGNOSIS — E785 Hyperlipidemia, unspecified: Secondary | ICD-10-CM | POA: Diagnosis not present

## 2016-07-09 DIAGNOSIS — E669 Obesity, unspecified: Secondary | ICD-10-CM | POA: Diagnosis not present

## 2016-07-09 DIAGNOSIS — E559 Vitamin D deficiency, unspecified: Secondary | ICD-10-CM | POA: Diagnosis not present

## 2016-07-09 DIAGNOSIS — I10 Essential (primary) hypertension: Secondary | ICD-10-CM

## 2016-07-09 LAB — COMPREHENSIVE METABOLIC PANEL
ALBUMIN: 4.1 g/dL (ref 3.5–5.2)
ALT: 97 U/L — ABNORMAL HIGH (ref 0–35)
AST: 69 U/L — AB (ref 0–37)
Alkaline Phosphatase: 82 U/L (ref 39–117)
BUN: 10 mg/dL (ref 6–23)
CALCIUM: 8.9 mg/dL (ref 8.4–10.5)
CHLORIDE: 105 meq/L (ref 96–112)
CO2: 29 meq/L (ref 19–32)
CREATININE: 0.81 mg/dL (ref 0.40–1.20)
GFR: 77.74 mL/min (ref >60.00)
Glucose, Bld: 108 mg/dL — ABNORMAL HIGH (ref 70–99)
POTASSIUM: 4 meq/L (ref 3.5–5.1)
SODIUM: 142 meq/L (ref 135–145)
Total Bilirubin: 0.5 mg/dL (ref 0.2–1.2)
Total Protein: 6.5 g/dL (ref 6.0–8.3)

## 2016-07-09 LAB — LIPID PANEL
CHOL/HDL RATIO: 4
CHOLESTEROL: 164 mg/dL (ref 0–200)
HDL: 43.1 mg/dL (ref >39.00)
LDL CALC: 95 mg/dL (ref 0–99)
NonHDL: 120.44
TRIGLYCERIDES: 127 mg/dL (ref 0.0–149.0)
VLDL: 25.4 mg/dL (ref 0.0–40.0)

## 2016-07-09 LAB — TSH: TSH: 3.12 u[IU]/mL (ref 0.35–4.50)

## 2016-07-09 LAB — VITAMIN D 25 HYDROXY (VIT D DEFICIENCY, FRACTURES): VITD: 16.23 ng/mL — ABNORMAL LOW (ref 30.00–100.00)

## 2016-07-09 LAB — HEMOGLOBIN A1C: Hgb A1c MFr Bld: 5.4 % (ref 4.6–6.5)

## 2016-07-09 NOTE — Patient Instructions (Signed)
I'll refill your lisinopril if your BP is > 130/80 once you have your BP rechecked in a few days,

## 2016-07-09 NOTE — Progress Notes (Signed)
Subjective:  Patient ID: Jessica Clayton, female    DOB: November 13, 1960  Age: 55 y.o. MRN: [id number]  CC: The primary encounter diagnosis was Essential hypertension. Diagnoses of Obesity (BMI 30-39.9), Hyperlipidemia with target LDL less than 100, Vitamin D deficiency, and Morbid obesity ([place]) were also pertinent to this visit.  HPI Jessica Clayton presents for follow up on chronic  issues.  Last seen Nov 2015 . 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  Sleeping pretty well.  Sees Gollan for PAF/.  Had cataract surgery bilaterally since last visit. [place] bifocal   Obesity:  Has trouble maintaining weight loss.  .  Not exercising except for  Walking 20 minutes 3 times weekly   Diet reviewed:  Has cut out bread , uses lettuce wraps.  Has cut out pasta,  Drinks 8-9 glasses of water.  Uses vinegar. .  Eats dinner before work,  Snacks on almonds, .  Makes smoothies with vanilla almond milk, berries and protein powder.  Skips meals sometimes   Ran out of lisinopril.      Outpatient Medications Prior to Visit  Medication Sig Dispense Refill  . aspirin 81 MG tablet Take 81 mg by mouth daily.    . fluticasone (FLONASE) 50 MCG/ACT nasal spray Place 2 sprays into both nostrils daily. 16 g 6  . furosemide (LASIX) 20 MG tablet Take 1 tablet (20 mg total) by mouth as needed (for SOB and/or leg swelling). 90 tablet 3  . lisinopril (PRINIVIL,ZESTRIL) 10 MG tablet TAKE 1 TABLET BY MOUTH ONCE DAILY 30 tablet 2  . potassium chloride (K-DUR) 10 MEQ tablet Take 1 tablet (10 mEq total) by mouth daily. 90 tablet 3  . ALPRAZolam (XANAX) 0.25 MG tablet Take 0.25 mg by mouth at bedtime as needed for anxiety.    Marland Kitchen azithromycin (ZITHROMAX Z-PAK) 250 MG tablet 2 pills today then 1 pill a day for 4 days 6 each 0   No facility-administered medications prior to visit.     Review of Systems;  Patient denies headache, fevers, malaise, unintentional weight loss, skin rash,  eye pain, sinus congestion and sinus pain, sore throat, dysphagia,  hemoptysis , cough, dyspnea, wheezing, chest pain, palpitations, orthopnea, edema, abdominal pain, nausea, melena, diarrhea, constipation, flank pain, dysuria, hematuria, urinary  Frequency, nocturia, numbness, tingling, seizures,  Focal weakness, Loss of consciousness,  Tremor, insomnia, depression, anxiety, and suicidal ideation.      Objective:  BP 114/72   Pulse (!) 57   Temp 97.9 F (36.6 C) (Oral)   Resp 12   Ht 5\' 4"  (1.626 m)   Wt 241 lb 4 oz (109.4 kg)   SpO2 97%   BMI 41.41 kg/m   BP Readings from Last 3 Encounters:  07/09/16 114/72  08/07/15 130/70  07/09/15 (!) 150/62    Wt Readings from Last 3 Encounters:  07/09/16 241 lb 4 oz (109.4 kg)  07/09/15 235 lb 4 oz (106.7 kg)  05/08/15 233 lb 9.6 oz (106 kg)    General appearance: alert, cooperative and appears stated age Ears: normal TM's and external ear canals both ears Throat: lips, mucosa, and tongue normal; teeth and gums normal Neck: no adenopathy, no carotid bruit, supple, symmetrical, trachea midline and thyroid not enlarged, symmetric, no tenderness/mass/nodules Back: symmetric, no curvature. ROM normal. No CVA tenderness. Lungs: clear to auscultation bilaterally Heart: regular rate and rhythm, S1, S2 normal, no murmur, click, rub or gallop Abdomen: soft, non-tender;  bowel sounds normal; no masses,  no organomegaly Pulses: 2+ and symmetric Skin: Skin color, texture, turgor normal. No rashes or lesions Lymph nodes: Cervical, supraclavicular, and axillary nodes normal.  Lab Results  Component Value Date   HGBA1C 5.4 07/09/2016    Lab Results  Component Value Date   CREATININE 0.81 07/09/2016   CREATININE 0.81 05/08/2015   CREATININE 0.8 05/09/2014    Lab Results  Component Value Date   WBC 5.4 05/08/2015   HGB 13.2 05/08/2015   HCT 40.0 05/08/2015   PLT 298.0 05/08/2015   GLUCOSE 108 (H) 07/09/2016   CHOL 164 07/09/2016    TRIG 127.0 07/09/2016   HDL 43.10 07/09/2016   LDLCALC 95 07/09/2016   ALT 97 (H) 07/09/2016   AST 69 (H) 07/09/2016   NA 142 07/09/2016   K 4.0 07/09/2016   CL 105 07/09/2016   CREATININE 0.81 07/09/2016   BUN 10 07/09/2016   CO2 29 07/09/2016   TSH 3.12 07/09/2016   HGBA1C 5.4 07/09/2016    Mm Screening Breast Tomo Bilateral  Result Date: 08/17/2015 CLINICAL DATA:  Screening. EXAM: DIGITAL SCREENING BILATERAL MAMMOGRAM WITH 3D TOMO WITH CAD COMPARISON:  Previous exam(s). ACR Breast Density Category a: The breast tissue is almost entirely fatty. FINDINGS: There are no findings suspicious for malignancy. Images were processed with CAD. IMPRESSION: No mammographic evidence of malignancy. A result letter of this screening mammogram will be mailed directly to the patient. RECOMMENDATION: Screening mammogram in one year. (Code:SM-B-01Y) Clayton-RADS CATEGORY  1: Negative. Electronically Signed   By: Jessica Clayton M.D.   On: 08/17/2015 08:16    Assessment & Plan:   Problem List Items Addressed This Visit    Hypertension - Primary    Lisinopril was suspended due to being lost to follow up,  She is normotensive today  Lab Results  Component Value Date   CREATININE 0.81 07/09/2016   Lab Results  Component Value Date   NA 142 07/09/2016   K 4.0 07/09/2016   CL 105 07/09/2016   CO2 29 07/09/2016   No results found for: MICROALBUR, [id number]       Relevant Orders   Comprehensive metabolic panel (Completed)   VITAMIN D 25 Hydroxy (Vit-D Deficiency, Fractures) (Completed)   RESOLVED: Obesity (BMI 30-39.9)   Relevant Orders   Lipid panel (Completed)   TSH (Completed)   Hemoglobin A1c (Completed)   Hyperlipidemia with target LDL less than 100    Improved with dietary changes.  No medication indicated.   Lab Results  Component Value Date   CHOL 164 07/09/2016   HDL 43.10 07/09/2016   LDLCALC 95 07/09/2016   TRIG 127.0 07/09/2016   CHOLHDL 4 07/09/2016         Morbid obesity  ([place])    I have addressed  BMI and recommended wt loss of 10% of body weight over the next 6 months using a low fat, low starch, high protein  fruit/vegetable based Mediterranean diet and 30 minutes of aerobic exercise a minimum of 5 days per week.         Other Visit Diagnoses    Vitamin D deficiency          I have discontinued Ms. Moultry's ALPRAZolam and azithromycin. I am also having her maintain her aspirin, furosemide, potassium chloride, fluticasone, and lisinopril.  No orders of the defined types were placed in this encounter.   Medications Discontinued During This Encounter  Medication Reason  . azithromycin (ZITHROMAX Z-PAK) 250 MG tablet  Completed Course  . ALPRAZolam (XANAX) 0.25 MG tablet Patient Preference    Follow-up: Return in about 3 months (around 10/09/2016).   Crecencio Mc, MD

## 2016-07-09 NOTE — Progress Notes (Signed)
Pre-visit discussion using our clinic review tool. No additional management support is needed unless otherwise documented below in the visit note.  

## 2016-07-12 ENCOUNTER — Encounter: Payer: Self-pay | Admitting: Internal Medicine

## 2016-07-12 DIAGNOSIS — E559 Vitamin D deficiency, unspecified: Secondary | ICD-10-CM | POA: Insufficient documentation

## 2016-07-12 NOTE — Assessment & Plan Note (Addendum)
Lisinopril was suspended due to being lost to follow up,  She is normotensive today  Lab Results  Component Value Date   CREATININE 0.81 07/09/2016   Lab Results  Component Value Date   NA 142 07/09/2016   K 4.0 07/09/2016   CL 105 07/09/2016   CO2 29 07/09/2016   No results found for: Derl Barrow

## 2016-07-12 NOTE — Assessment & Plan Note (Signed)
I have addressed  BMI and recommended wt loss of 10% of body weight over the next 6 months using a low fat, low starch, high protein  fruit/vegetable based Mediterranean diet and 30 minutes of aerobic exercise a minimum of 5 days per week.   

## 2016-07-12 NOTE — Assessment & Plan Note (Signed)
Improved with dietary changes.  No medication indicated.   Lab Results  Component Value Date   CHOL 164 07/09/2016   HDL 43.10 07/09/2016   LDLCALC 95 07/09/2016   TRIG 127.0 07/09/2016   CHOLHDL 4 07/09/2016

## 2016-07-13 ENCOUNTER — Other Ambulatory Visit: Payer: Self-pay | Admitting: Internal Medicine

## 2016-07-13 DIAGNOSIS — K760 Fatty (change of) liver, not elsewhere classified: Secondary | ICD-10-CM

## 2016-07-13 MED ORDER — ERGOCALCIFEROL 1.25 MG (50000 UT) PO CAPS: 50000.0000 [IU] | ORAL_CAPSULE | ORAL | 3 refills | Status: DC

## 2016-07-22 ENCOUNTER — Encounter: Payer: Self-pay | Admitting: *Deleted

## 2016-07-22 NOTE — Progress Notes (Signed)
Letter mailed due to  Patient has not read my chart and has no working number listed.

## 2016-07-25 ENCOUNTER — Other Ambulatory Visit: Payer: Self-pay | Admitting: Internal Medicine

## 2016-07-25 ENCOUNTER — Telehealth: Payer: Self-pay | Admitting: Internal Medicine

## 2016-07-25 DIAGNOSIS — Z1231 Encounter for screening mammogram for malignant neoplasm of breast: Secondary | ICD-10-CM

## 2016-07-25 DIAGNOSIS — K760 Fatty (change of) liver, not elsewhere classified: Secondary | ICD-10-CM

## 2016-07-25 MED ORDER — LISINOPRIL 10 MG PO TABS: 10.0000 mg | ORAL_TABLET | Freq: Every day | ORAL | 0 refills | Status: DC

## 2016-07-25 NOTE — Telephone Encounter (Signed)
Patient want to inform Dr. Derrel Nip on Bp readings and also Patient was informed of results.  Patient understood and no questions, comments, or concerns at this time.

## 2016-07-25 NOTE — Telephone Encounter (Signed)
Pt called and is calling in with some bp readings:  156/70, 120/75, 177/83, 160/63 (this morning). Pt is also requesting lab results. Please advise, thank you!  Call pt @ [phone] (cell) [phone] (work)

## 2016-07-25 NOTE — Telephone Encounter (Signed)
Patient was informed and had no questions other than if you would put in an [place] referral.

## 2016-07-25 NOTE — Telephone Encounter (Signed)
Ask her to resume the lisinopril starting at 10 mg daily.  Recheck blood pressure daily starting in one week and if still >120/70 we will increase to 2 tablet (20 mg daily)  rx sent for 10 mg

## 2016-07-28 ENCOUNTER — Encounter: Payer: Self-pay | Admitting: Physician Assistant

## 2016-07-28 ENCOUNTER — Ambulatory Visit: Payer: Self-pay | Admitting: Physician Assistant

## 2016-07-28 VITALS — BP 130/90 | HR 63 | Temp 98.5°F

## 2016-07-28 DIAGNOSIS — J069 Acute upper respiratory infection, unspecified: Secondary | ICD-10-CM

## 2016-07-28 MED ORDER — FLUTICASONE PROPIONATE 50 MCG/ACT NA SUSP
2.0000 | Freq: Every day | NASAL | 6 refills | Status: DC
Start: 2016-07-28 — End: 2018-10-27

## 2016-07-28 NOTE — Telephone Encounter (Signed)
Ultrasound ordered

## 2016-07-28 NOTE — Progress Notes (Signed)
S: C/o cough,runny nose and congestion for 1 days, no fever, chills, cp/sob, v/d; mucus was white this am but clear throughout the day, cough is sporadic, some chills, had temp last night but not today, was around someone with bronchitis but they wore a mask  Using otc meds: tylenol cold  O: PE: vitals wnl, nad, perrl eomi, normocephalic, tms dull, nasal mucosa red and swollen, throat injected, neck supple no lymph, lungs c t a, cv rrr, neuro intact  A:  Acute viral uri   P: drink fluids, continue regular meds , use otc meds of choice, return if not improving in 5 days, return earlier if worsening , flonase, if worsening will consider calling in an antibiotic

## 2016-08-01 ENCOUNTER — Other Ambulatory Visit: Payer: Self-pay | Admitting: Internal Medicine

## 2016-08-01 DIAGNOSIS — K760 Fatty (change of) liver, not elsewhere classified: Secondary | ICD-10-CM

## 2016-08-07 ENCOUNTER — Ambulatory Visit: Payer: 59

## 2016-08-09 ENCOUNTER — Encounter: Payer: Self-pay | Admitting: Emergency Medicine

## 2016-08-09 ENCOUNTER — Emergency Department: Payer: 59

## 2016-08-09 ENCOUNTER — Emergency Department
Admission: EM | Admit: 2016-08-09 | Discharge: 2016-08-09 | Disposition: A | Discharge status: Home / Self Care | Payer: 59 | Attending: Emergency Medicine | Admitting: Emergency Medicine

## 2016-08-09 DIAGNOSIS — Z79899 Other long term (current) drug therapy: Secondary | ICD-10-CM | POA: Diagnosis not present

## 2016-08-09 DIAGNOSIS — S8992XA Unspecified injury of left lower leg, initial encounter: Secondary | ICD-10-CM | POA: Diagnosis not present

## 2016-08-09 DIAGNOSIS — M25512 Pain in left shoulder: Secondary | ICD-10-CM | POA: Diagnosis not present

## 2016-08-09 DIAGNOSIS — M79644 Pain in right finger(s): Secondary | ICD-10-CM | POA: Diagnosis not present

## 2016-08-09 DIAGNOSIS — S8002XA Contusion of left knee, initial encounter: Secondary | ICD-10-CM | POA: Insufficient documentation

## 2016-08-09 DIAGNOSIS — Z7982 Long term (current) use of aspirin: Secondary | ICD-10-CM | POA: Insufficient documentation

## 2016-08-09 DIAGNOSIS — Y999 Unspecified external cause status: Secondary | ICD-10-CM | POA: Diagnosis not present

## 2016-08-09 DIAGNOSIS — M25562 Pain in left knee: Secondary | ICD-10-CM | POA: Diagnosis not present

## 2016-08-09 DIAGNOSIS — S4992XA Unspecified injury of left shoulder and upper arm, initial encounter: Secondary | ICD-10-CM | POA: Diagnosis not present

## 2016-08-09 DIAGNOSIS — I10 Essential (primary) hypertension: Secondary | ICD-10-CM | POA: Insufficient documentation

## 2016-08-09 DIAGNOSIS — S6991XA Unspecified injury of right wrist, hand and finger(s), initial encounter: Secondary | ICD-10-CM | POA: Diagnosis not present

## 2016-08-09 DIAGNOSIS — Y929 Unspecified place or not applicable: Secondary | ICD-10-CM | POA: Insufficient documentation

## 2016-08-09 DIAGNOSIS — Y939 Activity, unspecified: Secondary | ICD-10-CM | POA: Diagnosis not present

## 2016-08-09 DIAGNOSIS — W010XXA Fall on same level from slipping, tripping and stumbling without subsequent striking against object, initial encounter: Secondary | ICD-10-CM | POA: Diagnosis not present

## 2016-08-09 DIAGNOSIS — W19XXXA Unspecified fall, initial encounter: Secondary | ICD-10-CM

## 2016-08-09 MED ORDER — TRAMADOL HCL 50 MG PO TABS: 50.0000 mg | ORAL_TABLET | Freq: Four times a day (QID) | ORAL | 0 refills | Status: DC | PRN

## 2016-08-09 MED ORDER — MELOXICAM 7.5 MG PO TABS: 7.5000 mg | ORAL_TABLET | Freq: Every day | ORAL | 0 refills | Status: DC

## 2016-08-09 NOTE — ED Notes (Signed)
NAD noted at time of D/C. Pt denies questions or concerns. Pt taken to the lobby via wheelchair at this time.  

## 2016-08-09 NOTE — ED Triage Notes (Signed)
Fell yesterday, L knee and shoulder pain. Mechanical fall.

## 2016-08-09 NOTE — ED Provider Notes (Signed)
[facility] Emergency Department Provider Note   ____________________________________________   First MD Initiated Contact with Patient 08/09/16 1053     (approximate)  I have reviewed the triage vital signs and the nursing notes.   HISTORY  Chief Complaint Knee Pain    HPI Jessica Clayton is a 55 y.o. female patient complaining of pain secondary to a fall yesterday. Patient stated pain is in the left shoulder, left knee, and right thumb. Patient states she's tripped stepping over a curve. Patient denies vertigo or headache prior to fall. Patient rates the pain as 8/10. Patient describes the pain as "achy". Patient stated mild relief taking ibuprofen. Patient does decreased range of motion with adduction and overhead reaching of the left shoulder. Patient state pain with ambulation. Patient also states decreased flexion of the thumb on the right hand after fall.Patient states no need for pain medication at this time.   Past Medical History:  Diagnosis Date  . Arthritis    left shoulder, injected by Margaretmary Eddy 2012  . Hyperlipidemia    hypertriglyceridemia  . Hypertension   . Menopause, premature    at age 44  . Obesity (BMI 30-39.9)   . PAF (paroxysmal atrial fibrillation) [facility])     Patient Active Problem List   Diagnosis Date Noted  . Vitamin D deficiency 07/12/2016  . Morbid obesity ([place]) 07/09/2015  . Leg swelling 07/09/2015  . Chest pain 05/08/2015  . Visit for preventive health examination 06/25/2014  . Depression 05/11/2014  . Hepatic steatosis 05/11/2014  . Polyarthritis 05/11/2014  . Shortness of breath 05/17/2013  . Family history of colon cancer requiring screening colonoscopy 05/15/2013  . OSA (obstructive sleep apnea) 05/15/2013  . Annual physical exam 05/15/2013  . Paroxysmal atrial fibrillation ([place]) 03/23/2013  . Laryngospasm 03/01/2012  . Pulmonary nodule, right 03/01/2012  . Hyperlipidemia with target LDL less than  100 02/05/2012  . Chest pain at rest 02/05/2012  . Hypertension 05/15/2011  . Headache, variant migraine 04/18/2011  . Screening for cervical cancer 04/18/2011  . Screening for breast cancer 04/18/2011  . Screening for colon cancer 04/18/2011    Past Surgical History:  Procedure Laterality Date  . CHOLECYSTECTOMY  1982  . FOOT SURGERY Right    tumor  . herniated disc    . TUBAL LIGATION      Prior to Admission medications   Medication Sig Start Date End Date Taking? Authorizing Provider  aspirin 81 MG tablet Take 81 mg by mouth daily.    Historical Provider, MD  ergocalciferol (DRISDOL) 50000 units capsule Take 1 capsule (50,000 Units total) by mouth once a week. 07/13/16   Crecencio Mc, MD  fluticasone (FLONASE) 50 MCG/ACT nasal spray Place 2 sprays into both nostrils daily. 07/28/16   Versie Starks, PA-C  furosemide (LASIX) 20 MG tablet Take 1 tablet (20 mg total) by mouth as needed (for SOB and/or leg swelling). 07/09/15   Minna Merritts, MD  lisinopril (PRINIVIL,ZESTRIL) 10 MG tablet Take 1 tablet (10 mg total) by mouth daily. 07/25/16   Crecencio Mc, MD  meloxicam (MOBIC) 7.5 MG tablet Take 1 tablet (7.5 mg total) by mouth daily. 08/09/16 08/09/17  Sable Feil, PA-C  potassium chloride (K-DUR) 10 MEQ tablet Take 1 tablet (10 mEq total) by mouth daily. 07/09/15   Minna Merritts, MD  traMADol (ULTRAM) 50 MG tablet Take 1 tablet (50 mg total) by mouth every 6 (six) hours as needed for moderate pain. 08/09/16  Sable Feil, PA-C    Allergies Patient has no known allergies.  Family History  Problem Relation Age of Onset  . Cancer Mother   . Alcohol abuse Mother   . Cancer Father   . COPD Father   . Alcohol abuse Father   . Heart disease Father   . Hypertension Father   . Hyperlipidemia Father   . Early death Sister   . COPD Sister   . Heart disease Sister     Social History Social History  Substance Use Topics  . Smoking status: Never Smoker  .  Smokeless tobacco: Never Used  . Alcohol use No    Review of Systems Constitutional: No fever/chills Eyes: No visual changes. ENT: No sore throat. Cardiovascular: Denies chest pain. Respiratory: Denies shortness of breath. Gastrointestinal: No abdominal pain.  No nausea, no vomiting.  No diarrhea.  No constipation. Genitourinary: Negative for dysuria. Musculoskeletal: Left shoulder, right thumb, and left knee pain. Skin: Negative for rash. Neurological: Negative for headaches, focal weakness or numbness. Endocrine:Hyperlipidemia and hypertension. .  ____________________________________________   PHYSICAL EXAM:  VITAL SIGNS: ED Triage Vitals  Enc Vitals Group     BP 08/09/16 1046 131/78     Pulse Rate 08/09/16 1046 (!) 56     Resp 08/09/16 1046 20     Temp 08/09/16 1046 97.9 F (36.6 C)     Temp Source 08/09/16 1046 Oral     SpO2 08/09/16 1046 96 %     Weight 08/09/16 1047 220 lb (99.8 kg)     Height 08/09/16 1047 5\' 4"  (1.626 m)     Head Circumference --      Peak Flow --      Pain Score 08/09/16 1047 8     Pain Loc --      Pain Edu? --      Excl. in [place]? --     Constitutional: Alert and oriented. Well appearing and in no acute distress.  Eyes: Conjunctivae are normal. PERRL. EOMI. Head: Atraumatic. Nose: No congestion/rhinnorhea. Mouth/Throat: Mucous membranes are moist.  Oropharynx non-erythematous. Neck: No stridor.  No cervical spine tenderness to palpation. Cardiovascular: Normal rate, regular rhythm. Grossly normal heart sounds.  Good peripheral circulation. Respiratory: Normal respiratory effort.  No retractions. Lungs CTAB. Gastrointestinal: Soft and nontender. No distention. No abdominal bruits. No CVA tenderness. Musculoskeletal: No obvious deformity of the left shoulder. Patient demonstrated moderate guarding palpation of the [place] joint. Patient has limited range of motion with abduction overhead reaching secondary to complain of pain. No obvious deformity  to the left knee. There is abrasion anterior superior aspect of the tubular. Mild edema is noted to the lateral aspect of the left knee. Mild crepitus to palpation. Patient has full nuchal range of motion. Examination of the right thumb shows no obvious deformity mild edema. Decreased range of motion with flexion. Patient is right-hand dominant. Neurologic:  Normal speech and language. No gross focal neurologic deficits are appreciated. No gait instability. Skin:  Skin is warm, dry and intact. No rash noted. Psychiatric: Mood and affect are normal. Speech and behavior are normal.  ____________________________________________   LABS (all labs ordered are listed, but only abnormal results are displayed)  Labs Reviewed - No data to display ____________________________________________  EKG   ____________________________________________  RADIOLOGY  X-ray of the left shoulder, right thumb, and left knee shows no fractures. Degenerative changes were found in all 3 exams. ____________________________________________   PROCEDURES  Procedure(s) performed: None  Procedures  Critical Care  performed: No  ____________________________________________   INITIAL IMPRESSION / ASSESSMENT AND PLAN / ED COURSE  Pertinent labs & imaging results that were available during my care of the patient were reviewed by me and considered in my medical decision making (see chart for details).  Left shoulder, right thumb, and left knee pain secondary to fall. Patient given discharge care instructions. Patient given a prescription for tramadol to take as needed for pain. Patient advised follow-up with family doctor if condition persists.  Clinical Course      ____________________________________________   FINAL CLINICAL IMPRESSION(S) / ED DIAGNOSES  Final diagnoses:  Fall, initial encounter  Contusion of left knee, initial encounter  Left anterior shoulder pain      NEW MEDICATIONS STARTED  DURING THIS VISIT:  New Prescriptions   MELOXICAM (MOBIC) 7.5 MG TABLET    Take 1 tablet (7.5 mg total) by mouth daily.   TRAMADOL (ULTRAM) 50 MG TABLET    Take 1 tablet (50 mg total) by mouth every 6 (six) hours as needed for moderate pain.     Note:  This document was prepared using Dragon voice recognition software and may include unintentional dictation errors.    Sable Feil, PA-C 08/09/16 1208    Lisa Roca, MD 08/09/16 [phone]

## 2016-08-15 DIAGNOSIS — G4733 Obstructive sleep apnea (adult) (pediatric): Secondary | ICD-10-CM | POA: Diagnosis not present

## 2016-08-27 ENCOUNTER — Ambulatory Visit
Admission: RE | Admit: 2016-08-27 | Discharge: 2016-08-27 | Disposition: A | Discharge status: Home / Self Care | Payer: 59 | Source: Ambulatory Visit | Attending: Internal Medicine | Admitting: Internal Medicine

## 2016-08-27 DIAGNOSIS — Z1231 Encounter for screening mammogram for malignant neoplasm of breast: Secondary | ICD-10-CM | POA: Diagnosis not present

## 2016-10-03 ENCOUNTER — Telehealth: Payer: Self-pay | Admitting: Internal Medicine

## 2016-10-03 NOTE — Telephone Encounter (Signed)
Pt called and stated that she would like to that ultrasound done on her liver again, as suggested by Dr. Derrel Nip in November.. Please advise, thank you!  Call pt @ [phone]

## 2016-10-06 NOTE — Telephone Encounter (Signed)
LOV w/ labs: 07/09/16 No existing appt Please advise

## 2016-10-07 NOTE — Telephone Encounter (Signed)
It was ordered on dec 15th.  Routing to [employer]

## 2016-10-09 DIAGNOSIS — M7542 Impingement syndrome of left shoulder: Secondary | ICD-10-CM | POA: Diagnosis not present

## 2016-10-09 DIAGNOSIS — M1712 Unilateral primary osteoarthritis, left knee: Secondary | ICD-10-CM | POA: Diagnosis not present

## 2016-10-10 ENCOUNTER — Ambulatory Visit
Admission: RE | Admit: 2016-10-10 | Discharge: 2016-10-10 | Disposition: A | Discharge status: Home / Self Care | Payer: 59 | Source: Ambulatory Visit | Attending: Internal Medicine | Admitting: Internal Medicine

## 2016-10-10 DIAGNOSIS — R93422 Abnormal radiologic findings on diagnostic imaging of left kidney: Secondary | ICD-10-CM | POA: Diagnosis not present

## 2016-10-10 DIAGNOSIS — K760 Fatty (change of) liver, not elsewhere classified: Secondary | ICD-10-CM | POA: Diagnosis not present

## 2016-10-12 ENCOUNTER — Other Ambulatory Visit: Payer: Self-pay | Admitting: Internal Medicine

## 2016-10-12 ENCOUNTER — Encounter: Payer: Self-pay | Admitting: Internal Medicine

## 2016-10-12 DIAGNOSIS — N2889 Other specified disorders of kidney and ureter: Secondary | ICD-10-CM

## 2016-10-15 NOTE — Telephone Encounter (Signed)
I have scheduled Jessica Clayton for a MRI on 3/12. She did not know anything about needing the MRI. She would like a call regarding the ultrasound that she had since she states that she hasn't heard anything. It looks like Dr. Derrel Nip sent her a message through [place], but she has not been in her mychart in a long time. Pt cb [phone]

## 2016-10-17 ENCOUNTER — Telehealth: Payer: Self-pay | Admitting: *Deleted

## 2016-10-17 NOTE — Telephone Encounter (Signed)
Pt informed of below.  

## 2016-10-17 NOTE — Telephone Encounter (Signed)
Pt called back returning your call. Thank you! °

## 2016-10-17 NOTE — Telephone Encounter (Signed)
Left mess for patient to call back re: below   abdominal ultrasound 10/12/2016 7:38 PM    To: Jessica Clayton    From: Crecencio Mc, MD    Created: 10/12/2016 7:38 PM     Ms Jessica Clayton,  Your recent abdominal ultrasound suggests that you may havefatty liver, although this cannot be completely diagnosed by ultrasound .  The radiologist also noticed a possible mass on your left kidney measuring 2 cm.It was difficult to characterize and may be completely normal,But he is recommending an MRI without contrast for clarification.I have ordered this study and the office will call you when it is set up.If it is normal,No further workup will be needed,If it is suspicious looking, I will make a referral to a urologist.   Please make a follow up appointment to discuss how to manage fatty liver int he nextMonth or so.  Regards,   Deborra Medina, MD

## 2016-10-22 ENCOUNTER — Ambulatory Visit: Payer: 59 | Attending: Internal Medicine

## 2016-10-22 DIAGNOSIS — M25512 Pain in left shoulder: Secondary | ICD-10-CM | POA: Diagnosis not present

## 2016-10-22 DIAGNOSIS — M6281 Muscle weakness (generalized): Secondary | ICD-10-CM | POA: Insufficient documentation

## 2016-10-22 DIAGNOSIS — M25612 Stiffness of left shoulder, not elsewhere classified: Secondary | ICD-10-CM | POA: Diagnosis not present

## 2016-10-22 DIAGNOSIS — G8929 Other chronic pain: Secondary | ICD-10-CM | POA: Insufficient documentation

## 2016-10-22 NOTE — Therapy (Signed)
[place] MAIN [facility] SERVICES [address] [place], [place], [address] Phone: [phone]   Fax:  [phone]  Physical Therapy Treatment  Patient Details  Name: Jessica Clayton MRN: [id number] Date of Birth: [birth year] Referring Provider: Dr. Clover Mealy Date: 10/22/2016      PT End of Session - 10/22/16 0923    Visit Number 1   Number of Visits 12   Date for PT Re-Evaluation 12/03/16   PT Start Time 0800   PT Stop Time 0900   PT Time Calculation (min) 60 min   Activity Tolerance Patient tolerated treatment well   Behavior During Therapy [facility] for tasks assessed/performed      Past Medical History:  Diagnosis Date  . Arthritis    left shoulder, injected by Margaretmary Eddy 2012  . Hyperlipidemia    hypertriglyceridemia  . Hypertension   . Menopause, premature    at age 79  . Obesity (BMI 30-39.9)   . PAF (paroxysmal atrial fibrillation) ([place])     Past Surgical History:  Procedure Laterality Date  . CHOLECYSTECTOMY  1982  . FOOT SURGERY Right    tumor  . herniated disc    . TUBAL LIGATION      There were no vitals filed for this visit.      Subjective Assessment - 10/22/16 0813    Subjective Patient presents with acute on chronic with increased L shoulder pain with is exaccerbated after a fall on her L side. Patient denies radiating pain. Patient reports her left shoulder is 60% the strength in her left compared to her right. Patient demonstrates increased pain with overhead motions, and putting her hand behind her back and head. The worst pain in past week has been a 5/10, the best it's felt in the past week is a 0/10, but has the increased pain with arm movements.    Limitations Lifting   Patient Stated Goals Improve range of motion without pain   Currently in Pain? Yes   Pain Score 4    Pain Location Shoulder   Pain Orientation Left   Pain Descriptors / Indicators Aching   Pain Type Acute pain;Chronic pain   Pain Onset More than a month ago   Aggravating Factors  Lifting, raising arm overhead    Pain Relieving Factors ice, heat            [facility] PT Assessment - 10/22/16 0809      Assessment   Medical Diagnosis L shoulder impingement   Referring Provider Dr. Linton Rump   Onset Date/Surgical Date 08/09/16   Hand Dominance Right   Next MD Visit 11/17/16   Prior Therapy none     Balance Screen   Has the patient fallen in the past 6 months Yes   How many times? 1   Has the patient had a decrease in activity level because of a fear of falling?  No   Is the patient reluctant to leave their home because of a fear of falling?  No     Home Social worker Private residence   Living Arrangements Children   Available Help at Discharge Family   Type of [place] to enter   Entrance Stairs-Number of Steps 4   Entrance Stairs-Rails Right   [place] One level   [place] None     Prior Function   Level of Independence Independent   Vocation Full time employment  Vocation Requirements Reaching to the printer, sitting, and computer work   Leisure playing with grandkids     Cognition   Overall Cognitive Status Within Functional Limits for tasks assessed     ROM / Strength   AROM / PROM / Strength AROM;PROM;Strength     AROM   AROM Assessment Site Shoulder   Right/Left Shoulder Right;Left   Right Shoulder Flexion 180 Degrees   Right Shoulder ABduction 180 Degrees   Right Shoulder Internal Rotation 75 Degrees   Right Shoulder External Rotation 75 Degrees   Left Shoulder Flexion 120 Degrees   Left Shoulder ABduction 90 Degrees   Left Shoulder Internal Rotation 60 Degrees   Left Shoulder External Rotation 50 Degrees   Cervical Flexion 35   Cervical Extension 25   Cervical - Right Side Bend 25   Cervical - Left Side Bend 25  Increased pain    Cervical - Right Rotation 60   Cervical - Left Rotation 62   Thoracic Flexion WNL   Thoracic  Extension Limited 75%   Thoracic - Right Side Bend Limited 50%   Thoracic - Left Side Bend Limited 50%   Thoracic - Right Rotation Limited 25%   Thoracic - Left Rotation Limited 25%     PROM   PROM Assessment Site Shoulder   Right/Left Shoulder Left   Left Shoulder Flexion 120 Degrees   Left Shoulder ABduction 100 Degrees   Left Shoulder Internal Rotation 65 Degrees   Left Shoulder External Rotation 65 Degrees     Strength   Overall Strength Comments R shoulder: WNL   Strength Assessment Site Shoulder   Right/Left Shoulder Left   Left Shoulder Flexion 3/5   Left Shoulder Extension 4/5   Left Shoulder ABduction 3/5   Left Shoulder Internal Rotation 4-/5   Left Shoulder External Rotation 4-/5     Palpation   Palpation comment Increased muscular spasms along distal upper trap and supraspinatus.      Special Tests    Special Tests Rotator Cuff Impingement   Rotator Cuff Impingment tests Empty Can test;Painful Arc of Motion;Neer impingement test;Hawkins- Kennedy test     Neer Impingement test    Findings Positive   Side Left     Hawkins-Kennedy test   Findings Negative   Side Left     Empty Can test   Findings Positive   Side Left     Painful Arc of Motion   Findings Positive   Side Left   Comments Painful arc beginning at 120 deg      Observation Apply scratch test: behind head: C7; Behind back: to L hip  Therapeutic Exercise: Scapular retraction in sitting -- x 10 Shoulder flexion overhead -- x 5 B Shoulder external rotation in sitting -- x 10            PT Education - 10/22/16 0922    Education provided Yes   Education Details HEP: scapular retractions, B shoulder ER, Shoulder flexion    Person(s) Educated Patient   Methods Explanation;Demonstration;Handout   Comprehension Verbalized understanding;Returned demonstration             PT Long Term Goals - 10/22/16 0930      PT LONG TERM GOAL #1   Title Patient will be able to raise L arm  overhead to 180 degrees to demonstrate functional improvements for reaching dinnerware in high cabinets   Baseline L shoulder AROM: 120   Time 6   Period Weeks   Status New  PT LONG TERM GOAL #2   Title Patient will be able to reach T8 level when reaching behind her back in order to donn/doff her bra.   Baseline Painful when reaching to L glute/hip on the L shoulder    Time 6   Period Weeks   Status New     PT LONG TERM GOAL #3   Title Patient will be able to reach to T4 level behind her head with her L shoulder in order to more easily scratch her back/neck.    Baseline Painful at C7 level   Time 6   Period Weeks   Status New               Plan - 10/22/16 8250    Clinical Impression Statement Patient demonstrates increased shoulder dysfunction as indicated by increased pain with overhead motions, positive neers test, postive empty can test, and painful arc with overhead motion. Patient demonstrates increased shoulder and UE weakness along his L UE contributing to increased pain and decreased motor control. Patient will benefit from further skilled therapy focused on improving shoulder mobility and strength to allow for the performance of functional UE movements.   Rehab Potential Fair   PT Frequency 2x / week   PT Duration 6 weeks   PT Treatment/Interventions Cryotherapy;Electrical Stimulation;Moist Heat;Ultrasound;Iontophoresis 4mg /ml Dexamethasone;Therapeutic exercise;Patient/family education;Neuromuscular re-education;Passive range of motion;Manual techniques   PT Next Visit Plan Improve L shoulder AROM/strength   PT Home Exercise Plan HEP: scapular retraction, shoulder flexion, B shoulder ER   Consulted and Agree with Plan of Care Patient      Patient will benefit from skilled therapeutic intervention in order to improve the following deficits and impairments:  Pain, Increased muscle spasms, Decreased coordination, Decreased mobility, Increased fascial restricitons,  Improper body mechanics, Decreased activity tolerance, Decreased range of motion, Decreased endurance, Decreased strength, Hypomobility, Impaired UE functional use, Decreased balance, Impaired flexibility  Visit Diagnosis: Chronic left shoulder pain  Muscle weakness (generalized)  Stiffness of left shoulder, not elsewhere classified     Problem List Patient Active Problem List   Diagnosis Date Noted  . Vitamin D deficiency 07/12/2016  . Morbid obesity ([place]) 07/09/2015  . Leg swelling 07/09/2015  . Chest pain 05/08/2015  . Visit for preventive health examination 06/25/2014  . Depression 05/11/2014  . Hepatic steatosis 05/11/2014  . Polyarthritis 05/11/2014  . Shortness of breath 05/17/2013  . Family history of colon cancer requiring screening colonoscopy 05/15/2013  . OSA (obstructive sleep apnea) 05/15/2013  . Annual physical exam 05/15/2013  . Paroxysmal atrial fibrillation ([place]) 03/23/2013  . Laryngospasm 03/01/2012  . Pulmonary nodule, right 03/01/2012  . Hyperlipidemia with target LDL less than 100 02/05/2012  . Chest pain at rest 02/05/2012  . Hypertension 05/15/2011  . Headache, variant migraine 04/18/2011  . Screening for cervical cancer 04/18/2011  . Screening for breast cancer 04/18/2011  . Screening for colon cancer 04/18/2011    Blythe Stanford, PT DPT 10/22/2016, 9:57 AM  [place] MAIN [facility] SERVICES [address] [place], [place], [address] Phone: [phone]   Fax:  [phone]  Name: Jessica Clayton MRN: [id number] Date of Birth: [birth year]

## 2016-10-24 ENCOUNTER — Ambulatory Visit: Payer: 59

## 2016-10-25 ENCOUNTER — Other Ambulatory Visit: Payer: Self-pay | Admitting: Internal Medicine

## 2016-10-27 ENCOUNTER — Ambulatory Visit
Admission: RE | Admit: 2016-10-27 | Discharge: 2016-10-27 | Disposition: A | Discharge status: Home / Self Care | Payer: 59 | Source: Ambulatory Visit | Attending: Internal Medicine | Admitting: Internal Medicine

## 2016-10-27 ENCOUNTER — Ambulatory Visit: Payer: 59

## 2016-10-27 DIAGNOSIS — M25512 Pain in left shoulder: Secondary | ICD-10-CM

## 2016-10-27 DIAGNOSIS — M6281 Muscle weakness (generalized): Secondary | ICD-10-CM | POA: Diagnosis not present

## 2016-10-27 DIAGNOSIS — G8929 Other chronic pain: Secondary | ICD-10-CM | POA: Diagnosis not present

## 2016-10-27 DIAGNOSIS — M25612 Stiffness of left shoulder, not elsewhere classified: Secondary | ICD-10-CM | POA: Diagnosis not present

## 2016-10-27 DIAGNOSIS — K760 Fatty (change of) liver, not elsewhere classified: Secondary | ICD-10-CM | POA: Insufficient documentation

## 2016-10-27 DIAGNOSIS — N2889 Other specified disorders of kidney and ureter: Secondary | ICD-10-CM | POA: Insufficient documentation

## 2016-10-27 MED ORDER — LISINOPRIL 10 MG PO TABS: 10.0000 mg | ORAL_TABLET | Freq: Every day | ORAL | 0 refills | Status: DC

## 2016-10-27 NOTE — Telephone Encounter (Signed)
Medication has been refilled.

## 2016-10-27 NOTE — Therapy (Signed)
[place] MAIN [facility] SERVICES [address] [place], [place], [address] Phone: [phone]   Fax:  [phone]  Physical Therapy Treatment  Patient Details  Name: Jessica Clayton MRN: [id number] Date of Birth: 1961-06-25 Referring Provider: Dr. Carlynn Spry  Encounter Date: 10/27/2016      PT End of Session - 10/27/16 0956    Visit Number 2   Number of Visits 12   Date for PT Re-Evaluation 12/03/16   PT Start Time 0845   PT Stop Time 0930   PT Time Calculation (min) 45 min   Activity Tolerance Patient tolerated treatment well   Behavior During Therapy [facility] for tasks assessed/performed      Past Medical History:  Diagnosis Date  . Arthritis    left shoulder, injected by Margaretmary Eddy 2012  . Hyperlipidemia    hypertriglyceridemia  . Hypertension   . Menopause, premature    at age 53  . Obesity (BMI 30-39.9)   . PAF (paroxysmal atrial fibrillation) ([place])     Past Surgical History:  Procedure Laterality Date  . CHOLECYSTECTOMY  1982  . FOOT SURGERY Right    tumor  . herniated disc    . TUBAL LIGATION      There were no vitals filed for this visit.      Subjective Assessment - 10/27/16 0949    Subjective Patient reports increased pain today after sitting all night on her shift; patient reports increased pain along the top of her shoulder.    Limitations Lifting   Patient Stated Goals Improve range of motion without pain   Currently in Pain? Yes   Pain Score 5    Pain Location Shoulder   Pain Orientation Left   Pain Descriptors / Indicators Aching   Pain Onset More than a month ago      TREATMENT: Manual Therapy: STM to patient upper traps and cervical extensors with patient positioned in sitting to decrease pain and spasms. Shoulder mobs grade III to A->P, Superior -> inferior; 3 x 30sec Therapeutic Exercise: Scapular retraction in sitting -- x 10 Scapular depression in sidelying - x 10 with 2 sec hold  requiring frequent tactile cueing to perform Shoulder flexion overhead in supine-x30 performed to top of the head  Scapular retraction at MATRIX with B UE support - 7.5# x15 Scapular Hip row in sitting on chair at MATRIX- 15# x15 with tactile cueing required throughout movement  Straight arm push downs at MATRIX -15# in standing x15  *Patient's pain decreased from a 5/10 to a 2/10 after exercise       PT Education - 10/27/16 0952    Education provided Yes   Education Details Form/technique with exercises   Person(s) Educated Patient   Methods Explanation;Demonstration   Comprehension Verbalized understanding;Returned demonstration             PT Long Term Goals - 10/22/16 0930      PT LONG TERM GOAL #1   Title Patient will be able to raise L arm overhead to 180 degrees to demonstrate functional improvements for reaching dinnerware in high cabinets   Baseline L shoulder AROM: 120   Time 6   Period Weeks   Status New     PT LONG TERM GOAL #2   Title Patient will be able to reach T8 level when reaching behind her back in order to donn/doff her bra.   Baseline Painful when reaching to L glute/hip on the L shoulder  Time 6   Period Weeks   Status New     PT LONG TERM GOAL #3   Title Patient will be able to reach to T4 level behind her head with her L shoulder in order to more easily scratch her back/neck.    Baseline Painful at C7 level   Time 6   Period Weeks   Status New               Plan - 10/27/16 1107    Clinical Impression Statement Patient demonstrates increased upper trap activation with exercise performance and requires frequent cueing to correct. Patient able to perform greater shoulder flexion after manual therapy and mobility exercises indicating improvement in muscular spasms and patient will benefit from further skilled therapy to return to prior level of function.    Rehab Potential Fair   PT Frequency 2x / week   PT Duration 6 weeks   PT  Treatment/Interventions Cryotherapy;Electrical Stimulation;Moist Heat;Ultrasound;Iontophoresis 4mg /ml Dexamethasone;Therapeutic exercise;Patient/family education;Neuromuscular re-education;Passive range of motion;Manual techniques   PT Next Visit Plan Improve L shoulder AROM/strength   PT Home Exercise Plan HEP: scapular retraction, shoulder flexion, B shoulder ER   Consulted and Agree with Plan of Care Patient      Patient will benefit from skilled therapeutic intervention in order to improve the following deficits and impairments:  Pain, Increased muscle spasms, Decreased coordination, Decreased mobility, Increased fascial restricitons, Improper body mechanics, Decreased activity tolerance, Decreased range of motion, Decreased endurance, Decreased strength, Hypomobility, Impaired UE functional use, Decreased balance, Impaired flexibility  Visit Diagnosis: Muscle weakness (generalized)  Stiffness of left shoulder, not elsewhere classified  Chronic left shoulder pain     Problem List Patient Active Problem List   Diagnosis Date Noted  . Vitamin D deficiency 07/12/2016  . Morbid obesity ([place]) 07/09/2015  . Leg swelling 07/09/2015  . Chest pain 05/08/2015  . Visit for preventive health examination 06/25/2014  . Depression 05/11/2014  . Hepatic steatosis 05/11/2014  . Polyarthritis 05/11/2014  . Shortness of breath 05/17/2013  . Family history of colon cancer requiring screening colonoscopy 05/15/2013  . OSA (obstructive sleep apnea) 05/15/2013  . Annual physical exam 05/15/2013  . Paroxysmal atrial fibrillation ([place]) 03/23/2013  . Laryngospasm 03/01/2012  . Pulmonary nodule, right 03/01/2012  . Hyperlipidemia with target LDL less than 100 02/05/2012  . Chest pain at rest 02/05/2012  . Hypertension 05/15/2011  . Headache, variant migraine 04/18/2011  . Screening for cervical cancer 04/18/2011  . Screening for breast cancer 04/18/2011  . Screening for colon cancer 04/18/2011     Blythe Stanford, PT DPT 10/27/2016, 11:12 AM  [place] MAIN [facility] SERVICES [address] [place], [place], [address] Phone: [phone]   Fax:  [phone]  Name: Florean Hoobler MRN: [id number] Date of Birth: [birth year]

## 2016-10-29 ENCOUNTER — Ambulatory Visit: Payer: 59

## 2016-10-29 DIAGNOSIS — G8929 Other chronic pain: Secondary | ICD-10-CM | POA: Diagnosis not present

## 2016-10-29 DIAGNOSIS — M25512 Pain in left shoulder: Secondary | ICD-10-CM

## 2016-10-29 DIAGNOSIS — M6281 Muscle weakness (generalized): Secondary | ICD-10-CM

## 2016-10-29 DIAGNOSIS — M25612 Stiffness of left shoulder, not elsewhere classified: Secondary | ICD-10-CM | POA: Diagnosis not present

## 2016-10-29 NOTE — Therapy (Signed)
[place] MAIN [facility] SERVICES [address] [place], [place], [address] Phone: [phone]   Fax:  [phone]  Physical Therapy Treatment  Patient Details  Name: Jessica Clayton MRN: [id number] Date of Birth: 1961/07/12 Referring Provider: Dr. Carlynn Spry  Encounter Date: 10/29/2016      PT End of Session - 10/29/16 0923    Visit Number 3   Number of Visits 12   Date for PT Re-Evaluation 12/03/16   PT Start Time 0853   PT Stop Time 0931   PT Time Calculation (min) 38 min   Activity Tolerance Patient tolerated treatment well   Behavior During Therapy [facility] for tasks assessed/performed      Past Medical History:  Diagnosis Date  . Arthritis    left shoulder, injected by Margaretmary Eddy 2012  . Hyperlipidemia    hypertriglyceridemia  . Hypertension   . Menopause, premature    at age 15  . Obesity (BMI 30-39.9)   . PAF (paroxysmal atrial fibrillation) ([place])     Past Surgical History:  Procedure Laterality Date  . CHOLECYSTECTOMY  1982  . FOOT SURGERY Right    tumor  . herniated disc    . TUBAL LIGATION      There were no vitals filed for this visit.      Subjective Assessment - 10/29/16 0919    Subjective Patient reports her shoulder is starting to feel better and states it hurts to move her arm overhead but sporatically.    Limitations Lifting   Patient Stated Goals Improve range of motion without pain   Currently in Pain? Yes   Pain Score 2    Pain Location Shoulder   Pain Orientation Left   Pain Descriptors / Indicators Aching   Pain Type Acute pain   Pain Onset More than a month ago      TREATMENT: Manual Therapy: STM to patient upper traps and cervical extensors with patient positioned in sitting to decrease pain and spasms. Shoulder mobs grade III to A->P, Superior -> inferior; 3 x 30sec  Therapeutic Exercise: Scapular retraction at MATRIX with B UE support - 12.5# 2x15 B shoulder ER with RTB -  x20 Scapular High row in sitting on chair at MATRIX- 17.5# x15 with tactile cueing required throughout movement  Physioball roll outs in sitting - 2 min Serratus punches in standing with RTB - x 20  Straight arm push downs at MATRIX -15# in standing x15   *Patient requires minimal VCs for proper muscle activation and coordination       PT Education - 10/29/16 0922    Education provided Yes   Education Details Form/technique with exercises   Person(s) Educated Patient   Methods Explanation;Demonstration   Comprehension Verbalized understanding;Returned demonstration             PT Long Term Goals - 10/22/16 0930      PT LONG TERM GOAL #1   Title Patient will be able to raise L arm overhead to 180 degrees to demonstrate functional improvements for reaching dinnerware in high cabinets   Baseline L shoulder AROM: 120   Time 6   Period Weeks   Status New     PT LONG TERM GOAL #2   Title Patient will be able to reach T8 level when reaching behind her back in order to donn/doff her bra.   Baseline Painful when reaching to L glute/hip on the L shoulder    Time 6   Period  Weeks   Status New     PT LONG TERM GOAL #3   Title Patient will be able to reach to T4 level behind her head with her L shoulder in order to more easily scratch her back/neck.    Baseline Painful at C7 level   Time 6   Period Weeks   Status New               Plan - 10/29/16 1003    Clinical Impression Statement Patient demonstrates improvement in shoulder flexion movement and strength today with patient with ability to tolerate advanvcement of exercise program without increase in symptoms. Began to incorporate serratus anterior activation and patient will benefit from further skilled therapy to return to prior level of function.    Rehab Potential Fair   PT Frequency 2x / week   PT Duration 6 weeks   PT Treatment/Interventions Cryotherapy;Electrical Stimulation;Moist  Heat;Ultrasound;Iontophoresis 4mg /ml Dexamethasone;Therapeutic exercise;Patient/family education;Neuromuscular re-education;Passive range of motion;Manual techniques   PT Next Visit Plan Improve L shoulder AROM/strength   PT Home Exercise Plan HEP: scapular retraction, shoulder flexion, B shoulder ER   Consulted and Agree with Plan of Care Patient      Patient will benefit from skilled therapeutic intervention in order to improve the following deficits and impairments:  Pain, Increased muscle spasms, Decreased coordination, Decreased mobility, Increased fascial restricitons, Improper body mechanics, Decreased activity tolerance, Decreased range of motion, Decreased endurance, Decreased strength, Hypomobility, Impaired UE functional use, Decreased balance, Impaired flexibility  Visit Diagnosis: Muscle weakness (generalized)  Stiffness of left shoulder, not elsewhere classified  Chronic left shoulder pain     Problem List Patient Active Problem List   Diagnosis Date Noted  . Vitamin D deficiency 07/12/2016  . Morbid obesity ([place]) 07/09/2015  . Leg swelling 07/09/2015  . Chest pain 05/08/2015  . Visit for preventive health examination 06/25/2014  . Depression 05/11/2014  . Hepatic steatosis 05/11/2014  . Polyarthritis 05/11/2014  . Shortness of breath 05/17/2013  . Family history of colon cancer requiring screening colonoscopy 05/15/2013  . OSA (obstructive sleep apnea) 05/15/2013  . Annual physical exam 05/15/2013  . Paroxysmal atrial fibrillation ([place]) 03/23/2013  . Laryngospasm 03/01/2012  . Pulmonary nodule, right 03/01/2012  . Hyperlipidemia with target LDL less than 100 02/05/2012  . Chest pain at rest 02/05/2012  . Hypertension 05/15/2011  . Headache, variant migraine 04/18/2011  . Screening for cervical cancer 04/18/2011  . Screening for breast cancer 04/18/2011  . Screening for colon cancer 04/18/2011    Blythe Stanford, PT DPT 10/29/2016, 10:20 AM  [place] MAIN [facility] SERVICES [address] [place], [place], [address] Phone: [phone]   Fax:  [phone]  Name: Jessica Clayton MRN: [id number] Date of Birth: 03-18-1961

## 2016-10-30 ENCOUNTER — Encounter: Payer: Self-pay | Admitting: Internal Medicine

## 2016-10-31 ENCOUNTER — Encounter: Payer: Self-pay | Admitting: Internal Medicine

## 2016-11-03 ENCOUNTER — Ambulatory Visit: Payer: 59

## 2016-11-03 DIAGNOSIS — M6281 Muscle weakness (generalized): Secondary | ICD-10-CM

## 2016-11-03 DIAGNOSIS — M25512 Pain in left shoulder: Secondary | ICD-10-CM | POA: Diagnosis not present

## 2016-11-03 DIAGNOSIS — M25612 Stiffness of left shoulder, not elsewhere classified: Secondary | ICD-10-CM | POA: Diagnosis not present

## 2016-11-03 DIAGNOSIS — G8929 Other chronic pain: Secondary | ICD-10-CM | POA: Diagnosis not present

## 2016-11-03 NOTE — Telephone Encounter (Signed)
You have 2 labs ordered from 10/17 would you like these prior top appointment?

## 2016-11-03 NOTE — Therapy (Signed)
[place] MAIN [facility] SERVICES [address] [place], [place], [address] Phone: [phone]   Fax:  [phone]  Physical Therapy Treatment  Patient Details  Name: Jessica Clayton MRN: [id number] Date of Birth: 18-Oct-1960 Referring Provider: Dr. Carlynn Spry  Encounter Date: 11/03/2016      PT End of Session - 11/03/16 0924    Visit Number 4   Number of Visits 12   Date for PT Re-Evaluation 12/03/16   PT Start Time 0853   PT Stop Time 0931   PT Time Calculation (min) 38 min   Activity Tolerance Patient tolerated treatment well   Behavior During Therapy [facility] for tasks assessed/performed      Past Medical History:  Diagnosis Date  . Arthritis    left shoulder, injected by Margaretmary Eddy 2012  . Hyperlipidemia    hypertriglyceridemia  . Hypertension   . Menopause, premature    at age 27  . Obesity (BMI 30-39.9)   . PAF (paroxysmal atrial fibrillation) ([place])     Past Surgical History:  Procedure Laterality Date  . CHOLECYSTECTOMY  1982  . FOOT SURGERY Right    tumor  . herniated disc    . TUBAL LIGATION      There were no vitals filed for this visit.      Subjective Assessment - 11/03/16 0922    Subjective Paitent reports her shoulder is feeling better but conitnues to feel a catch at end range.    Limitations Lifting   Patient Stated Goals Improve range of motion without pain   Currently in Pain? No/denies   Pain Onset More than a month ago      TREATMENT: Manual Therapy: STM to patient upper traps and cervical extensors with patient positioned in sitting to decrease pain and spasms. Shoulder mobs grade III to A->P, Superior -> inferior; 3 x 30sec   Therapeutic Exercise: Physioball roll outs in sitting - 2 min Supine shoulder flexion AAROM - x 30 Serratus punches in standing with RTB - x 20  Push up PLUS - x 25 with frequent tactile cueing for proper muscular activation  B shoulder flexion with Blue physioball  - x 20 Scapular retraction at MATRIX with B UE support - 20# 2x20 Scapular High row in sitting on chair at MATRIX- 25# x20 with tactile cueing required throughout movement  Straight arm push downs at MATRIX -15# in standing x15   *Patient requires minimal VCs for proper muscle activation and coordination      PT Education - 11/03/16 0923    Education provided Yes   Education Details form/technique with exercises   Clayton(s) Educated Patient   Methods Demonstration;Explanation   Comprehension Verbalized understanding;Returned demonstration             PT Long Term Goals - 10/22/16 0930      PT LONG TERM GOAL #1   Title Patient will be able to raise L arm overhead to 180 degrees to demonstrate functional improvements for reaching dinnerware in high cabinets   Baseline L shoulder AROM: 120   Time 6   Period Weeks   Status New     PT LONG TERM GOAL #2   Title Patient will be able to reach T8 level when reaching behind her back in order to donn/doff her bra.   Baseline Painful when reaching to L glute/hip on the L shoulder    Time 6   Period Weeks   Status New  PT LONG TERM GOAL #3   Title Patient will be able to reach to T4 level behind her head with her L shoulder in order to more easily scratch her back/neck.    Baseline Painful at C7 level   Time 6   Period Weeks   Status New               Plan - 11/03/16 9892    Clinical Impression Statement Patient requires less VC and TC with performing scapular retraction based exercises indicating functional carryover between visits. Although patient is improving, she continues to demonstrate poor motor control and decreased strength with serratus anterior movements inhibiting overhead movement and patient will benefit from further skilled therapy to return to prior level of function.    Rehab Potential Fair   PT Frequency 2x / week   PT Duration 6 weeks   PT Treatment/Interventions Cryotherapy;Electrical  Stimulation;Moist Heat;Ultrasound;Iontophoresis 4mg /ml Dexamethasone;Therapeutic exercise;Patient/family education;Neuromuscular re-education;Passive range of motion;Manual techniques   PT Next Visit Plan Improve L shoulder AROM/strength   PT Home Exercise Plan HEP: scapular retraction, shoulder flexion, B shoulder ER   Consulted and Agree with Plan of Care Patient      Patient will benefit from skilled therapeutic intervention in order to improve the following deficits and impairments:  Pain, Increased muscle spasms, Decreased coordination, Decreased mobility, Increased fascial restricitons, Improper body mechanics, Decreased activity tolerance, Decreased range of motion, Decreased endurance, Decreased strength, Hypomobility, Impaired UE functional use, Decreased balance, Impaired flexibility  Visit Diagnosis: Muscle weakness (generalized)  Stiffness of left shoulder, not elsewhere classified  Chronic left shoulder pain     Problem List Patient Active Problem List   Diagnosis Date Noted  . Vitamin D deficiency 07/12/2016  . Morbid obesity ([place]) 07/09/2015  . Leg swelling 07/09/2015  . Chest pain 05/08/2015  . Visit for preventive health examination 06/25/2014  . Depression 05/11/2014  . Hepatic steatosis 05/11/2014  . Polyarthritis 05/11/2014  . Shortness of breath 05/17/2013  . Family history of colon cancer requiring screening colonoscopy 05/15/2013  . OSA (obstructive sleep apnea) 05/15/2013  . Annual physical exam 05/15/2013  . Paroxysmal atrial fibrillation ([place]) 03/23/2013  . Laryngospasm 03/01/2012  . Pulmonary nodule, right 03/01/2012  . Hyperlipidemia with target LDL less than 100 02/05/2012  . Chest pain at rest 02/05/2012  . Hypertension 05/15/2011  . Headache, variant migraine 04/18/2011  . Screening for cervical cancer 04/18/2011  . Screening for breast cancer 04/18/2011  . Screening for colon cancer 04/18/2011    Blythe Stanford, PT DPT 11/03/2016, 9:32  AM  [place] MAIN [facility] SERVICES [address] [place], [place], [address] Phone: [phone]   Fax:  [phone]  Name: Jessica Clayton MRN: [id number] Date of Birth: 19-Mar-1961

## 2016-11-04 NOTE — Telephone Encounter (Signed)
Please cancel the afp lab but keep the hepatic panel.  thanks

## 2016-11-05 ENCOUNTER — Ambulatory Visit: Payer: 59

## 2016-11-10 ENCOUNTER — Ambulatory Visit: Payer: 59

## 2016-11-10 DIAGNOSIS — G8929 Other chronic pain: Secondary | ICD-10-CM

## 2016-11-10 DIAGNOSIS — M6281 Muscle weakness (generalized): Secondary | ICD-10-CM | POA: Diagnosis not present

## 2016-11-10 DIAGNOSIS — M25612 Stiffness of left shoulder, not elsewhere classified: Secondary | ICD-10-CM

## 2016-11-10 DIAGNOSIS — M25512 Pain in left shoulder: Secondary | ICD-10-CM | POA: Diagnosis not present

## 2016-11-10 NOTE — Therapy (Signed)
[place] MAIN [facility] SERVICES [address] [place], [place], [address] Phone: [phone]   Fax:  [phone]  Physical Therapy Treatment  Patient Details  Name: Jessica Clayton MRN: [id number] Date of Birth: 1960/12/21 Referring Provider: Dr. Carlynn Spry  Encounter Date: 11/10/2016      PT End of Session - 11/10/16 0826    Visit Number 5   Number of Visits 12   Date for PT Re-Evaluation 12/03/16   PT Start Time 0806   PT Stop Time 0845   PT Time Calculation (min) 39 min   Activity Tolerance Patient tolerated treatment well   Behavior During Therapy [facility] for tasks assessed/performed      Past Medical History:  Diagnosis Date  . Arthritis    left shoulder, injected by Margaretmary Eddy 2012  . Hyperlipidemia    hypertriglyceridemia  . Hypertension   . Menopause, premature    at age 73  . Obesity (BMI 30-39.9)   . PAF (paroxysmal atrial fibrillation) ([place])     Past Surgical History:  Procedure Laterality Date  . CHOLECYSTECTOMY  1982  . FOOT SURGERY Right    tumor  . herniated disc    . TUBAL LIGATION      There were no vitals filed for this visit.      Subjective Assessment - 11/10/16 2500    Subjective Patient rpoerts she shoulder pain was exaccerbated yesterday from insidious onset. Patient reports she tried to "swat" her cat away this morning and reports increased pain after doing so.    Limitations Lifting   Patient Stated Goals Improve range of motion without pain   Currently in Pain? Yes   Pain Score 6    Pain Location Shoulder   Pain Orientation Left   Pain Descriptors / Indicators Aching   Pain Type Acute pain   Pain Onset More than a month ago       TREATMENT: Manual Therapy: STM to patient upper traps and cervical extensors with patient positioned in sitting to decrease pain and spasms. Upper tap stretch x30sec to decrease pain and spasms.   Therapeutic Exercise: Seated physioball roll outs in  sitting - 2 min UE ranger in ranger - x 30sec (stopped secondary to increased pain) Scapular retraction at MATRIX with B UE support - 15# 2x20 Scapular High row in sitting on chair at MATRIX- 25# x20 with tactile cueing required throughout movement  Straight arm push downs at MATRIX -20# in standing x15 Straight arm shoulder adduction at MATRIX - x 20 #10       PT Education - 11/10/16 0825    Education provided Yes   Education Details education on exaccerbation of symptoms, form/technique   Person(s) Educated Patient   Methods Explanation;Demonstration   Comprehension Verbalized understanding;Returned demonstration             PT Long Term Goals - 10/22/16 0930      PT LONG TERM GOAL #1   Title Patient will be able to raise L arm overhead to 180 degrees to demonstrate functional improvements for reaching dinnerware in high cabinets   Baseline L shoulder AROM: 120   Time 6   Period Weeks   Status New     PT LONG TERM GOAL #2   Title Patient will be able to reach T8 level when reaching behind her back in order to donn/doff her bra.   Baseline Painful when reaching to L glute/hip on the L shoulder  Time 6   Period Weeks   Status New     PT LONG TERM GOAL #3   Title Patient will be able to reach to T4 level behind her head with her L shoulder in order to more easily scratch her back/neck.    Baseline Painful at C7 level   Time 6   Period Weeks   Status New               Plan - 11/10/16 0950    Clinical Impression Statement Patient demonstrates exaccerbation of symptoms today which is decreased after performing manual therapy and mobility exercises. Patient demonstrates increased pain with shoulder abduction at ~90 degs which is improved after manual therapy and mobility exercises. Patient will benefit from further skilled therapy focused on improving muscular endurance to return to PLOF.    Rehab Potential Fair   PT Frequency 2x / week   PT Duration 6 weeks    PT Treatment/Interventions Cryotherapy;Electrical Stimulation;Moist Heat;Ultrasound;Iontophoresis 4mg /ml Dexamethasone;Therapeutic exercise;Patient/family education;Neuromuscular re-education;Passive range of motion;Manual techniques   PT Next Visit Plan Improve L shoulder AROM/strength   PT Home Exercise Plan HEP: scapular retraction, shoulder flexion, B shoulder ER   Consulted and Agree with Plan of Care Patient      Patient will benefit from skilled therapeutic intervention in order to improve the following deficits and impairments:  Pain, Increased muscle spasms, Decreased coordination, Decreased mobility, Increased fascial restricitons, Improper body mechanics, Decreased activity tolerance, Decreased range of motion, Decreased endurance, Decreased strength, Hypomobility, Impaired UE functional use, Decreased balance, Impaired flexibility  Visit Diagnosis: Muscle weakness (generalized)  Stiffness of left shoulder, not elsewhere classified  Chronic left shoulder pain     Problem List Patient Active Problem List   Diagnosis Date Noted  . Vitamin D deficiency 07/12/2016  . Morbid obesity ([place]) 07/09/2015  . Leg swelling 07/09/2015  . Chest pain 05/08/2015  . Visit for preventive health examination 06/25/2014  . Depression 05/11/2014  . Hepatic steatosis 05/11/2014  . Polyarthritis 05/11/2014  . Shortness of breath 05/17/2013  . Family history of colon cancer requiring screening colonoscopy 05/15/2013  . OSA (obstructive sleep apnea) 05/15/2013  . Annual physical exam 05/15/2013  . Paroxysmal atrial fibrillation ([place]) 03/23/2013  . Laryngospasm 03/01/2012  . Pulmonary nodule, right 03/01/2012  . Hyperlipidemia with target LDL less than 100 02/05/2012  . Chest pain at rest 02/05/2012  . Hypertension 05/15/2011  . Headache, variant migraine 04/18/2011  . Screening for cervical cancer 04/18/2011  . Screening for breast cancer 04/18/2011  . Screening for colon cancer  04/18/2011    Blythe Stanford, PT DPT 11/10/2016, 9:56 AM  [place] MAIN [facility] SERVICES [address] [place], [place], [address] Phone: [phone]   Fax:  [phone]  Name: Wannetta Langland MRN: [id number] Date of Birth: August 03, 1961

## 2016-11-12 ENCOUNTER — Ambulatory Visit: Payer: 59

## 2016-11-12 ENCOUNTER — Ambulatory Visit (INDEPENDENT_AMBULATORY_CARE_PROVIDER_SITE_OTHER): Payer: 59 | Admitting: Internal Medicine

## 2016-11-12 ENCOUNTER — Encounter: Payer: Self-pay | Admitting: Internal Medicine

## 2016-11-12 VITALS — BP 122/70 | HR 60 | Temp 98.2°F | Resp 16 | Ht 64.0 in | Wt 231.8 lb

## 2016-11-12 DIAGNOSIS — M25512 Pain in left shoulder: Secondary | ICD-10-CM | POA: Diagnosis not present

## 2016-11-12 DIAGNOSIS — G8929 Other chronic pain: Secondary | ICD-10-CM

## 2016-11-12 DIAGNOSIS — M25612 Stiffness of left shoulder, not elsewhere classified: Secondary | ICD-10-CM

## 2016-11-12 DIAGNOSIS — R748 Abnormal levels of other serum enzymes: Secondary | ICD-10-CM | POA: Diagnosis not present

## 2016-11-12 DIAGNOSIS — I10 Essential (primary) hypertension: Secondary | ICD-10-CM | POA: Diagnosis not present

## 2016-11-12 DIAGNOSIS — K760 Fatty (change of) liver, not elsewhere classified: Secondary | ICD-10-CM | POA: Diagnosis not present

## 2016-11-12 DIAGNOSIS — M6281 Muscle weakness (generalized): Secondary | ICD-10-CM | POA: Diagnosis not present

## 2016-11-12 LAB — HEPATIC FUNCTION PANEL
ALBUMIN: 4.4 g/dL (ref 3.5–5.2)
ALK PHOS: 71 U/L (ref 39–117)
ALT: 79 U/L — ABNORMAL HIGH (ref 0–35)
AST: 52 U/L — ABNORMAL HIGH (ref 0–37)
Bilirubin, Direct: 0.1 mg/dL (ref 0.0–0.3)
TOTAL PROTEIN: 7.1 g/dL (ref 6.0–8.3)
Total Bilirubin: 0.6 mg/dL (ref 0.2–1.2)

## 2016-11-12 NOTE — Progress Notes (Signed)
Pre visit review using our clinic review tool, if applicable. No additional management support is needed unless otherwise documented below in the visit note. 

## 2016-11-12 NOTE — Progress Notes (Signed)
Subjective:  Patient ID: Jessica Clayton, female    DOB: 13-Jun-1961  Age: 56 y.o. MRN: [id number]  CC: The primary encounter diagnosis was Elevated liver enzymes. Diagnoses of Morbid obesity ([place]), Hepatic steatosis, and Essential hypertension were also pertinent to this visit.  HPI Jessica Clayton presents for follow up on recent MRI of abdomen and labs   MRI done to rule out renal mass,  Seen on abdominal ultrasound.  Negative mRI,  Fatty liver without masses seen . She notes that her  MGM died of cirrhosis secondary to NASH    Still working night shift at [employer].  Has changed eating habits,  New lifestyle no processed  Foods, no sweet tea.  Following  A low glycemic index diet.  Making spinach smoothies.  Walking 3 times per week. Using milk thistle and turmeric .  Has lost 10 lbs since [employer] PT for impingement of left shoulder.    Normal PAPs due now,  CPE 3 months .    Lab Results  Component Value Date   HGBA1C 5.4 07/09/2016     Outpatient Medications Prior to Visit  Medication Sig Dispense Refill  . aspirin 81 MG tablet Take 81 mg by mouth daily.    . ergocalciferol (DRISDOL) 50000 units capsule Take 1 capsule (50,000 Units total) by mouth once a week. 4 capsule 3  . fluticasone (FLONASE) 50 MCG/ACT nasal spray Place 2 sprays into both nostrils daily. 16 g 6  . furosemide (LASIX) 20 MG tablet Take 1 tablet (20 mg total) by mouth as needed (for SOB and/or leg swelling). 90 tablet 3  . lisinopril (PRINIVIL,ZESTRIL) 10 MG tablet Take 1 tablet (10 mg total) by mouth daily. 90 tablet 0  . meloxicam (MOBIC) 7.5 MG tablet Take 1 tablet (7.5 mg total) by mouth daily. 15 tablet 0  . potassium chloride (K-DUR) 10 MEQ tablet Take 1 tablet (10 mEq total) by mouth daily. 90 tablet 3  . traMADol (ULTRAM) 50 MG tablet Take 1 tablet (50 mg total) by mouth every 6 (six) hours as needed for moderate pain. (Patient not taking: Reported on 11/12/2016) 12 tablet 0   No  facility-administered medications prior to visit.     Review of Systems;  Patient denies headache, fevers, malaise, unintentional weight loss, skin rash, eye pain, sinus congestion and sinus pain, sore throat, dysphagia,  hemoptysis , cough, dyspnea, wheezing, chest pain, palpitations, orthopnea, edema, abdominal pain, nausea, melena, diarrhea, constipation, flank pain, dysuria, hematuria, urinary  Frequency, nocturia, numbness, tingling, seizures,  Focal weakness, Loss of consciousness,  Tremor, insomnia, depression, anxiety, and suicidal ideation.      Objective:  BP 122/70   Pulse 60   Temp 98.2 F (36.8 C) (Oral)   Resp 16   Ht 5\' 4"  (1.626 m)   Wt 231 lb 12.8 oz (105.1 kg)   SpO2 96%   BMI 39.79 kg/m   BP Readings from Last 3 Encounters:  11/12/16 122/70  08/09/16 140/65  07/28/16 130/90    Wt Readings from Last 3 Encounters:  11/12/16 231 lb 12.8 oz (105.1 kg)  08/09/16 220 lb (99.8 kg)  07/09/16 241 lb 4 oz (109.4 kg)    General appearance: alert, cooperative and appears stated age Ears: normal TM's and external ear canals both ears Throat: lips, mucosa, and tongue normal; teeth and gums normal Neck: no adenopathy, no carotid bruit, supple, symmetrical, trachea midline and thyroid not enlarged, symmetric, no tenderness/mass/nodules Back: symmetric, no curvature. ROM  normal. No CVA tenderness. Lungs: clear to auscultation bilaterally Heart: regular rate and rhythm, S1, S2 normal, no murmur, click, rub or gallop Abdomen: soft, non-tender; bowel sounds normal; no masses,  no organomegaly Pulses: 2+ and symmetric Skin: Skin color, texture, turgor normal. No rashes or lesions Lymph nodes: Cervical, supraclavicular, and axillary nodes normal.  Lab Results  Component Value Date   HGBA1C 5.4 07/09/2016    Lab Results  Component Value Date   CREATININE 0.81 07/09/2016   CREATININE 0.81 05/08/2015   CREATININE 0.8 05/09/2014    Lab Results  Component Value Date     WBC 5.4 05/08/2015   HGB 13.2 05/08/2015   HCT 40.0 05/08/2015   PLT 298.0 05/08/2015   GLUCOSE 108 (H) 07/09/2016   CHOL 164 07/09/2016   TRIG 127.0 07/09/2016   HDL 43.10 07/09/2016   LDLCALC 95 07/09/2016   ALT 79 (H) 11/12/2016   AST 52 (H) 11/12/2016   NA 142 07/09/2016   K 4.0 07/09/2016   CL 105 07/09/2016   CREATININE 0.81 07/09/2016   BUN 10 07/09/2016   CO2 29 07/09/2016   TSH 3.12 07/09/2016   HGBA1C 5.4 07/09/2016    Mr Abdomen Wo Contrast  Result Date: 10/27/2016 CLINICAL DATA:  Possible left renal mass seen on recent ultrasound. Hepatic steatosis. EXAM: MRI ABDOMEN WITHOUT CONTRAST TECHNIQUE: Multiplanar multisequence MR imaging was performed without the administration of intravenous contrast. COMPARISON:  Ultrasound on 10/10/2016 FINDINGS: Lower chest: No acute findings. Hepatobiliary: Diffuse hepatic steatosis demonstrated on chemical shift imaging. No liver masses identified. Prior cholecystectomy noted. No evidence of biliary dilatation. Pancreas: No mass or inflammatory process visualized on this unenhanced exam. Spleen:  Within normal limits in size. Adrenals/Urinary tract: Normal adrenal glands. Normal appearance of both kidneys on this unenhanced exam. No evidence of renal masses or hydronephrosis. Stomach/Bowel: No evidence of obstruction, inflammatory process, or abnormal fluid collections. Vascular/Lymphatic: No pathologically enlarged lymph nodes identified. No evidence of abdominal aortic aneurysm. Other:  None. Musculoskeletal:  No suspicious bone lesions identified. IMPRESSION: No evidence of renal neoplasm or hydronephrosis. Diffuse hepatic steatosis. No evidence of hepatic mass or biliary ductal dilatation. Electronically Signed   By: Earle Gell M.D.   On: 10/27/2016 12:47    Assessment & Plan:   Problem List Items Addressed This Visit    Hepatic steatosis    Presumed by ultrasound changes and serologies negative for autoimmune causes of hepatitis.   Current liver enzymes are improving  and all modifiable risk factors including obesity and hyperlipidemia have been addressed with medications and/or advice to follow a low glycemic diet.  She will return for the Hepatitis A/B vaccines      Hypertension    Well controlled on current regimen. Renal function stable, no changes today. Lab Results  Component Value Date   CREATININE 0.81 07/09/2016   Lab Results  Component Value Date   NA 142 07/09/2016   K 4.0 07/09/2016   CL 105 07/09/2016   CO2 29 07/09/2016   No results found for: Derl Barrow       Morbid obesity ([place])    I have congratulated her in reduction of   BMI and encouraged  Continued weight loss with goal of 10% of body weigh over the next 6 months using a low glycemic index diet and regular exercise a minimum of 5 days per week.         Other Visit Diagnoses    Elevated liver enzymes    -  Primary   Relevant Orders   Hepatic function panel (Completed)      I have discontinued Ms. Langseth's traMADol. I am also having her maintain her aspirin, furosemide, potassium chloride, ergocalciferol, fluticasone, meloxicam, and lisinopril.  No orders of the defined types were placed in this encounter.  A total of 25 minutes of face to face time was spent with patient more than half of which was spent in counselling about the above mentioned conditions  and coordination of care  Medications Discontinued During This Encounter  Medication Reason  . traMADol (ULTRAM) 50 MG tablet Patient has not taken in last 30 days    Follow-up: Return in about 3 months (around 02/12/2017), or CPE with PAP, fasting labs prior .   Crecencio Mc, MD

## 2016-11-12 NOTE — Therapy (Signed)
[place] MAIN [facility] SERVICES [address] [place], [place], [address] Phone: [phone]   Fax:  [phone]  Physical Therapy Treatment  Patient Details  Name: Jessica Clayton MRN: [id number] Date of Birth: Nov 06, 1960 Referring Provider: Dr. Carlynn Spry  Encounter Date: 11/12/2016      PT End of Session - 11/12/16 0846    Visit Number 6   Number of Visits 12   Date for PT Re-Evaluation 12/03/16   PT Start Time 0815   PT Stop Time 0845   PT Time Calculation (min) 30 min   Activity Tolerance Patient tolerated treatment well   Behavior During Therapy [facility] for tasks assessed/performed      Past Medical History:  Diagnosis Date  . Arthritis    left shoulder, injected by Margaretmary Eddy 2012  . Hyperlipidemia    hypertriglyceridemia  . Hypertension   . Menopause, premature    at age 81  . Obesity (BMI 30-39.9)   . PAF (paroxysmal atrial fibrillation) ([place])     Past Surgical History:  Procedure Laterality Date  . CHOLECYSTECTOMY  1982  . FOOT SURGERY Right    tumor  . herniated disc    . TUBAL LIGATION      There were no vitals filed for this visit.      Subjective Assessment - 11/12/16 0827    Subjective Patient reports her shoulder is feeling better and reports she currently does not have any pain.    Limitations Lifting   Patient Stated Goals Improve range of motion without pain   Currently in Pain? No/denies   Pain Onset More than a month ago      TREATMENT: Manual Therapy: STM to patient upper traps and cervical extensors with patient positioned in sitting to decrease pain and spasms. Upper tap stretch 2x30sec to decrease pain and spasms.   Therapeutic Exercise: Scapular retraction at MATRIX with B UE support - 27.5# 2x20 Low row in standing at MATRIX 12.5# -- x20 B Shoulder ER with yellow band - x 20 with towel under arm  Scapular High row in sitting on chair at MATRIX- 32.5# x20 with tactile cueing  required throughout movement  Straight arm push downs at MATRIX -20# in standing x15 Overhead shoulder horizontal adduction with YTB - x 20  Sidelying Shoulder ER with 1# dumbbell - 2 x 20 Lat pulldown with straight bar -- #30 at [place]       PT Education - 11/12/16 0842    Education provided Yes   Education Details form/technique with exercise   Person(s) Educated Patient   Methods Explanation;Demonstration   Comprehension Verbalized understanding             PT Long Term Goals - 10/22/16 0930      PT LONG TERM GOAL #1   Title Patient will be able to raise L arm overhead to 180 degrees to demonstrate functional improvements for reaching dinnerware in high cabinets   Baseline L shoulder AROM: 120   Time 6   Period Weeks   Status New     PT LONG TERM GOAL #2   Title Patient will be able to reach T8 level when reaching behind her back in order to donn/doff her bra.   Baseline Painful when reaching to L glute/hip on the L shoulder    Time 6   Period Weeks   Status New     PT LONG TERM GOAL #3   Title Patient will be  able to reach to T4 level behind her head with her L shoulder in order to more easily scratch her back/neck.    Baseline Painful at C7 level   Time 6   Period Weeks   Status New               Plan - 11/12/16 0848    Clinical Impression Statement Focused on improving shoulder and scapular muscular stabilization today as patient demonstrates decreased pain today. Patient demonstrates improvement in strength and muscular endurance today as indicated by tolerating greater resistance with performing exercises indicating functional carryover. Although patient is improving, she continues to demonstrate decreased coordination with pain at end range of motion and will benefit from further skilled therapy focused on improving limitations to return to prior level of function.    Rehab Potential Fair   PT Frequency 2x / week   PT Duration 6 weeks   PT  Treatment/Interventions Cryotherapy;Electrical Stimulation;Moist Heat;Ultrasound;Iontophoresis 4mg /ml Dexamethasone;Therapeutic exercise;Patient/family education;Neuromuscular re-education;Passive range of motion;Manual techniques   PT Next Visit Plan Improve L shoulder AROM/strength   PT Home Exercise Plan HEP: scapular retraction, shoulder flexion, B shoulder ER   Consulted and Agree with Plan of Care Patient      Patient will benefit from skilled therapeutic intervention in order to improve the following deficits and impairments:  Pain, Increased muscle spasms, Decreased coordination, Decreased mobility, Increased fascial restricitons, Improper body mechanics, Decreased activity tolerance, Decreased range of motion, Decreased endurance, Decreased strength, Hypomobility, Impaired UE functional use, Decreased balance, Impaired flexibility  Visit Diagnosis: Muscle weakness (generalized)  Chronic left shoulder pain  Stiffness of left shoulder, not elsewhere classified     Problem List Patient Active Problem List   Diagnosis Date Noted  . Vitamin D deficiency 07/12/2016  . Morbid obesity ([place]) 07/09/2015  . Leg swelling 07/09/2015  . Chest pain 05/08/2015  . Visit for preventive health examination 06/25/2014  . Depression 05/11/2014  . Hepatic steatosis 05/11/2014  . Polyarthritis 05/11/2014  . Shortness of breath 05/17/2013  . Family history of colon cancer requiring screening colonoscopy 05/15/2013  . OSA (obstructive sleep apnea) 05/15/2013  . Annual physical exam 05/15/2013  . Paroxysmal atrial fibrillation ([place]) 03/23/2013  . Laryngospasm 03/01/2012  . Pulmonary nodule, right 03/01/2012  . Hyperlipidemia with target LDL less than 100 02/05/2012  . Chest pain at rest 02/05/2012  . Hypertension 05/15/2011  . Headache, variant migraine 04/18/2011  . Screening for cervical cancer 04/18/2011  . Screening for breast cancer 04/18/2011  . Screening for colon cancer 04/18/2011     Blythe Stanford, PT DPT 11/12/2016, 8:54 AM  [place] MAIN [facility] SERVICES [address] [place], [place], [address] Phone: [phone]   Fax:  [phone]  Name: Jessica Clayton MRN: [id number] Date of Birth: 01-Apr-1961

## 2016-11-12 NOTE — Patient Instructions (Signed)
Jessica Clayton,  You are doing all the right things!    We do not need to send you to a liver clinic unless your liver enzymes are not responding to your lifestyle changes  RTC 3 months for your annual CPE with PAP  ,  Fasting labs prior to visit

## 2016-11-13 ENCOUNTER — Encounter: Payer: Self-pay | Admitting: Internal Medicine

## 2016-11-13 DIAGNOSIS — G4733 Obstructive sleep apnea (adult) (pediatric): Secondary | ICD-10-CM | POA: Diagnosis not present

## 2016-11-13 NOTE — Assessment & Plan Note (Signed)
Well controlled on current regimen. Renal function stable, no changes today. Lab Results  Component Value Date   CREATININE 0.81 07/09/2016   Lab Results  Component Value Date   NA 142 07/09/2016   K 4.0 07/09/2016   CL 105 07/09/2016   CO2 29 07/09/2016   No results found for: Derl Barrow

## 2016-11-13 NOTE — Assessment & Plan Note (Addendum)
Presumed by ultrasound changes and serologies negative for autoimmune causes of hepatitis.  Current liver enzymes are improving  and all modifiable risk factors including obesity and hyperlipidemia have been addressed with medications and/or advice to follow a low glycemic diet.  She will return for the Hepatitis A/B vaccines

## 2016-11-13 NOTE — Assessment & Plan Note (Signed)
I have congratulated her in reduction of   BMI and encouraged  Continued weight loss with goal of 10% of body weigh over the next 6 months using a low glycemic index diet and regular exercise a minimum of 5 days per week.    

## 2016-11-17 ENCOUNTER — Ambulatory Visit: Payer: 59 | Attending: Orthopedic Surgery

## 2016-11-17 DIAGNOSIS — M25512 Pain in left shoulder: Secondary | ICD-10-CM | POA: Insufficient documentation

## 2016-11-17 DIAGNOSIS — G8929 Other chronic pain: Secondary | ICD-10-CM

## 2016-11-17 DIAGNOSIS — M25612 Stiffness of left shoulder, not elsewhere classified: Secondary | ICD-10-CM | POA: Diagnosis not present

## 2016-11-17 DIAGNOSIS — M6281 Muscle weakness (generalized): Secondary | ICD-10-CM

## 2016-11-17 NOTE — Therapy (Signed)
[place] MAIN [facility] SERVICES [address] [place], [place], [address] Phone: [phone]   Fax:  [phone]  Physical Therapy Treatment  Patient Details  Name: Jessica Clayton MRN: [id number] Date of Birth: 07/30/1961 Referring Provider: Dr. Carlynn Spry  Encounter Date: 11/17/2016      PT End of Session - 11/17/16 1450    Visit Number 7   Number of Visits 12   Date for PT Re-Evaluation 12/03/16   PT Start Time 1400   PT Stop Time 1445   PT Time Calculation (min) 45 min   Activity Tolerance Patient tolerated treatment well   Behavior During Therapy [facility] for tasks assessed/performed      Past Medical History:  Diagnosis Date  . Arthritis    left shoulder, injected by Margaretmary Eddy 2012  . Hyperlipidemia    hypertriglyceridemia  . Hypertension   . Menopause, premature    at age 51  . Obesity (BMI 30-39.9)   . PAF (paroxysmal atrial fibrillation) ([place])     Past Surgical History:  Procedure Laterality Date  . CHOLECYSTECTOMY  1982  . FOOT SURGERY Right    tumor  . herniated disc    . TUBAL LIGATION      There were no vitals filed for this visit.      Subjective Assessment - 11/17/16 1448    Subjective Patient reports she had a major headache this am, reports she has had a headache to in a long time. However, patient reports her shoulder is feeling much better.    Limitations Lifting   Patient Stated Goals Improve range of motion without pain   Currently in Pain? No/denies   Pain Onset More than a month ago      TREATMENT: Manual Therapy: STM to patient upper traps and cervical extensors with patient positioned in sitting to decrease pain and spasms.    Therapeutic Exercise: Scapular retraction at MATRIX with B UE support - 27.5# x20 Downward chops B - x 20 12.5# Upward chops B - x 20 7.5# High Row at [facility] with B UE support 32.5# x 20 Overhead shoulder horizontal adduction with YTB - x 20 TRX scapular  retraction pull ups - x 20 with cueing on body position and arm movement throughout EXS Lat pulldown with straight bar -- #30 at High [place]      PT Education - 11/17/16 1449    Education provided Yes   Education Details form/technique with exercise   Person(s) Educated Patient   Methods Explanation;Demonstration   Comprehension Verbalized understanding;Returned demonstration             PT Long Term Goals - 10/22/16 0930      PT LONG TERM GOAL #1   Title Patient will be able to raise L arm overhead to 180 degrees to demonstrate functional improvements for reaching dinnerware in high cabinets   Baseline L shoulder AROM: 120   Time 6   Period Weeks   Status New     PT LONG TERM GOAL #2   Title Patient will be able to reach T8 level when reaching behind her back in order to donn/doff her bra.   Baseline Painful when reaching to L glute/hip on the L shoulder    Time 6   Period Weeks   Status New     PT LONG TERM GOAL #3   Title Patient will be able to reach to T4 level behind her head with her L shoulder in  order to more easily scratch her back/neck.    Baseline Painful at C7 level   Time 6   Period Weeks   Status New               Plan - 11/17/16 1450    Clinical Impression Statement Conitnued to focus on improving shoulder muscular endurance and coordination to allow for performance of overhead ADLs. Patient demosntrates improved muscular endurance and motor control compared to previous visits and pt will benefit from further skilled therapy to return to prior level of function.    Rehab Potential Fair   PT Frequency 2x / week   PT Duration 6 weeks   PT Treatment/Interventions Cryotherapy;Electrical Stimulation;Moist Heat;Ultrasound;Iontophoresis 4mg /ml Dexamethasone;Therapeutic exercise;Patient/family education;Neuromuscular re-education;Passive range of motion;Manual techniques   PT Next Visit Plan Improve L shoulder AROM/strength   PT Home Exercise Plan HEP:  scapular retraction, shoulder flexion, B shoulder ER   Consulted and Agree with Plan of Care Patient      Patient will benefit from skilled therapeutic intervention in order to improve the following deficits and impairments:  Pain, Increased muscle spasms, Decreased coordination, Decreased mobility, Increased fascial restricitons, Improper body mechanics, Decreased activity tolerance, Decreased range of motion, Decreased endurance, Decreased strength, Hypomobility, Impaired UE functional use, Decreased balance, Impaired flexibility  Visit Diagnosis: Muscle weakness (generalized)  Chronic left shoulder pain  Stiffness of left shoulder, not elsewhere classified     Problem List Patient Active Problem List   Diagnosis Date Noted  . Vitamin D deficiency 07/12/2016  . Morbid obesity ([place]) 07/09/2015  . Leg swelling 07/09/2015  . Chest pain 05/08/2015  . Visit for preventive health examination 06/25/2014  . Depression 05/11/2014  . Hepatic steatosis 05/11/2014  . Polyarthritis 05/11/2014  . Shortness of breath 05/17/2013  . Family history of colon cancer requiring screening colonoscopy 05/15/2013  . OSA (obstructive sleep apnea) 05/15/2013  . Annual physical exam 05/15/2013  . Paroxysmal atrial fibrillation ([place]) 03/23/2013  . Laryngospasm 03/01/2012  . Pulmonary nodule, right 03/01/2012  . Hyperlipidemia with target LDL less than 100 02/05/2012  . Chest pain at rest 02/05/2012  . Hypertension 05/15/2011  . Headache, variant migraine 04/18/2011  . Screening for cervical cancer 04/18/2011  . Screening for breast cancer 04/18/2011  . Screening for colon cancer 04/18/2011    Blythe Stanford, PT DPT 11/17/2016, 2:58 PM  [place] MAIN [facility] SERVICES [address] [place], [place], [address] Phone: [phone]   Fax:  [phone]  Name: Jessica Clayton MRN: [id number] Date of Birth: Jul 30, 1961

## 2016-11-19 ENCOUNTER — Ambulatory Visit: Payer: 59

## 2016-11-19 DIAGNOSIS — M6281 Muscle weakness (generalized): Secondary | ICD-10-CM

## 2016-11-19 DIAGNOSIS — M25512 Pain in left shoulder: Secondary | ICD-10-CM | POA: Diagnosis not present

## 2016-11-19 DIAGNOSIS — G8929 Other chronic pain: Secondary | ICD-10-CM | POA: Diagnosis not present

## 2016-11-19 DIAGNOSIS — M25612 Stiffness of left shoulder, not elsewhere classified: Secondary | ICD-10-CM | POA: Diagnosis not present

## 2016-11-19 NOTE — Therapy (Signed)
[place] MAIN [facility] SERVICES [address] [place], [place], [address] Phone: [phone]   Fax:  [phone]  Physical Therapy Treatment  Patient Details  Name: Jessica Clayton MRN: [id number] Date of Birth: 02-May-1961 Referring Provider: Dr. Carlynn Spry  Encounter Date: 11/19/2016      PT End of Session - 11/19/16 1731    Visit Number 8   Number of Visits 12   Date for PT Re-Evaluation 12/03/16   PT Start Time 1645   PT Stop Time 1730   PT Time Calculation (min) 45 min   Activity Tolerance Patient tolerated treatment well   Behavior During Therapy [facility] for tasks assessed/performed      Past Medical History:  Diagnosis Date  . Arthritis    left shoulder, injected by Margaretmary Eddy 2012  . Hyperlipidemia    hypertriglyceridemia  . Hypertension   . Menopause, premature    at age 49  . Obesity (BMI 30-39.9)   . PAF (paroxysmal atrial fibrillation) ([place])     Past Surgical History:  Procedure Laterality Date  . CHOLECYSTECTOMY  1982  . FOOT SURGERY Right    tumor  . herniated disc    . TUBAL LIGATION      There were no vitals filed for this visit.      Subjective Assessment - 11/19/16 1730    Subjective Patient reports no current shoulder pain but reports the shoulder feels stiff.    Limitations Lifting   Patient Stated Goals Improve range of motion without pain   Currently in Pain? No/denies   Pain Onset More than a month ago      TREATMENT: Manual Therapy: STM to patient upper traps and cervical extensors with patient positioned in sitting to decrease pain and spasms. Inferior, A->P glides grade IV 3 x 30 sec   Therapeutic Exercise: Scapular retraction at MATRIX with B UE support - 25# x20 Lat pulldown with straight bar -- #30 at [place] x20 Overhead shoulder horizontal adduction with RTB with shoulder flexion- x 20 B shoulder ER with RTB - x 20 with towels under B arms Serratus punches with RTB - x 30   Push up PLUS against wall - x30 Y & W's on the wall - x 10 Horizontal Abduction in standing for 25 sec       PT Education - 11/19/16 1730    Education provided Yes   Education Details TC's and VC's for shoulder muscle activation with exercise performance   Person(s) Educated Patient   Methods Explanation;Demonstration   Comprehension Verbalized understanding;Returned demonstration             PT Long Term Goals - 10/22/16 0930      PT LONG TERM GOAL #1   Title Patient will be able to raise L arm overhead to 180 degrees to demonstrate functional improvements for reaching dinnerware in high cabinets   Baseline L shoulder AROM: 120   Time 6   Period Weeks   Status New     PT LONG TERM GOAL #2   Title Patient will be able to reach T8 level when reaching behind her back in order to donn/doff her bra.   Baseline Painful when reaching to L glute/hip on the L shoulder    Time 6   Period Weeks   Status New     PT LONG TERM GOAL #3   Title Patient will be able to reach to T4 level behind her head with her L  shoulder in order to more easily scratch her back/neck.    Baseline Painful at C7 level   Time 6   Period Weeks   Status New               Plan - 11/19/16 1731    Clinical Impression Statement Performed manual therapy to decrease shoulder stiffness and improve overhead lifting with L UE. Patient demonstrates improved muscular coordination and endurance with exercises and will benefit from further skilled therapy to improve shoulder strength to return to prior level of function.    Rehab Potential Fair   PT Frequency 2x / week   PT Duration 6 weeks   PT Treatment/Interventions Cryotherapy;Electrical Stimulation;Moist Heat;Ultrasound;Iontophoresis 4mg /ml Dexamethasone;Therapeutic exercise;Patient/family education;Neuromuscular re-education;Passive range of motion;Manual techniques   PT Next Visit Plan Improve L shoulder AROM/strength   PT Home Exercise Plan HEP:  scapular retraction, shoulder flexion, B shoulder ER   Consulted and Agree with Plan of Care Patient      Patient will benefit from skilled therapeutic intervention in order to improve the following deficits and impairments:  Pain, Increased muscle spasms, Decreased coordination, Decreased mobility, Increased fascial restricitons, Improper body mechanics, Decreased activity tolerance, Decreased range of motion, Decreased endurance, Decreased strength, Hypomobility, Impaired UE functional use, Decreased balance, Impaired flexibility  Visit Diagnosis: Muscle weakness (generalized)  Chronic left shoulder pain  Stiffness of left shoulder, not elsewhere classified     Problem List Patient Active Problem List   Diagnosis Date Noted  . Vitamin D deficiency 07/12/2016  . Morbid obesity ([place]) 07/09/2015  . Leg swelling 07/09/2015  . Chest pain 05/08/2015  . Visit for preventive health examination 06/25/2014  . Depression 05/11/2014  . Hepatic steatosis 05/11/2014  . Polyarthritis 05/11/2014  . Shortness of breath 05/17/2013  . Family history of colon cancer requiring screening colonoscopy 05/15/2013  . OSA (obstructive sleep apnea) 05/15/2013  . Annual physical exam 05/15/2013  . Paroxysmal atrial fibrillation ([place]) 03/23/2013  . Laryngospasm 03/01/2012  . Pulmonary nodule, right 03/01/2012  . Hyperlipidemia with target LDL less than 100 02/05/2012  . Chest pain at rest 02/05/2012  . Hypertension 05/15/2011  . Headache, variant migraine 04/18/2011  . Screening for cervical cancer 04/18/2011  . Screening for breast cancer 04/18/2011  . Screening for colon cancer 04/18/2011    Blythe Stanford, PT DPT 11/19/2016, 5:33 PM  [place] MAIN [facility] SERVICES [address] [place], [place], [address] Phone: [phone]   Fax:  [phone]  Name: Lacrecia Delval MRN: [id number] Date of Birth: [birth year]

## 2016-11-24 ENCOUNTER — Ambulatory Visit: Payer: 59

## 2016-11-24 DIAGNOSIS — G8929 Other chronic pain: Secondary | ICD-10-CM

## 2016-11-24 DIAGNOSIS — M25612 Stiffness of left shoulder, not elsewhere classified: Secondary | ICD-10-CM

## 2016-11-24 DIAGNOSIS — M6281 Muscle weakness (generalized): Secondary | ICD-10-CM | POA: Diagnosis not present

## 2016-11-24 DIAGNOSIS — M25512 Pain in left shoulder: Secondary | ICD-10-CM | POA: Diagnosis not present

## 2016-11-24 NOTE — Therapy (Signed)
[place] MAIN [facility] SERVICES [address] [place], [place], [address] Phone: [phone]   Fax:  [phone]  Physical Therapy Treatment  Patient Details  Name: Jessica Clayton MRN: [id number] Date of Birth: [birth year] Referring Provider: Dr. Carlynn Spry  Encounter Date: 11/24/2016      PT End of Session - 11/24/16 0829    Visit Number 9   Number of Visits 12   Date for PT Re-Evaluation 12/03/16   PT Start Time 0806   PT Stop Time 0845   PT Time Calculation (min) 39 min   Activity Tolerance Patient tolerated treatment well   Behavior During Therapy [facility] for tasks assessed/performed      Past Medical History:  Diagnosis Date  . Arthritis    left shoulder, injected by Margaretmary Eddy 2012  . Hyperlipidemia    hypertriglyceridemia  . Hypertension   . Menopause, premature    at age 20  . Obesity (BMI 30-39.9)   . PAF (paroxysmal atrial fibrillation) ([place])     Past Surgical History:  Procedure Laterality Date  . CHOLECYSTECTOMY  1982  . FOOT SURGERY Right    tumor  . herniated disc    . TUBAL LIGATION      There were no vitals filed for this visit.      Subjective Assessment - 11/24/16 0825    Subjective Patient reports increased shoulder pain after working, states the pain is increased along her upper trap/shoulder   Limitations Lifting   Patient Stated Goals Improve range of motion without pain   Currently in Pain? Yes   Pain Score 7    Pain Location Shoulder   Pain Orientation Left   Pain Descriptors / Indicators Aching   Pain Type Acute pain   Pain Onset More than a month ago   Pain Frequency Intermittent       TREATMENT: Manual Therapy: STM to patient upper traps and cervical extensors with patient positioned in sitting to decrease pain and spasms. Sidelying STM to upper traps with UT stretch 30sec x 3    Therapeutic Exercise: Scapular retraction at MATRIX with B UE support - 17.5# 2x25 Seated High Row  in sitting at MATRIX with B UE support - 22.5# 2 x 25  Standing B Shoulder ER with RTB - 2 x 25  Serratus punches with RTB - x 30  Overhead shoulder horizontal adduction with RTB with shoulder flexion- x 20 Ball roll outs with physioball with blue ball -x29min  Thoracic extension over the chair with towel on back - 3 min        PT Education - 11/24/16 0828    Education provided Yes   Education Details form/technique with exercises   Person(s) Educated Patient   Methods Explanation;Demonstration   Comprehension Verbalized understanding;Returned demonstration             PT Long Term Goals - 10/22/16 0930      PT LONG TERM GOAL #1   Title Patient will be able to raise L arm overhead to 180 degrees to demonstrate functional improvements for reaching dinnerware in high cabinets   Baseline L shoulder AROM: 120   Time 6   Period Weeks   Status New     PT LONG TERM GOAL #2   Title Patient will be able to reach T8 level when reaching behind her back in order to donn/doff her bra.   Baseline Painful when reaching to L glute/hip on the L shoulder  Time 6   Period Weeks   Status New     PT LONG TERM GOAL #3   Title Patient will be able to reach to T4 level behind her head with her L shoulder in order to more easily scratch her back/neck.    Baseline Painful at C7 level   Time 6   Period Weeks   Status New               Plan - 11/24/16 0831    Clinical Impression Statement Patinet demonstrates exaccerbation of pain upon returning to clinic and focused on performing manual therapy and mobility based exercises to decrease pain. Patient demonstrates decreased pain by the end of the session indicating improved motor control and coordination. Patient will benefit from further skilled therapy to return to prior level of function.    Rehab Potential Fair   PT Frequency 2x / week   PT Duration 6 weeks   PT Treatment/Interventions Cryotherapy;Electrical Stimulation;Moist  Heat;Ultrasound;Iontophoresis 4mg /ml Dexamethasone;Therapeutic exercise;Patient/family education;Neuromuscular re-education;Passive range of motion;Manual techniques   PT Next Visit Plan Improve L shoulder AROM/strength   PT Home Exercise Plan HEP: scapular retraction, shoulder flexion, B shoulder ER   Consulted and Agree with Plan of Care Patient      Patient will benefit from skilled therapeutic intervention in order to improve the following deficits and impairments:  Pain, Increased muscle spasms, Decreased coordination, Decreased mobility, Increased fascial restricitons, Improper body mechanics, Decreased activity tolerance, Decreased range of motion, Decreased endurance, Decreased strength, Hypomobility, Impaired UE functional use, Decreased balance, Impaired flexibility  Visit Diagnosis: Muscle weakness (generalized)  Chronic left shoulder pain  Stiffness of left shoulder, not elsewhere classified     Problem List Patient Active Problem List   Diagnosis Date Noted  . Vitamin D deficiency 07/12/2016  . Morbid obesity ([place]) 07/09/2015  . Leg swelling 07/09/2015  . Chest pain 05/08/2015  . Visit for preventive health examination 06/25/2014  . Depression 05/11/2014  . Hepatic steatosis 05/11/2014  . Polyarthritis 05/11/2014  . Shortness of breath 05/17/2013  . Family history of colon cancer requiring screening colonoscopy 05/15/2013  . OSA (obstructive sleep apnea) 05/15/2013  . Annual physical exam 05/15/2013  . Paroxysmal atrial fibrillation ([place]) 03/23/2013  . Laryngospasm 03/01/2012  . Pulmonary nodule, right 03/01/2012  . Hyperlipidemia with target LDL less than 100 02/05/2012  . Chest pain at rest 02/05/2012  . Hypertension 05/15/2011  . Headache, variant migraine 04/18/2011  . Screening for cervical cancer 04/18/2011  . Screening for breast cancer 04/18/2011  . Screening for colon cancer 04/18/2011    Blythe Stanford, PT DPT 11/24/2016, 8:45 AM  [place] MAIN [facility] SERVICES [address] [place], [place], [address] Phone: [phone]   Fax:  [phone]  Name: Jessica Clayton MRN: [id number] Date of Birth: 24-Jul-1961

## 2016-11-28 ENCOUNTER — Ambulatory Visit: Payer: 59

## 2016-12-01 ENCOUNTER — Ambulatory Visit: Payer: 59

## 2016-12-03 ENCOUNTER — Ambulatory Visit: Payer: 59

## 2016-12-08 ENCOUNTER — Ambulatory Visit: Payer: 59 | Admitting: Physical Therapy

## 2016-12-08 ENCOUNTER — Encounter: Payer: Self-pay | Admitting: Physical Therapy

## 2016-12-08 DIAGNOSIS — M25612 Stiffness of left shoulder, not elsewhere classified: Secondary | ICD-10-CM

## 2016-12-08 DIAGNOSIS — M6281 Muscle weakness (generalized): Secondary | ICD-10-CM

## 2016-12-08 DIAGNOSIS — G8929 Other chronic pain: Secondary | ICD-10-CM

## 2016-12-08 DIAGNOSIS — M25512 Pain in left shoulder: Secondary | ICD-10-CM | POA: Diagnosis not present

## 2016-12-08 NOTE — Therapy (Signed)
[place] MAIN [facility] SERVICES [address] [place], [place], [address] Phone: [phone]   Fax:  [phone]  Physical Therapy Treatment  Patient Details  Name: Jessica Clayton MRN: [id number] Date of Birth: [birth year] Referring Provider: Dr. Carlynn Spry  Encounter Date: 12/08/2016      PT End of Session - 12/08/16 1428    Visit Number 10   Number of Visits 20   Date for PT Re-Evaluation 01/05/17   PT Start Time 1346   PT Stop Time 1427   PT Time Calculation (min) 41 min   Activity Tolerance Patient tolerated treatment well   Behavior During Therapy [facility] for tasks assessed/performed      Past Medical History:  Diagnosis Date  . Arthritis    left shoulder, injected by Margaretmary Eddy 2012  . Hyperlipidemia    hypertriglyceridemia  . Hypertension   . Menopause, premature    at age 70  . Obesity (BMI 30-39.9)   . PAF (paroxysmal atrial fibrillation) ([place])     Past Surgical History:  Procedure Laterality Date  . CHOLECYSTECTOMY  1982  . FOOT SURGERY Right    tumor  . herniated disc    . TUBAL LIGATION      There were no vitals filed for this visit.      Subjective Assessment - 12/08/16 1353    Subjective Pt reports no changes since last session.  Pt reports at work when her L arm is just hanging by her side it begins to ache.  She reports this improves when she rests her arms on armrests of her chair or places towel roll under L armpit.  She reports that she notices her pain most when reaching out to the side.   Limitations Lifting   Patient Stated Goals Improve range of motion without pain   Currently in Pain? No/denies   Pain Onset --   Multiple Pain Sites No        L shoulder flexion AROM: 141 deg painfree   Functional reaching behind back: L1  Functional reaching behind head: T2 with pain in posterior shoulder   MMT L shoulder:  F: 4-/5 with mild pain in posterior L shoulder  E: 4+/5 painfree  Abd:  4+/5 with mild pain in posterior L shoulder  IR: 4+/5 painfree  ER: 4/5 painfree       Manual Therapy:  STM to L UT as increase muscular tension noted x4 minutes    Therapeutic Exercise:  Seated L UT stretch 30sec x 3 with L hand pulling gently on underside of mat table for gentle stretch  Standing L shoulder Abd with RTB in painfree range 3x10  Scapular retraction at MATRIX with B UE support - 17.5# 2x25  Theraball on wall with push up plus and walking ball up, down, left, and right x10 each direction  Standing L Shoulder ER with GTB - 3x15              PT Education - 12/08/16 1348    Education provided Yes   Education Details Exercise technique, posture   Person(s) Educated Patient   Methods Explanation;Demonstration   Comprehension Verbalized understanding;Returned demonstration;Need further instruction             PT Long Term Goals - 12/08/16 1436      PT LONG TERM GOAL #1   Title Patient will be able to raise L arm overhead to 180 degrees to demonstrate functional improvements for reaching dinnerware in  high cabinets   Baseline L shoulder AROM: 120.  12/08/16: 141 deg   Time 4   Period Weeks   Status On-going     PT LONG TERM GOAL #2   Title Patient will be able to reach T8 level when reaching behind her back in order to donn/doff her bra.   Baseline Painful when reaching to L glute/hip on the L shoulder.  12/08/16: L1   Time 4   Period Weeks   Status On-going     PT LONG TERM GOAL #3   Title Patient will be able to reach to T4 level behind her head with her L shoulder in order to more easily scratch her back/neck.    Baseline Painful at C7 level.  12/08/16: T2 with pain   Time 4   Period Weeks   Status On-going     PT LONG TERM GOAL #4   Title Pt will be able to perform reaching activities out to L side with LUE without pain to improve ability to peform functional activities   Baseline 12/08/16: Painful   Time 4   Period Weeks   Status New                Plan - 12/08/16 1430    Clinical Impression Statement Pt presents with improvement in L shoulder strength, especially with shoulder abduction.  She additionally demonstrated significant improvement in functional reach behind head and behind back and is responding well to therapy.  She, however, still demonstrates some weakness in L shoulder strength, and reports pain with activities including reaching out to her side and an ache when sitting for long periods of time at work.  She will benefit from continued skilled PT interventions to improve strength, ROM, posture, and to decrease pain with functional activities.   Rehab Potential Fair   PT Frequency 2x / week   PT Duration 4 weeks   PT Treatment/Interventions Cryotherapy;Electrical Stimulation;Moist Heat;Ultrasound;Iontophoresis 4mg /ml Dexamethasone;Therapeutic exercise;Patient/family education;Neuromuscular re-education;Passive range of motion;Manual techniques;Dry needling;Taping   PT Next Visit Plan Improve L shoulder AROM/strength and functional use   PT Home Exercise Plan HEP: scapular retraction, shoulder flexion, B shoulder ER   Consulted and Agree with Plan of Care Patient      Patient will benefit from skilled therapeutic intervention in order to improve the following deficits and impairments:  Pain, Increased muscle spasms, Decreased coordination, Decreased mobility, Increased fascial restricitons, Improper body mechanics, Decreased activity tolerance, Decreased range of motion, Decreased endurance, Decreased strength, Hypomobility, Impaired UE functional use, Decreased balance, Impaired flexibility  Visit Diagnosis: Muscle weakness (generalized)  Chronic left shoulder pain  Stiffness of left shoulder, not elsewhere classified     Problem List Patient Active Problem List   Diagnosis Date Noted  . Vitamin D deficiency 07/12/2016  . Morbid obesity ([place]) 07/09/2015  . Leg swelling 07/09/2015  . Chest pain  05/08/2015  . Visit for preventive health examination 06/25/2014  . Depression 05/11/2014  . Hepatic steatosis 05/11/2014  . Polyarthritis 05/11/2014  . Shortness of breath 05/17/2013  . Family history of colon cancer requiring screening colonoscopy 05/15/2013  . OSA (obstructive sleep apnea) 05/15/2013  . Annual physical exam 05/15/2013  . Paroxysmal atrial fibrillation ([place]) 03/23/2013  . Laryngospasm 03/01/2012  . Pulmonary nodule, right 03/01/2012  . Hyperlipidemia with target LDL less than 100 02/05/2012  . Chest pain at rest 02/05/2012  . Hypertension 05/15/2011  . Headache, variant migraine 04/18/2011  . Screening for cervical cancer 04/18/2011  . Screening for  breast cancer 04/18/2011  . Screening for colon cancer 04/18/2011    Collie Siad PT, DPT 12/08/2016, 2:39 PM  [place] MAIN [facility] SERVICES [address] [place], [place], [address] Phone: [phone]   Fax:  [phone]  Name: Jessica Clayton MRN: [id number] Date of Birth: 11-04-1960

## 2016-12-10 ENCOUNTER — Ambulatory Visit: Payer: 59

## 2016-12-10 VITALS — BP 139/66 | HR 58

## 2016-12-10 DIAGNOSIS — M6281 Muscle weakness (generalized): Secondary | ICD-10-CM

## 2016-12-10 DIAGNOSIS — M25512 Pain in left shoulder: Secondary | ICD-10-CM | POA: Diagnosis not present

## 2016-12-10 DIAGNOSIS — G8929 Other chronic pain: Secondary | ICD-10-CM | POA: Diagnosis not present

## 2016-12-10 DIAGNOSIS — M25612 Stiffness of left shoulder, not elsewhere classified: Secondary | ICD-10-CM | POA: Diagnosis not present

## 2016-12-10 NOTE — Therapy (Signed)
[place] MAIN [facility] SERVICES [address] [place], [place], [address] Phone: [phone]   Fax:  [phone]  Physical Therapy Treatment  Patient Details  Name: Jessica Clayton MRN: [id number] Date of Birth: Dec 22, 1960 Referring Provider: Dr. Carlynn Spry  Encounter Date: 12/10/2016      PT End of Session - 12/10/16 0805    Visit Number 11   Number of Visits 20   Date for PT Re-Evaluation 01/05/17   PT Start Time 0800   PT Stop Time 0844   PT Time Calculation (min) 44 min   Activity Tolerance Patient tolerated treatment well   Behavior During Therapy [facility] for tasks assessed/performed      Past Medical History:  Diagnosis Date  . Arthritis    left shoulder, injected by Margaretmary Eddy 2012  . Hyperlipidemia    hypertriglyceridemia  . Hypertension   . Menopause, premature    at age 41  . Obesity (BMI 30-39.9)   . PAF (paroxysmal atrial fibrillation) ([place])     Past Surgical History:  Procedure Laterality Date  . CHOLECYSTECTOMY  1982  . FOOT SURGERY Right    tumor  . herniated disc    . TUBAL LIGATION      Vitals:   12/10/16 0805  BP: 139/66  Pulse: (!) 58  SpO2: 99%        Subjective Assessment - 12/10/16 0804    Subjective Pt reports she is doing well on this date. She denies any L shoulder pain currently. HEP is going well. No specific questions or concerns currently. Reports approximately 80% improvement in L shoulder symptoms since starting therapy.    Limitations Lifting   Patient Stated Goals Improve range of motion without pain   Currently in Pain? No/denies           TREATMENT:  Therapeutic Exercise Warm-up on UBE 2 minutes forward/ 2 minutes backward during history (2 minutes unbilled); Assessment of L shoulder AROM/PROM and strength in sitting and then supine, L shoulder HBB is limited compared to R shoulder; Seated L shoulder IR towel stretch 30s x 3 (added to HEP); Standing B Shoulder ER  with RTB x15, x20;  Standing low rows with RTB 2 x 20; Dynamic hugs with GTB 2 x 20; Wall slides for flexion with forearms parallel with UT lift 2 x 15; Ball circles on wall at shoulder height LUE only CW/CCW x 20 each; Standing Matrix L shoulder ER 5# x 12, x 10, to fatigue; Pt issued L shoulder IR towel stretch to add to HEP, she requires frequent corrective cues throughout session for proper form;                    PT Education - 12/10/16 0804    Education provided Yes   Education Details HEP progressed, form/technique correction   Person(s) Educated Patient   Methods Explanation;Demonstration;Verbal cues;Handout   Comprehension Verbalized understanding;Returned demonstration             PT Long Term Goals - 12/08/16 1436      PT LONG TERM GOAL #1   Title Patient will be able to raise L arm overhead to 180 degrees to demonstrate functional improvements for reaching dinnerware in high cabinets   Baseline L shoulder AROM: 120.  12/08/16: 141 deg   Time 4   Period Weeks   Status On-going     PT LONG TERM GOAL #2   Title Patient will be able to reach  T8 level when reaching behind her back in order to donn/doff her bra.   Baseline Painful when reaching to L glute/hip on the L shoulder.  12/08/16: L1   Time 4   Period Weeks   Status On-going     PT LONG TERM GOAL #3   Title Patient will be able to reach to T4 level behind her head with her L shoulder in order to more easily scratch her back/neck.    Baseline Painful at C7 level.  12/08/16: T2 with pain   Time 4   Period Weeks   Status On-going     PT LONG TERM GOAL #4   Title Pt will be able to perform reaching activities out to L side with LUE without pain to improve ability to peform functional activities   Baseline 12/08/16: Painful   Time 4   Period Weeks   Status New               Plan - 12/10/16 0846    Clinical Impression Statement Pt denies pain throughout therapy session today. She  requires frequent cues for correct form during exercises but is able to correct with instruction. She continues to report some pain/difficulty with L shoulder IR and horizontal abduction. She is lacking some L shoulder IR when reaching behind her back so issued a L shoulder IR towel stretch. Encouraged to continue additional HEP and follow-up as scheduled.    Rehab Potential Fair   PT Frequency 2x / week   PT Duration 4 weeks   PT Treatment/Interventions Cryotherapy;Electrical Stimulation;Moist Heat;Ultrasound;Iontophoresis 4mg /ml Dexamethasone;Therapeutic exercise;Patient/family education;Neuromuscular re-education;Passive range of motion;Manual techniques;Dry needling;Taping   PT Next Visit Plan Improve L shoulder AROM/strength and functional use   PT Home Exercise Plan HEP: scapular retraction, shoulder flexion, B shoulder ER, L shoulder IR towel stretch;   Consulted and Agree with Plan of Care Patient      Patient will benefit from skilled therapeutic intervention in order to improve the following deficits and impairments:  Pain, Increased muscle spasms, Decreased coordination, Decreased mobility, Increased fascial restricitons, Improper body mechanics, Decreased activity tolerance, Decreased range of motion, Decreased endurance, Decreased strength, Hypomobility, Impaired UE functional use, Decreased balance, Impaired flexibility  Visit Diagnosis: Muscle weakness (generalized)  Chronic left shoulder pain     Problem List Patient Active Problem List   Diagnosis Date Noted  . Vitamin D deficiency 07/12/2016  . Morbid obesity ([place]) 07/09/2015  . Leg swelling 07/09/2015  . Chest pain 05/08/2015  . Visit for preventive health examination 06/25/2014  . Depression 05/11/2014  . Hepatic steatosis 05/11/2014  . Polyarthritis 05/11/2014  . Shortness of breath 05/17/2013  . Family history of colon cancer requiring screening colonoscopy 05/15/2013  . OSA (obstructive sleep apnea) 05/15/2013   . Annual physical exam 05/15/2013  . Paroxysmal atrial fibrillation ([place]) 03/23/2013  . Laryngospasm 03/01/2012  . Pulmonary nodule, right 03/01/2012  . Hyperlipidemia with target LDL less than 100 02/05/2012  . Chest pain at rest 02/05/2012  . Hypertension 05/15/2011  . Headache, variant migraine 04/18/2011  . Screening for cervical cancer 04/18/2011  . Screening for breast cancer 04/18/2011  . Screening for colon cancer 04/18/2011   Phillips Grout PT, DPT   Leitha Hyppolite 12/10/2016, 12:42 PM  [place] MAIN [facility] SERVICES [address] [place], [place], [address] Phone: [phone]   Fax:  [phone]  Name: Suzana Sohail MRN: [id number] Date of Birth: [birth year]

## 2016-12-15 ENCOUNTER — Ambulatory Visit: Payer: 59

## 2016-12-18 ENCOUNTER — Ambulatory Visit: Payer: 59 | Attending: Orthopedic Surgery

## 2017-02-11 DIAGNOSIS — H6503 Acute serous otitis media, bilateral: Secondary | ICD-10-CM | POA: Diagnosis not present

## 2017-03-04 ENCOUNTER — Ambulatory Visit (INDEPENDENT_AMBULATORY_CARE_PROVIDER_SITE_OTHER): Payer: 59 | Admitting: Internal Medicine

## 2017-03-04 ENCOUNTER — Other Ambulatory Visit (HOSPITAL_COMMUNITY)
Admission: RE | Admit: 2017-03-04 | Discharge: 2017-03-04 | Disposition: A | Discharge status: Home / Self Care | Payer: 59 | Source: Ambulatory Visit | Attending: Internal Medicine | Admitting: Internal Medicine

## 2017-03-04 VITALS — BP 108/62 | HR 53 | Temp 98.5°F | Resp 16 | Ht 64.0 in | Wt 225.0 lb

## 2017-03-04 DIAGNOSIS — I10 Essential (primary) hypertension: Secondary | ICD-10-CM

## 2017-03-04 DIAGNOSIS — Z23 Encounter for immunization: Secondary | ICD-10-CM | POA: Diagnosis not present

## 2017-03-04 DIAGNOSIS — E785 Hyperlipidemia, unspecified: Secondary | ICD-10-CM

## 2017-03-04 DIAGNOSIS — Z0000 Encounter for general adult medical examination without abnormal findings: Secondary | ICD-10-CM | POA: Diagnosis not present

## 2017-03-04 DIAGNOSIS — Z124 Encounter for screening for malignant neoplasm of cervix: Secondary | ICD-10-CM | POA: Insufficient documentation

## 2017-03-04 DIAGNOSIS — K760 Fatty (change of) liver, not elsewhere classified: Secondary | ICD-10-CM

## 2017-03-04 DIAGNOSIS — Z800 Family history of malignant neoplasm of digestive organs: Secondary | ICD-10-CM | POA: Diagnosis not present

## 2017-03-04 MED ORDER — LIRAGLUTIDE -WEIGHT MANAGEMENT 18 MG/3ML ~~LOC~~ SOPN: 0.6000 mg | PEN_INJECTOR | Freq: Every day | SUBCUTANEOUS | 0 refills | Status: DC

## 2017-03-04 MED ORDER — PEN NEEDLES 31G X 6 MM MISC: 0 refills | Status: DC

## 2017-03-04 NOTE — Assessment & Plan Note (Signed)
Mother had colon ca at age 57  Patient is due for 5 year folow up, discussed referral to Dr Allen Norris

## 2017-03-04 NOTE — Progress Notes (Signed)
Patient ID: Jessica Clayton, female    DOB: [birth year]  Age: 56 y.o. MRN: [id number]  The patient is here for annual preventive  examination and management of other chronic and acute problems.  PAP normal 2013 Colonoscopy 2013 mammogram2018 tdap 2014  Has lost 6 more lbs since April .  High was 241, she is  now 225    The risk factors are reflected in the social history.  The roster of all physicians providing medical care to patient - is listed in the Snapshot section of the chart.  Activities of daily living:  The patient is 100% independent in all ADLs: dressing, toileting, feeding as well as independent mobility  Home safety : The patient has smoke detectors in the home. They wear seatbelts.  There are no firearms at home. There is no violence in the home.   There is no risks for hepatitis, STDs or HIV. There is no   history of blood transfusion. They have no travel history to infectious disease endemic areas of the world.  The patient has seen their dentist in the last six month. They have seen their eye doctor in the last year. They admit to slight hearing difficulty with regard to whispered voices and some television programs.  They have deferred audiologic testing in the last year.  They do not  have excessive sun exposure. Discussed the need for sun protection: hats, long sleeves and use of sunscreen if there is significant sun exposure.   Diet: the importance of a healthy diet is discussed. They do have a healthy diet.  The benefits of regular aerobic exercise were discussed. She walks 4 times per week ,  20 minutes.   Depression screen: there are no signs or vegative symptoms of depression- irritability, change in appetite, anhedonia, sadness/tearfullness.  Cognitive assessment: the patient manages all their financial and personal affairs and is actively engaged. They could relate day,date,year and events; recalled 2/3 objects at 3 minutes; performed clock-face test  normally.  The following portions of the patient's history were reviewed and updated as appropriate: allergies, current medications, past family history, past medical history,  past surgical history, past social history  and problem list.  Visual acuity was not assessed per patient preference since she has regular follow up with her ophthalmologist. Hearing and body mass index were assessed and reviewed.   During the course of the visit the patient was educated and counseled about appropriate screening and preventive services including : fall prevention , diabetes screening, nutrition counseling, colorectal cancer screening, and recommended immunizations.    CC: The primary encounter diagnosis was Hyperlipidemia LDL goal <130. Diagnoses of Family history of colon cancer requiring screening colonoscopy, Fatty liver, Essential hypertension, Cervical cancer screening, Need for prophylactic vaccination against hepatitis A and hepatitis B, Hepatic steatosis, Visit for preventive health examination, and Hyperlipidemia with target LDL less than 100 were also pertinent to this visit.  History Jessica Clayton has a past medical history of Arthritis; Hyperlipidemia; Hypertension; Menopause, premature; Obesity (BMI 30-39.9); and PAF (paroxysmal atrial fibrillation) ([place]).   She has a past surgical history that includes Cholecystectomy (1982); Foot surgery (Right); herniated disc; and Tubal ligation.   Her family history includes Alcohol abuse in her father and mother; COPD in her father and sister; Cancer in her father and mother; Early death in her sister; Heart disease in her father and sister; Hyperlipidemia in her father; Hypertension in her father.She reports that she has never smoked. She has never used smokeless tobacco. She  reports that she does not drink alcohol or use drugs.  Outpatient Medications Prior to Visit  Medication Sig Dispense Refill  . aspirin 81 MG tablet Take 81 mg by mouth daily.    .  ergocalciferol (DRISDOL) 50000 units capsule Take 1 capsule (50,000 Units total) by mouth once a week. 4 capsule 3  . fluticasone (FLONASE) 50 MCG/ACT nasal spray Place 2 sprays into both nostrils daily. 16 g 6  . furosemide (LASIX) 20 MG tablet Take 1 tablet (20 mg total) by mouth as needed (for SOB and/or leg swelling). 90 tablet 3  . lisinopril (PRINIVIL,ZESTRIL) 10 MG tablet Take 1 tablet (10 mg total) by mouth daily. 90 tablet 0  . potassium chloride (K-DUR) 10 MEQ tablet Take 1 tablet (10 mEq total) by mouth daily. 90 tablet 3  . meloxicam (MOBIC) 7.5 MG tablet Take 1 tablet (7.5 mg total) by mouth daily. (Patient not taking: Reported on 03/04/2017) 15 tablet 0   No facility-administered medications prior to visit.     Review of Systems   Patient denies headache, fevers, malaise, unintentional weight loss, skin rash, eye pain, sinus congestion and sinus pain, sore throat, dysphagia,  hemoptysis , cough, dyspnea, wheezing, chest pain, palpitations, orthopnea, edema, abdominal pain, nausea, melena, diarrhea, constipation, flank pain, dysuria, hematuria, urinary  Frequency, nocturia, numbness, tingling, seizures,  Focal weakness, Loss of consciousness,  Tremor, insomnia, depression, anxiety, and suicidal ideation.      Objective:  BP 108/62 (BP Location: Left Arm, Patient Position: Sitting, Cuff Size: Normal)   Pulse (!) 53   Temp 98.5 F (36.9 C) (Oral)   Resp 16   Ht 5\' 4"  (1.626 m)   Wt 225 lb (102.1 kg)   SpO2 96%   BMI 38.62 kg/m   Physical Exam   General Appearance:    Alert, cooperative, no distress, appears stated age  Head:    Normocephalic, without obvious abnormality, atraumatic  Eyes:    PERRL, conjunctiva/corneas clear, EOM's intact, fundi    benign, both eyes  Ears:    Normal TM's and external ear canals, both ears  Nose:   Nares normal, septum midline, mucosa normal, no drainage    or sinus tenderness  Throat:   Lips, mucosa, and tongue normal; teeth and gums  normal  Neck:   Supple, symmetrical, trachea midline, no adenopathy;    thyroid:  no enlargement/tenderness/nodules; no carotid   bruit or JVD  Back:     Symmetric, no curvature, ROM normal, no CVA tenderness  Lungs:     Clear to auscultation bilaterally, respirations unlabored  Chest Wall:    No tenderness or deformity   Heart:    Regular rate and rhythm, S1 and S2 normal, no murmur, rub   or gallop  Breast Exam:    No tenderness, masses, or nipple abnormality  Abdomen:     Soft, non-tender, bowel sounds active all four quadrants,    no masses, no organomegaly  Genitalia:    Pelvic: cervix normal in appearance, external genitalia normal, no adnexal masses or tenderness, no cervical motion tenderness, rectovaginal septum normal, uterus normal size, shape, and consistency and vagina normal without discharge  Extremities:   Extremities normal, atraumatic, no cyanosis or edema  Pulses:   2+ and symmetric all extremities  Skin:   Skin color, texture, turgor normal, no rashes or lesions  Lymph nodes:   Cervical, supraclavicular, and axillary nodes normal  Neurologic:   CNII-XII intact, normal strength, sensation and reflexes  throughout      Assessment & Plan:   Problem List Items Addressed This Visit    Family history of colon cancer requiring screening colonoscopy    Mother had colon ca at age 5  Patient is due for 5 year folow up, discussed referral to Dr Allen Norris       Relevant Orders   Ambulatory referral to Gastroenterology   Hepatic steatosis    Presumed by ultrasound changes and serologies negative for autoimmune causes of hepatitis.  Current liver enzymes arenormalized,   and all modifiable risk factors including obesity and hyperlipidemia have been addressed with medications and/or advice to follow a low glycemic diet.  She will return for the 2nd dose of the Hepatitis A/B vaccine series       Hyperlipidemia with target LDL less than 100    LDL and triglycerides are at goal  without  medications.  No changes today.  Lab Results  Component Value Date   CHOL 162 03/04/2017   HDL 48.60 03/04/2017   LDLCALC 86 03/04/2017   TRIG 134.0 03/04/2017   CHOLHDL 3 03/04/2017          Hypertension    Well controlled on current regimen. Renal function stable, no changes today.  Lab Results  Component Value Date   CREATININE 0.86 03/04/2017   Lab Results  Component Value Date   NA 138 03/04/2017   K 3.8 03/04/2017   CL 102 03/04/2017   CO2 29 03/04/2017         Relevant Orders   Microalbumin / creatinine urine ratio (Completed)   Visit for preventive health examination    Annual comprehensive preventive exam was done as well as an evaluation and management of chronic conditions .  During the course of the visit the patient was educated and counseled about appropriate screening and preventive services including :  diabetes screening, lipid analysis with projected  10 year  risk for CAD , nutrition counseling, breast, cervical and colorectal cancer screening, and recommended immunizations.  Printed recommendations for health maintenance screenings was given.       Other Visit Diagnoses    Hyperlipidemia LDL goal <130    -  Primary   Relevant Orders   Lipid panel (Completed)   Fatty liver       Relevant Orders   Comprehensive metabolic panel (Completed)   Cervical cancer screening       Relevant Orders   Cytology - PAP (Completed)   Need for prophylactic vaccination against hepatitis A and hepatitis B       Relevant Orders   Hepatitis A hepatitis B combined vaccine IM (Completed)      I have discontinued Ms. Wadel's meloxicam. I am also having her start on Liraglutide -Weight Management and Pen Needles. Additionally, I am having her maintain her aspirin, furosemide, potassium chloride, ergocalciferol, fluticasone, and lisinopril.  Meds ordered this encounter  Medications  . Liraglutide -Weight Management (SAXENDA) 18 MG/3ML SOPN    Sig: Inject 0.6  mg into the skin daily. Increase dose weekly as follows: Week 2: 1.2 mg daily ; Week 3: 1.8 mg daily; Week 4: 2.4 mg daily    Dispense:  9 mL    Refill:  0  . Insulin Pen Needle (PEN NEEDLES) 31G X 6 MM MISC    Sig: For use with victoza /saxenda    Dispense:  100 each    Refill:  0    Medications Discontinued During This Encounter  Medication Reason  .  meloxicam (MOBIC) 7.5 MG tablet Patient has not taken in last 30 days    Follow-up: No Follow-up on file.   Crecencio Mc, MD

## 2017-03-04 NOTE — Patient Instructions (Addendum)
I recommend getting the full  Hepatitis A and B vaccines to protect your liver from these viral infections that could lead to liver failure.    Return  In one month for second shot  Referral to Dr Servando Snare is in process for colonoscopy.    I am recommending use of the medication called Saxenda to help you lose weight.  It is similar to a a medicine that is used to treat diabetes called Victoza,  So It may lower your blood sugars .   It is injected daily in incrementally increasing doses (if tolerated,  Nausea usually resolves in a few days)"  0.6 mg daily   Week 1 1.2 mg daily Week 2 1.8 mg  [place] Week 3 2.4 mg daily Week 4 3.0 mg daily Week 5 and ongoing   If you want to bring the pen with you to be shown how to give yourself the dose,  We will make you an  RN visit once you pick up your medication from your pharmacy    Health Maintenance for Postmenopausal Women Menopause is a normal process in which your reproductive ability comes to an end. This process happens gradually over a span of months to years, usually between the ages of 19 and 59. Menopause is complete when you have missed 12 consecutive menstrual periods. It is important to talk with your health care provider about some of the most common conditions that affect postmenopausal women, such as heart disease, cancer, and bone loss (osteoporosis). Adopting a healthy lifestyle and getting preventive care can help to promote your health and wellness. Those actions can also lower your chances of developing some of these common conditions. What should I know about menopause? During menopause, you may experience a number of symptoms, such as:  Moderate-to-severe hot flashes.  Night sweats.  Decrease in sex drive.  Mood swings.  Headaches.  Tiredness.  Irritability.  Memory problems.  Insomnia.  Choosing to treat or not to treat menopausal changes is an individual decision that you make with your health care  provider. What should I know about hormone replacement therapy and supplements? Hormone therapy products are effective for treating symptoms that are associated with menopause, such as hot flashes and night sweats. Hormone replacement carries certain risks, especially as you become older. If you are thinking about using estrogen or estrogen with progestin treatments, discuss the benefits and risks with your health care provider. What should I know about heart disease and stroke? Heart disease, heart attack, and stroke become more likely as you age. This may be due, in part, to the hormonal changes that your body experiences during menopause. These can affect how your body processes dietary fats, triglycerides, and cholesterol. Heart attack and stroke are both medical emergencies. There are many things that you can do to help prevent heart disease and stroke:  Have your blood pressure checked at least every 1-2 years. High blood pressure causes heart disease and increases the risk of stroke.  If you are 69-69 years old, ask your health care provider if you should take aspirin to prevent a heart attack or a stroke.  Do not use any tobacco products, including cigarettes, chewing tobacco, or electronic cigarettes. If you need help quitting, ask your health care provider.  It is important to eat a healthy diet and maintain a healthy weight. ? Be sure to include plenty of vegetables, fruits, low-fat dairy products, and lean protein. ? Avoid eating foods that are high in solid fats,  added sugars, or salt (sodium).  Get regular exercise. This is one of the most important things that you can do for your health. ? Try to exercise for at least 150 minutes each week. The type of exercise that you do should increase your heart rate and make you sweat. This is known as moderate-intensity exercise. ? Try to do strengthening exercises at least twice each week. Do these in addition to the moderate-intensity  exercise.  Know your numbers.Ask your health care provider to check your cholesterol and your blood glucose. Continue to have your blood tested as directed by your health care provider.  What should I know about cancer screening? There are several types of cancer. Take the following steps to reduce your risk and to catch any cancer development as early as possible. Breast Cancer  Practice breast self-awareness. ? This means understanding how your breasts normally appear and feel. ? It also means doing regular breast self-exams. Let your health care provider know about any changes, no matter how small.  If you are 40 or older, have a clinician do a breast exam (clinical breast exam or CBE) every year. Depending on your age, family history, and medical history, it may be recommended that you also have a yearly breast X-ray (mammogram).  If you have a family history of breast cancer, talk with your health care provider about genetic screening.  If you are at high risk for breast cancer, talk with your health care provider about having an MRI and a mammogram every year.  Breast cancer (BRCA) gene test is recommended for women who have family members with BRCA-related cancers. Results of the assessment will determine the need for genetic counseling and BRCA1 and for BRCA2 testing. BRCA-related cancers include these types: ? Breast. This occurs in males or females. ? Ovarian. ? Tubal. This may also be called fallopian tube cancer. ? Cancer of the abdominal or pelvic lining (peritoneal cancer). ? Prostate. ? Pancreatic.  Cervical, Uterine, and Ovarian Cancer Your health care provider may recommend that you be screened regularly for cancer of the pelvic organs. These include your ovaries, uterus, and vagina. This screening involves a pelvic exam, which includes checking for microscopic changes to the surface of your cervix (Pap test).  For women ages 21-65, health care providers may recommend a  pelvic exam and a Pap test every three years. For women ages 54-65, they may recommend the Pap test and pelvic exam, combined with testing for human papilloma virus (HPV), every five years. Some types of HPV increase your risk of cervical cancer. Testing for HPV may also be done on women of any age who have unclear Pap test results.  Other health care providers may not recommend any screening for nonpregnant women who are considered low risk for pelvic cancer and have no symptoms. Ask your health care provider if a screening pelvic exam is right for you.  If you have had past treatment for cervical cancer or a condition that could lead to cancer, you need Pap tests and screening for cancer for at least 20 years after your treatment. If Pap tests have been discontinued for you, your risk factors (such as having a new sexual partner) need to be reassessed to determine if you should start having screenings again. Some women have medical problems that increase the chance of getting cervical cancer. In these cases, your health care provider may recommend that you have screening and Pap tests more often.  If you have a family  history of uterine cancer or ovarian cancer, talk with your health care provider about genetic screening.  If you have vaginal bleeding after reaching menopause, tell your health care provider.  There are currently no reliable tests available to screen for ovarian cancer.  Lung Cancer Lung cancer screening is recommended for adults 82-38 years old who are at high risk for lung cancer because of a history of smoking. A yearly low-dose CT scan of the lungs is recommended if you:  Currently smoke.  Have a history of at least 30 pack-years of smoking and you currently smoke or have quit within the past 15 years. A pack-year is smoking an average of one pack of cigarettes per day for one year.  Yearly screening should:  Continue until it has been 15 years since you quit.  Stop if  you develop a health problem that would prevent you from having lung cancer treatment.  Colorectal Cancer  This type of cancer can be detected and can often be prevented.  Routine colorectal cancer screening usually begins at age 75 and continues through age 62.  If you have risk factors for colon cancer, your health care provider may recommend that you be screened at an earlier age.  If you have a family history of colorectal cancer, talk with your health care provider about genetic screening.  Your health care provider may also recommend using home test kits to check for hidden blood in your stool.  A small camera at the end of a tube can be used to examine your colon directly (sigmoidoscopy or colonoscopy). This is done to check for the earliest forms of colorectal cancer.  Direct examination of the colon should be repeated every 5-10 years until age 57. However, if early forms of precancerous polyps or small growths are found or if you have a family history or genetic risk for colorectal cancer, you may need to be screened more often.  Skin Cancer  Check your skin from head to toe regularly.  Monitor any moles. Be sure to tell your health care provider: ? About any new moles or changes in moles, especially if there is a change in a mole's shape or color. ? If you have a mole that is larger than the size of a pencil eraser.  If any of your family members has a history of skin cancer, especially at a young age, talk with your health care provider about genetic screening.  Always use sunscreen. Apply sunscreen liberally and repeatedly throughout the day.  Whenever you are outside, protect yourself by wearing long sleeves, pants, a wide-brimmed hat, and sunglasses.  What should I know about osteoporosis? Osteoporosis is a condition in which bone destruction happens more quickly than new bone creation. After menopause, you may be at an increased risk for osteoporosis. To help prevent  osteoporosis or the bone fractures that can happen because of osteoporosis, the following is recommended:  If you are 41-30 years old, get at least 1,000 mg of calcium and at least 600 mg of vitamin D per day.  If you are older than age 33 but younger than age 90, get at least 1,200 mg of calcium and at least 600 mg of vitamin D per day.  If you are older than age 80, get at least 1,200 mg of calcium and at least 800 mg of vitamin D per day.  Smoking and excessive alcohol intake increase the risk of osteoporosis. Eat foods that are rich in calcium and vitamin D,  and do weight-bearing exercises several times each week as directed by your health care provider. What should I know about how menopause affects my mental health? Depression may occur at any age, but it is more common as you become older. Common symptoms of depression include:  Low or sad mood.  Changes in sleep patterns.  Changes in appetite or eating patterns.  Feeling an overall lack of motivation or enjoyment of activities that you previously enjoyed.  Frequent crying spells.  Talk with your health care provider if you think that you are experiencing depression. What should I know about immunizations? It is important that you get and maintain your immunizations. These include:  Tetanus, diphtheria, and pertussis (Tdap) booster vaccine.  Influenza every year before the flu season begins.  Pneumonia vaccine.  Shingles vaccine.  Your health care provider may also recommend other immunizations. This information is not intended to replace advice given to you by your health care provider. Make sure you discuss any questions you have with your health care provider. Document Released: 09/26/2005 Document Revised: 02/22/2016 Document Reviewed: 05/08/2015 Elsevier Interactive Patient Education  2018 [employer].

## 2017-03-05 ENCOUNTER — Telehealth: Payer: Self-pay

## 2017-03-05 LAB — LIPID PANEL
CHOL/HDL RATIO: 3
Cholesterol: 162 mg/dL (ref 0–200)
HDL: 48.6 mg/dL (ref >39.00)
LDL Cholesterol: 86 mg/dL (ref 0–99)
NonHDL: 113.17
TRIGLYCERIDES: 134 mg/dL (ref 0.0–149.0)
VLDL: 26.8 mg/dL (ref 0.0–40.0)

## 2017-03-05 LAB — COMPREHENSIVE METABOLIC PANEL
ALT: 35 U/L (ref 0–35)
AST: 30 U/L (ref 0–37)
Albumin: 4.2 g/dL (ref 3.5–5.2)
Alkaline Phosphatase: 69 U/L (ref 39–117)
BILIRUBIN TOTAL: 0.5 mg/dL (ref 0.2–1.2)
BUN: 14 mg/dL (ref 6–23)
CALCIUM: 9.4 mg/dL (ref 8.4–10.5)
CO2: 29 meq/L (ref 19–32)
CREATININE: 0.86 mg/dL (ref 0.40–1.20)
Chloride: 102 mEq/L (ref 96–112)
GFR: 72.38 mL/min (ref >60.00)
Glucose, Bld: 91 mg/dL (ref 70–99)
Potassium: 3.8 mEq/L (ref 3.5–5.1)
Sodium: 138 mEq/L (ref 135–145)
Total Protein: 6.9 g/dL (ref 6.0–8.3)

## 2017-03-05 LAB — MICROALBUMIN / CREATININE URINE RATIO
CREATININE, U: 141.8 mg/dL
MICROALB/CREAT RATIO: 0.5 mg/g (ref 0.0–30.0)
Microalb, Ur: <0.7 mg/dL (ref 0.0–1.9)

## 2017-03-05 NOTE — Telephone Encounter (Signed)
PA for Saxenda completed on cover my meds

## 2017-03-06 LAB — CYTOLOGY - PAP
Diagnosis: NEGATIVE
HPV: NOT DETECTED

## 2017-03-07 ENCOUNTER — Encounter: Payer: Self-pay | Admitting: Internal Medicine

## 2017-03-07 NOTE — Assessment & Plan Note (Signed)
Well controlled on current regimen. Renal function stable, no changes today.  Lab Results  Component Value Date   CREATININE 0.86 03/04/2017   Lab Results  Component Value Date   NA 138 03/04/2017   K 3.8 03/04/2017   CL 102 03/04/2017   CO2 29 03/04/2017

## 2017-03-07 NOTE — Assessment & Plan Note (Signed)
LDL and triglycerides are at goal without  medications.  No changes today.  Lab Results  Component Value Date   CHOL 162 03/04/2017   HDL 48.60 03/04/2017   LDLCALC 86 03/04/2017   TRIG 134.0 03/04/2017   CHOLHDL 3 03/04/2017

## 2017-03-07 NOTE — Assessment & Plan Note (Signed)
Annual comprehensive preventive exam was done as well as an evaluation and management of chronic conditions .  During the course of the visit the patient was educated and counseled about appropriate screening and preventive services including :  diabetes screening, lipid analysis with projected  10 year  risk for CAD , nutrition counseling, breast, cervical and colorectal cancer screening, and recommended immunizations.  Printed recommendations for health maintenance screenings was given 

## 2017-03-07 NOTE — Assessment & Plan Note (Signed)
Presumed by ultrasound changes and serologies negative for autoimmune causes of hepatitis.  Current liver enzymes arenormalized,   and all modifiable risk factors including obesity and hyperlipidemia have been addressed with medications and/or advice to follow a low glycemic diet.  She will return for the 2nd dose of the Hepatitis A/B vaccine series

## 2017-03-08 ENCOUNTER — Encounter: Payer: Self-pay | Admitting: Internal Medicine

## 2017-03-10 NOTE — Telephone Encounter (Signed)
  PA was denied as the treatment of obesity guidelines indicate that a trial fo Belviq, Bleviq XR, Qsymia, xenical, phetermine, phendimetrazine , benzphetamine, or diethylpropion must have been tried.    Please advise, thanks

## 2017-03-16 ENCOUNTER — Other Ambulatory Visit: Payer: Self-pay

## 2017-03-16 ENCOUNTER — Telehealth: Payer: Self-pay

## 2017-03-16 DIAGNOSIS — Z800 Family history of malignant neoplasm of digestive organs: Secondary | ICD-10-CM

## 2017-03-16 NOTE — Telephone Encounter (Signed)
Gastroenterology Pre-Procedure Review  Request Date: 04/06/17 Requesting Physician: Dr. Allen Norris  PATIENT REVIEW QUESTIONS: The patient responded to the following health history questions as indicated:    1. Are you having any GI issues? no 2. Do you have a personal history of Polyps? no 3. Do you have a family history of Colon Cancer or Polyps? yes (Mom colon cancer) 4. Diabetes Mellitus? no 5. Joint replacements in the past 12 months?no 6. Major health problems in the past 3 months?no 7. Any artificial heart valves, MVP, or defibrillator?no    MEDICATIONS & ALLERGIES:    Patient reports the following regarding taking any anticoagulation/antiplatelet therapy:   Plavix, Coumadin, Eliquis, Xarelto, Lovenox, Pradaxa, Brilinta, or Effient? no Aspirin? yes (81 mg daily)  Patient confirms/reports the following medications:  Current Outpatient Prescriptions  Medication Sig Dispense Refill  . aspirin 81 MG tablet Take 81 mg by mouth daily.    . ergocalciferol (DRISDOL) 50000 units capsule Take 1 capsule (50,000 Units total) by mouth once a week. 4 capsule 3  . fluticasone (FLONASE) 50 MCG/ACT nasal spray Place 2 sprays into both nostrils daily. 16 g 6  . furosemide (LASIX) 20 MG tablet Take 1 tablet (20 mg total) by mouth as needed (for SOB and/or leg swelling). 90 tablet 3  . Insulin Pen Needle (PEN NEEDLES) 31G X 6 MM MISC For use with victoza /saxenda 100 each 0  . Liraglutide -Weight Management (SAXENDA) 18 MG/3ML SOPN Inject 0.6 mg into the skin daily. Increase dose weekly as follows: Week 2: 1.2 mg daily ; Week 3: 1.8 mg daily; Week 4: 2.4 mg daily 9 mL 0  . potassium chloride (K-DUR) 10 MEQ tablet Take 1 tablet (10 mEq total) by mouth daily. 90 tablet 3   No current facility-administered medications for this visit.     Patient confirms/reports the following allergies:  No Known Allergies  No orders of the defined types were placed in this encounter.   AUTHORIZATION  INFORMATION Primary Insurance: 1D#: Group #:  Secondary Insurance: 1D#: Group #:  SCHEDULE INFORMATION: Date: 04/06/17 Time: Location:[facility]

## 2017-03-19 ENCOUNTER — Encounter: Payer: Self-pay | Admitting: Internal Medicine

## 2017-03-19 ENCOUNTER — Other Ambulatory Visit: Payer: Self-pay | Admitting: Internal Medicine

## 2017-03-19 MED ORDER — LISINOPRIL 10 MG PO TABS: 10.0000 mg | ORAL_TABLET | Freq: Every day | ORAL | 0 refills | Status: DC

## 2017-03-27 ENCOUNTER — Other Ambulatory Visit: Payer: Self-pay | Admitting: Internal Medicine

## 2017-03-27 MED ORDER — NALTREXONE-BUPROPION HCL ER 8-90 MG PO TB12: ORAL_TABLET | ORAL | 0 refills | Status: DC

## 2017-03-27 NOTE — Telephone Encounter (Signed)
PA for Saxenda was denied, needs to have trialed the other meds in the denial note.

## 2017-03-30 ENCOUNTER — Telehealth: Payer: Self-pay

## 2017-03-30 ENCOUNTER — Encounter: Payer: Self-pay | Admitting: *Deleted

## 2017-03-30 NOTE — Telephone Encounter (Signed)
PA for Contrave completed on Cover my meds thanks

## 2017-04-03 NOTE — Discharge Instructions (Signed)
General Anesthesia, Adult, Care After °These instructions provide you with information about caring for yourself after your procedure. Your health care provider may also give you more specific instructions. Your treatment has been planned according to current medical practices, but problems sometimes occur. Call your health care provider if you have any problems or questions after your procedure. °What can I expect after the procedure? °After the procedure, it is common to have: °· Vomiting. °· A sore throat. °· Mental slowness. ° °It is common to feel: °· Nauseous. °· Cold or shivery. °· Sleepy. °· Tired. °· Sore or achy, even in parts of your body where you did not have surgery. ° °Follow these instructions at home: °For at least 24 hours after the procedure: °· Do not: °? Participate in activities where you could fall or become injured. °? Drive. °? Use heavy machinery. °? Drink alcohol. °? Take sleeping pills or medicines that cause drowsiness. °? Make important decisions or sign legal documents. °? Take care of children on your own. °· Rest. °Eating and drinking °· If you vomit, drink water, juice, or soup when you can drink without vomiting. °· Drink enough fluid to keep your urine clear or pale yellow. °· Make sure you have little or no nausea before eating solid foods. °· Follow the diet recommended by your health care provider. °General instructions °· Have a responsible adult stay with you until you are awake and alert. °· Return to your normal activities as told by your health care provider. Ask your health care provider what activities are safe for you. °· Take over-the-counter and prescription medicines only as told by your health care provider. °· If you smoke, do not smoke without supervision. °· Keep all follow-up visits as told by your health care provider. This is important. °Contact a health care provider if: °· You continue to have nausea or vomiting at home, and medicines are not helpful. °· You  cannot drink fluids or start eating again. °· You cannot urinate after 8-12 hours. °· You develop a skin rash. °· You have fever. °· You have increasing redness at the site of your procedure. °Get help right away if: °· You have difficulty breathing. °· You have chest pain. °· You have unexpected bleeding. °· You feel that you are having a life-threatening or urgent problem. °This information is not intended to replace advice given to you by your health care provider. Make sure you discuss any questions you have with your health care provider. °Document Released: 11/10/2000 Document Revised: 01/07/2016 Document Reviewed: 07/19/2015 °Elsevier Interactive Patient Education © 2018 [employer] ° °

## 2017-04-06 ENCOUNTER — Encounter
Admission: RE | Disposition: A | Discharge status: Home / Self Care | Payer: Self-pay | Source: Ambulatory Visit | Attending: Gastroenterology

## 2017-04-06 ENCOUNTER — Ambulatory Visit: Payer: 59 | Admitting: Anesthesiology

## 2017-04-06 ENCOUNTER — Ambulatory Visit
Admission: RE | Admit: 2017-04-06 | Discharge: 2017-04-06 | Disposition: A | Discharge status: Home / Self Care | Payer: 59 | Source: Ambulatory Visit | Attending: Gastroenterology | Admitting: Gastroenterology

## 2017-04-06 DIAGNOSIS — Z7982 Long term (current) use of aspirin: Secondary | ICD-10-CM | POA: Diagnosis not present

## 2017-04-06 DIAGNOSIS — K648 Other hemorrhoids: Secondary | ICD-10-CM | POA: Insufficient documentation

## 2017-04-06 DIAGNOSIS — Z9989 Dependence on other enabling machines and devices: Secondary | ICD-10-CM | POA: Insufficient documentation

## 2017-04-06 DIAGNOSIS — E785 Hyperlipidemia, unspecified: Secondary | ICD-10-CM | POA: Insufficient documentation

## 2017-04-06 DIAGNOSIS — Z800 Family history of malignant neoplasm of digestive organs: Secondary | ICD-10-CM | POA: Diagnosis not present

## 2017-04-06 DIAGNOSIS — G4730 Sleep apnea, unspecified: Secondary | ICD-10-CM | POA: Diagnosis not present

## 2017-04-06 DIAGNOSIS — I10 Essential (primary) hypertension: Secondary | ICD-10-CM | POA: Insufficient documentation

## 2017-04-06 DIAGNOSIS — Z6830 Body mass index (BMI) 30.0-30.9, adult: Secondary | ICD-10-CM | POA: Insufficient documentation

## 2017-04-06 DIAGNOSIS — E669 Obesity, unspecified: Secondary | ICD-10-CM | POA: Insufficient documentation

## 2017-04-06 DIAGNOSIS — I480 Paroxysmal atrial fibrillation: Secondary | ICD-10-CM | POA: Insufficient documentation

## 2017-04-06 DIAGNOSIS — Z79899 Other long term (current) drug therapy: Secondary | ICD-10-CM | POA: Diagnosis not present

## 2017-04-06 DIAGNOSIS — M1990 Unspecified osteoarthritis, unspecified site: Secondary | ICD-10-CM | POA: Diagnosis not present

## 2017-04-06 DIAGNOSIS — Z1211 Encounter for screening for malignant neoplasm of colon: Secondary | ICD-10-CM | POA: Insufficient documentation

## 2017-04-06 DIAGNOSIS — Z961 Presence of intraocular lens: Secondary | ICD-10-CM | POA: Insufficient documentation

## 2017-04-06 HISTORY — DX: Adverse effect of unspecified anesthetic, initial encounter: T41.45XA

## 2017-04-06 HISTORY — PX: COLONOSCOPY WITH PROPOFOL: SHX5780

## 2017-04-06 HISTORY — DX: Sleep apnea, unspecified: G47.30

## 2017-04-06 HISTORY — DX: Other complications of anesthesia, initial encounter: T88.59XA

## 2017-04-06 SURGERY — COLONOSCOPY WITH PROPOFOL
Anesthesia: General

## 2017-04-06 MED ORDER — ONDANSETRON HCL 4 MG/2ML IJ SOLN: 4.0000 mg | Freq: Once | INTRAMUSCULAR | Status: DC | PRN

## 2017-04-06 MED ORDER — STERILE WATER FOR IRRIGATION IR SOLN
Status: DC | PRN
  Administered 2017-04-06: 09:00:00

## 2017-04-06 MED ORDER — PROPOFOL 10 MG/ML IV BOLUS
INTRAVENOUS | Status: DC | PRN
  Administered 2017-04-06: 50 mg via INTRAVENOUS
  Administered 2017-04-06: 20 mg via INTRAVENOUS
  Administered 2017-04-06: 30 mg via INTRAVENOUS
  Administered 2017-04-06: 100 mg via INTRAVENOUS
  Administered 2017-04-06: 30 mg via INTRAVENOUS
  Administered 2017-04-06: 20 mg via INTRAVENOUS

## 2017-04-06 MED ORDER — LACTATED RINGERS IV SOLN
INTRAVENOUS | Status: DC
  Administered 2017-04-06: 08:00:00 via INTRAVENOUS

## 2017-04-06 MED ORDER — LIDOCAINE HCL (CARDIAC) 20 MG/ML IV SOLN
INTRAVENOUS | Status: DC | PRN
  Administered 2017-04-06: 50 mg via INTRAVENOUS

## 2017-04-06 SURGICAL SUPPLY — 23 items
CANISTER SUCT 1200ML W/VALVE (MISCELLANEOUS) ×2 IMPLANT
CLIP HMST 235XBRD CATH ROT (MISCELLANEOUS) IMPLANT
CLIP RESOLUTION 360 11X235 (MISCELLANEOUS)
FCP ESCP3.2XJMB 240X2.8X (MISCELLANEOUS)
FORCEPS BIOP RAD 4 LRG CAP 4 (CUTTING FORCEPS) IMPLANT
FORCEPS BIOP RJ4 240 W/NDL (MISCELLANEOUS)
FORCEPS ESCP3.2XJMB 240X2.8X (MISCELLANEOUS) IMPLANT
GOWN CVR UNV OPN BCK APRN NK (MISCELLANEOUS) ×2 IMPLANT
GOWN ISOL THUMB LOOP REG UNIV (MISCELLANEOUS) ×4
INJECTOR VARIJECT VIN23 (MISCELLANEOUS) IMPLANT
KIT DEFENDO VALVE AND CONN (KITS) IMPLANT
KIT ENDO PROCEDURE OLY (KITS) ×2 IMPLANT
MARKER SPOT ENDO TATTOO 5ML (MISCELLANEOUS) IMPLANT
PAD GROUND ADULT SPLIT (MISCELLANEOUS) IMPLANT
PROBE APC STR FIRE (PROBE) IMPLANT
RETRIEVER NET ROTH 2.5X230 LF (MISCELLANEOUS) IMPLANT
SNARE SHORT THROW 13M SML OVAL (MISCELLANEOUS) IMPLANT
SNARE SHORT THROW 30M LRG OVAL (MISCELLANEOUS) IMPLANT
SNARE SNG USE RND 15MM (INSTRUMENTS) IMPLANT
SPOT EX ENDOSCOPIC TATTOO (MISCELLANEOUS)
TRAP ETRAP POLY (MISCELLANEOUS) IMPLANT
VARIJECT INJECTOR VIN23 (MISCELLANEOUS)
WATER STERILE IRR 250ML POUR (IV SOLUTION) ×2 IMPLANT

## 2017-04-06 NOTE — Transfer of Care (Signed)
Immediate Anesthesia Transfer of Care Note  Patient: Jessica Clayton  Procedure(s) Performed: Procedure(s) with comments: COLONOSCOPY WITH PROPOFOL (N/A) - sleep apnea  Patient Location: PACU  Anesthesia Type: General  Level of Consciousness: awake, alert  and patient cooperative  Airway and Oxygen Therapy: Patient Spontanous Breathing and Patient connected to supplemental oxygen  Post-op Assessment: Post-op Vital signs reviewed, Patient's Cardiovascular Status Stable, Respiratory Function Stable, Patent Airway and No signs of Nausea or vomiting  Post-op Vital Signs: Reviewed and stable  Complications: No apparent anesthesia complications

## 2017-04-06 NOTE — Anesthesia Preprocedure Evaluation (Signed)
Anesthesia Evaluation  Patient identified by MRN, date of birth, ID band Patient awake    Reviewed: Allergy & Precautions, NPO status , Patient's Chart, lab work & pertinent test results  History of Anesthesia Complications Negative for: history of anesthetic complications  Airway Mallampati: IV  TM Distance: >3 FB Neck ROM: Full    Dental  (+)    Pulmonary sleep apnea and Continuous Positive Airway Pressure Ventilation ,    Pulmonary exam normal breath sounds clear to auscultation       Cardiovascular Exercise Tolerance: Good hypertension, Normal cardiovascular exam Rhythm:Regular Rate:Normal  Hx a fib 2/2 abnormal potassium level   Neuro/Psych  Headaches,    GI/Hepatic negative GI ROS, Fatty liver   Endo/Other  Obesity   Renal/GU negative Renal ROS     Musculoskeletal  (+) Arthritis , Osteoarthritis,    Abdominal   Peds  Hematology negative hematology ROS (+)   Anesthesia Other Findings   Reproductive/Obstetrics                             Anesthesia Physical Anesthesia Plan  ASA: II  Anesthesia Plan: General   Post-op Pain Management:    Induction: Intravenous  PONV Risk Score and Plan: 2 and Ondansetron and Propofol infusion  Airway Management Planned: Natural Airway  Additional Equipment:   Intra-op Plan:   Post-operative Plan:   Informed Consent: I have reviewed the patients History and Physical, chart, labs and discussed the procedure including the risks, benefits and alternatives for the proposed anesthesia with the patient or authorized representative who has indicated his/her understanding and acceptance.     Plan Discussed with: CRNA  Anesthesia Plan Comments:         Anesthesia Quick Evaluation

## 2017-04-06 NOTE — Anesthesia Procedure Notes (Signed)
Procedure Name: MAC Date/Time: 04/06/2017 9:05 AM Performed by: Janna Arch Pre-anesthesia Checklist: Patient identified, Emergency Drugs available, Suction available and Patient being monitored Patient Re-evaluated:Patient Re-evaluated prior to induction Oxygen Delivery Method: Nasal cannula

## 2017-04-06 NOTE — H&P (Signed)
Lucilla Lame, MD [address] [place], [place] [address] Phone:[phone] Fax : [phone]  Primary Care Physician:  Crecencio Mc, MD Primary Gastroenterologist:  Dr. Allen Norris  Pre-Procedure History & Physical: HPI:  Jessica Clayton is a 56 y.o. female is here for an colonoscopy.   Past Medical History:  Diagnosis Date  . Arthritis    left shoulder, injected by Margaretmary Eddy 2012  . Complication of anesthesia    Sats dropped during cataract surgery  . Hyperlipidemia    hypertriglyceridemia  . Hypertension   . Menopause, premature    at age 89  . Obesity (BMI 30-39.9)   . PAF (paroxysmal atrial fibrillation) (HCC)    R/T low potassium  . Sleep apnea    CPAP    Past Surgical History:  Procedure Laterality Date  . CATARACT EXTRACTION W/ INTRAOCULAR LENS  IMPLANT, BILATERAL    . CHOLECYSTECTOMY  1982  . FOOT SURGERY Right    tumor  . herniated disc    . TUBAL LIGATION      Prior to Admission medications   Medication Sig Start Date End Date Taking? Authorizing Provider  aspirin 81 MG tablet Take 81 mg by mouth daily.   Yes [provider]  DANDELION PO Take by mouth daily.   Yes [provider]  ergocalciferol (DRISDOL) 50000 units capsule Take 1 capsule (50,000 Units total) by mouth once a week. 07/13/16  Yes Crecencio Mc, MD  fluticasone (FLONASE) 50 MCG/ACT nasal spray Place 2 sprays into both nostrils daily. 07/28/16  Yes Fisher, Linden Dolin, PA-C  furosemide (LASIX) 20 MG tablet Take 1 tablet (20 mg total) by mouth as needed (for SOB and/or leg swelling). 07/09/15  Yes Minna Merritts, MD  lisinopril (PRINIVIL,ZESTRIL) 10 MG tablet Take 1 tablet (10 mg total) by mouth daily. 03/19/17  Yes Crecencio Mc, MD  MILK THISTLE PO Take by mouth daily.   Yes [provider]  potassium chloride (K-DUR) 10 MEQ tablet Take 1 tablet (10 mEq total) by mouth daily. 07/09/15  Yes Minna Merritts, MD  TURMERIC PO Take by mouth  daily.   Yes [provider]    Allergies as of 03/16/2017  . (No Known Allergies)    Family History  Problem Relation Age of Onset  . Cancer Mother   . Alcohol abuse Mother   . Cancer Father   . COPD Father   . Alcohol abuse Father   . Heart disease Father   . Hypertension Father   . Hyperlipidemia Father   . Early death Sister   . COPD Sister   . Heart disease Sister   . Breast cancer Neg Hx     Social History   Social History  . Marital status: Divorced    Spouse name: N/A  . Number of children: N/A  . Years of education: N/A   Occupational History  . Not on file.   Social History Main Topics  . Smoking status: Never Smoker  . Smokeless tobacco: Never Used  . Alcohol use No  . Drug use: No  . Sexual activity: Not on file   Other Topics Concern  . Not on file   Social History Narrative  . No narrative on file    Review of Systems: See HPI, otherwise negative ROS  Physical Exam: BP 107/62   Pulse (!) 56   Temp (!) 97 F (36.1 C) (Temporal)   Resp 16   Ht 5'  4" (1.626 m)   Wt 221 lb (100.2 kg)   SpO2 99%   BMI 37.93 kg/m  General:   Alert,  pleasant and cooperative in NAD Head:  Normocephalic and atraumatic. Neck:  Supple; no masses or thyromegaly. Lungs:  Clear throughout to auscultation.    Heart:  Regular rate and rhythm. Abdomen:  Soft, nontender and nondistended. Normal bowel sounds, without guarding, and without rebound.   Neurologic:  Alert and  oriented x4;  grossly normal neurologically.  Impression/Plan: Jessica Clayton is here for an colonoscopy to be performed for family istory of colon cancer  Risks, benefits, limitations, and alternatives regarding  colonoscopy have been reviewed with the patient.  Questions have been answered.  All parties agreeable.   Lucilla Lame, MD  04/06/2017, 8:23 AM

## 2017-04-06 NOTE — Anesthesia Postprocedure Evaluation (Signed)
Anesthesia Post Note  Patient: Jessica Clayton  Procedure(s) Performed: Procedure(s) (LRB): COLONOSCOPY WITH PROPOFOL (N/A)  Patient location during evaluation: PACU Anesthesia Type: General Level of consciousness: awake and alert, oriented and patient cooperative Pain management: pain level controlled Vital Signs Assessment: post-procedure vital signs reviewed and stable Respiratory status: spontaneous breathing, nonlabored ventilation and respiratory function stable Cardiovascular status: blood pressure returned to baseline and stable Postop Assessment: adequate PO intake Anesthetic complications: no    Darrin Nipper

## 2017-04-06 NOTE — Op Note (Signed)
[facility] Gastroenterology Patient Name: Jessica Clayton Procedure Date: 04/06/2017 9:01 AM MRN: [id number] Account #: [id number] Date of Birth: Aug 20, 1960 Admit Type: Outpatient Age: 56 Room: [facility] OR ROOM 01 Gender: Female Note Status: Finalized Procedure:            Colonoscopy Indications:          Family history of anal canal cancer in a first-degree                        relative Providers:            Lucilla Lame MD, MD Referring MD:         Deborra Medina, MD (Referring MD) Medicines:            Propofol per Anesthesia Complications:        No immediate complications. Procedure:            Pre-Anesthesia Assessment:                       - Prior to the procedure, a History and Physical was                        performed, and patient medications and allergies were                        reviewed. The patient's tolerance of previous                        anesthesia was also reviewed. The risks and benefits of                        the procedure and the sedation options and risks were                        discussed with the patient. All questions were                        answered, and informed consent was obtained. Prior                        Anticoagulants: The patient has taken no previous                        anticoagulant or antiplatelet agents. ASA Grade                        Assessment: II - A patient with mild systemic disease.                        After reviewing the risks and benefits, the patient was                        deemed in satisfactory condition to undergo the                        procedure.                       After obtaining informed consent, the colonoscope was  passed under direct vision. Throughout the procedure,                        the patient's blood pressure, pulse, and oxygen                        saturations were monitored continuously. The [place]  [id number]) was introduced through the                        anus and advanced to the the cecum, identified by                        appendiceal orifice and ileocecal valve. The                        colonoscopy was performed without difficulty. The                        patient tolerated the procedure well. The quality of                        the bowel preparation was adequate. Findings:      The perianal and digital rectal examinations were normal.      Non-bleeding internal hemorrhoids were found during retroflexion. The       hemorrhoids were Grade I (internal hemorrhoids that do not prolapse). Impression:           - Non-bleeding internal hemorrhoids.                       - No specimens collected. Recommendation:       - Discharge patient to home.                       - Resume previous diet.                       - Continue present medications.                       - Repeat colonoscopy in 5 years for surveillance. Procedure Code(s):    --- Professional ---                       [phone], Colonoscopy, flexible; diagnostic, including                        collection of specimen(s) by brushing or washing, when                        performed (separate procedure) Diagnosis Code(s):    --- Professional ---                       Z80.0, Family history of malignant neoplasm of                        digestive organs CPT copyright 2016 American Medical Association. All rights reserved. The codes documented in this report are preliminary and upon coder review may  be revised to meet current compliance requirements. Lucilla Lame MD, MD 04/06/2017 9:23:39 AM This report has been signed electronically. Number of Addenda: 0 Note Initiated On: 04/06/2017 9:01 AM Scope Withdrawal Time: 0 hours 6 minutes 26 seconds  Total Procedure Duration: 0 hours 11 minutes 21 seconds       [facility]

## 2017-04-07 ENCOUNTER — Encounter: Payer: Self-pay | Admitting: Gastroenterology

## 2017-04-07 ENCOUNTER — Ambulatory Visit (INDEPENDENT_AMBULATORY_CARE_PROVIDER_SITE_OTHER): Payer: 59

## 2017-04-07 DIAGNOSIS — Z23 Encounter for immunization: Secondary | ICD-10-CM

## 2017-04-07 NOTE — Progress Notes (Signed)
Patient came in for HepA/B, received in left deltoid.  Tolerated well.

## 2017-04-13 ENCOUNTER — Encounter: Payer: Self-pay | Admitting: *Deleted

## 2017-04-13 ENCOUNTER — Telehealth: Payer: Self-pay | Admitting: Internal Medicine

## 2017-04-13 NOTE — Telephone Encounter (Addendum)
PA for Contrave has been approved from 04/06/2017 - 08/05/2017.  PA reference number: [id number]

## 2017-04-16 NOTE — Telephone Encounter (Signed)
error 

## 2017-04-27 ENCOUNTER — Encounter: Payer: Self-pay | Admitting: Internal Medicine

## 2017-04-28 ENCOUNTER — Telehealth: Payer: Self-pay | Admitting: Internal Medicine

## 2017-04-28 MED ORDER — LIRAGLUTIDE -WEIGHT MANAGEMENT 18 MG/3ML ~~LOC~~ SOPN: 0.6000 mg | PEN_INJECTOR | Freq: Every day | SUBCUTANEOUS | 0 refills | Status: DC

## 2017-04-28 NOTE — Telephone Encounter (Signed)
Please resend the saxenda now approved,  And update vaccines with flu vaccine received at [facility],  And schedule rn visit for saxenda instruction

## 2017-04-28 NOTE — Telephone Encounter (Signed)
refilled Saxsenda added flu shot to chart. Patient will call to schedule nurse visit.

## 2017-05-13 NOTE — Telephone Encounter (Signed)
Error

## 2017-06-27 ENCOUNTER — Other Ambulatory Visit: Payer: Self-pay | Admitting: Internal Medicine

## 2017-06-27 ENCOUNTER — Encounter: Payer: Self-pay | Admitting: Internal Medicine

## 2017-07-01 MED ORDER — LIRAGLUTIDE -WEIGHT MANAGEMENT 18 MG/3ML ~~LOC~~ SOPN: 0.6000 mg | PEN_INJECTOR | Freq: Every day | SUBCUTANEOUS | 0 refills | Status: DC

## 2017-07-13 ENCOUNTER — Other Ambulatory Visit: Payer: Self-pay | Admitting: Internal Medicine

## 2017-07-28 DIAGNOSIS — L237 Allergic contact dermatitis due to plants, except food: Secondary | ICD-10-CM | POA: Diagnosis not present

## 2017-09-08 ENCOUNTER — Ambulatory Visit (INDEPENDENT_AMBULATORY_CARE_PROVIDER_SITE_OTHER): Payer: 59 | Admitting: *Deleted

## 2017-09-08 DIAGNOSIS — Z09 Encounter for follow-up examination after completed treatment for conditions other than malignant neoplasm: Secondary | ICD-10-CM

## 2017-09-08 DIAGNOSIS — Z23 Encounter for immunization: Secondary | ICD-10-CM

## 2017-11-23 ENCOUNTER — Other Ambulatory Visit: Payer: Self-pay | Admitting: Internal Medicine

## 2017-11-23 MED ORDER — LISINOPRIL 10 MG PO TABS: 10.0000 mg | ORAL_TABLET | Freq: Every day | ORAL | 0 refills | Status: DC

## 2017-11-23 MED ORDER — LIRAGLUTIDE -WEIGHT MANAGEMENT 18 MG/3ML ~~LOC~~ SOPN: 0.6000 mg | PEN_INJECTOR | Freq: Every day | SUBCUTANEOUS | 0 refills | Status: DC

## 2017-11-24 ENCOUNTER — Telehealth: Payer: Self-pay

## 2017-11-24 NOTE — Telephone Encounter (Signed)
PA for Saxenda has been submitted on covermymeds.  

## 2017-12-15 DIAGNOSIS — G4733 Obstructive sleep apnea (adult) (pediatric): Secondary | ICD-10-CM | POA: Diagnosis not present

## 2018-03-17 DIAGNOSIS — G4733 Obstructive sleep apnea (adult) (pediatric): Secondary | ICD-10-CM | POA: Diagnosis not present

## 2018-05-29 ENCOUNTER — Other Ambulatory Visit: Payer: Self-pay

## 2018-05-31 MED ORDER — LISINOPRIL 10 MG PO TABS: 10.0000 mg | ORAL_TABLET | Freq: Every day | ORAL | 0 refills | Status: DC

## 2018-06-16 DIAGNOSIS — G4733 Obstructive sleep apnea (adult) (pediatric): Secondary | ICD-10-CM | POA: Diagnosis not present

## 2018-06-19 ENCOUNTER — Emergency Department
Admission: EM | Admit: 2018-06-19 | Discharge: 2018-06-19 | Disposition: A | Discharge status: Home / Self Care | Payer: 59 | Attending: Emergency Medicine | Admitting: Emergency Medicine

## 2018-06-19 ENCOUNTER — Encounter: Payer: Self-pay | Admitting: Emergency Medicine

## 2018-06-19 ENCOUNTER — Emergency Department: Payer: 59

## 2018-06-19 ENCOUNTER — Other Ambulatory Visit: Payer: Self-pay

## 2018-06-19 DIAGNOSIS — S161XXA Strain of muscle, fascia and tendon at neck level, initial encounter: Secondary | ICD-10-CM | POA: Insufficient documentation

## 2018-06-19 DIAGNOSIS — I10 Essential (primary) hypertension: Secondary | ICD-10-CM | POA: Diagnosis not present

## 2018-06-19 DIAGNOSIS — Y92410 Unspecified street and highway as the place of occurrence of the external cause: Secondary | ICD-10-CM | POA: Insufficient documentation

## 2018-06-19 DIAGNOSIS — Y9389 Activity, other specified: Secondary | ICD-10-CM | POA: Diagnosis not present

## 2018-06-19 DIAGNOSIS — I480 Paroxysmal atrial fibrillation: Secondary | ICD-10-CM | POA: Insufficient documentation

## 2018-06-19 DIAGNOSIS — S4992XA Unspecified injury of left shoulder and upper arm, initial encounter: Secondary | ICD-10-CM | POA: Diagnosis not present

## 2018-06-19 DIAGNOSIS — S199XXA Unspecified injury of neck, initial encounter: Secondary | ICD-10-CM | POA: Diagnosis not present

## 2018-06-19 DIAGNOSIS — M25512 Pain in left shoulder: Secondary | ICD-10-CM | POA: Insufficient documentation

## 2018-06-19 DIAGNOSIS — Y999 Unspecified external cause status: Secondary | ICD-10-CM | POA: Diagnosis not present

## 2018-06-19 DIAGNOSIS — Z79899 Other long term (current) drug therapy: Secondary | ICD-10-CM | POA: Diagnosis not present

## 2018-06-19 DIAGNOSIS — Z7982 Long term (current) use of aspirin: Secondary | ICD-10-CM | POA: Diagnosis not present

## 2018-06-19 MED ORDER — BACLOFEN 10 MG PO TABS: 10.0000 mg | ORAL_TABLET | Freq: Every day | ORAL | 1 refills | Status: DC
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
## 2018-06-19 NOTE — Discharge Instructions (Addendum)
Follow-up with your regular doctor as needed.  Follow with Dr. Rudene Christians if you are not better in 5 7 days.  Return emergency department worsening.  Use medications as prescribed.  Apply ice to all areas that hurt.

## 2018-06-28 ENCOUNTER — Ambulatory Visit: Payer: Self-pay | Admitting: Internal Medicine

## 2018-07-28 DIAGNOSIS — J111 Influenza due to unidentified influenza virus with other respiratory manifestations: Secondary | ICD-10-CM | POA: Diagnosis not present

## 2018-07-28 DIAGNOSIS — R05 Cough: Secondary | ICD-10-CM | POA: Diagnosis not present

## 2018-08-23 DIAGNOSIS — J0100 Acute maxillary sinusitis, unspecified: Secondary | ICD-10-CM | POA: Diagnosis not present

## 2018-08-23 DIAGNOSIS — R05 Cough: Secondary | ICD-10-CM | POA: Diagnosis not present

## 2018-08-23 DIAGNOSIS — R0600 Dyspnea, unspecified: Secondary | ICD-10-CM | POA: Diagnosis not present

## 2018-09-16 DIAGNOSIS — G4733 Obstructive sleep apnea (adult) (pediatric): Secondary | ICD-10-CM | POA: Diagnosis not present

## 2018-09-25 IMAGING — MR MR ABDOMEN W/O CM
6 of 7 series · 39 of 48 positions shown · non-contrast
Comparison: Ultrasound on 10/10/2016

CLINICAL DATA: Possible left renal mass seen on recent ultrasound.
Hepatic steatosis.

EXAM:
MRI ABDOMEN WITHOUT CONTRAST
TECHNIQUE: Multiplanar multisequence MR imaging was performed without the
administration of intravenous contrast.

[Series 3: T2 · coronal · 8.0mm · 1.64mm/px · 3 of 19 slices shown]
[im 1/19]
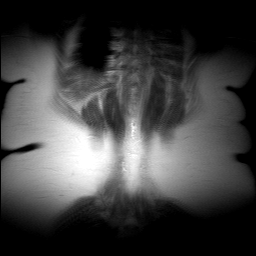
[im 10/19]
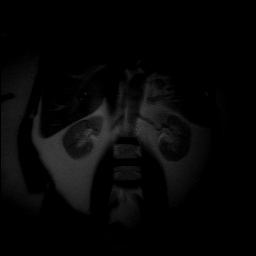
[im 19/19]
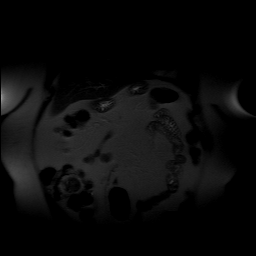

[Series 4: T2 fat-sat · axial · 8.0mm · 0.74mm/px · z∈[-78,+95]mm · 3 of 19 slices shown]
[im 1/19]
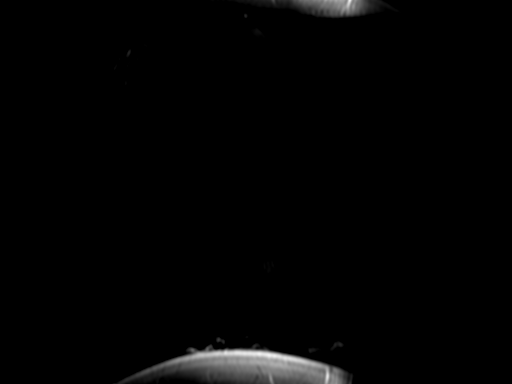
[im 10/19]
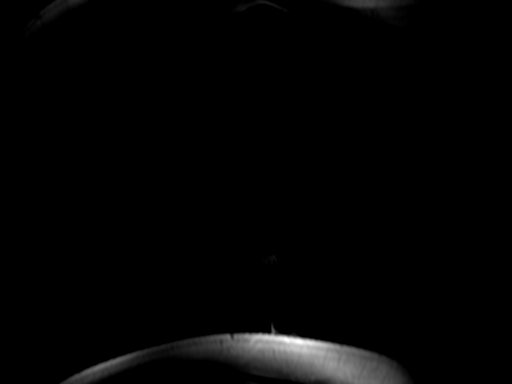
[im 19/19]
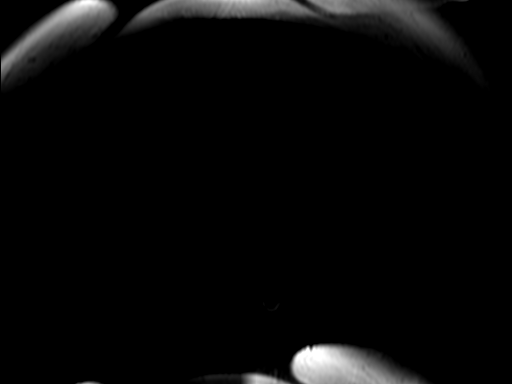

[Series 5: axial in-out of · axial · 8.0mm · 0.74mm/px · z∈[-85,+102]mm · 6 of 38 slices shown]
[im 1/38]
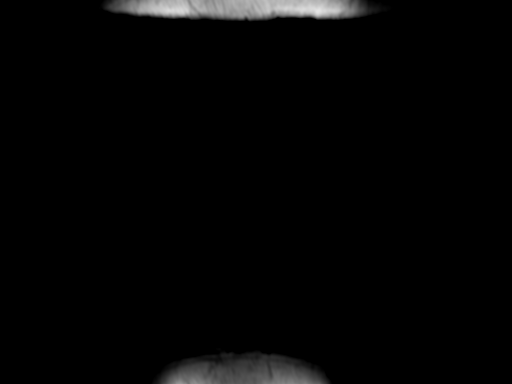
[im 8/38]
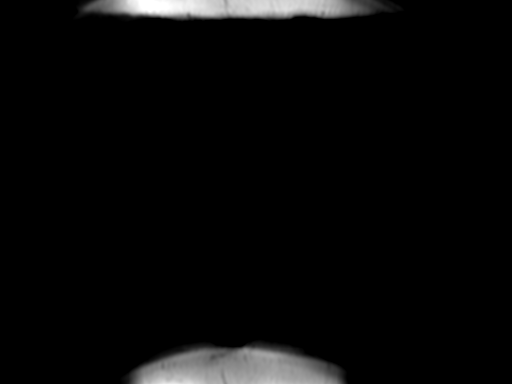
[im 15/38]
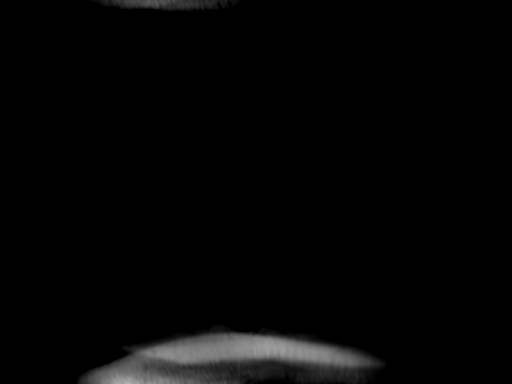
[im 23/38]
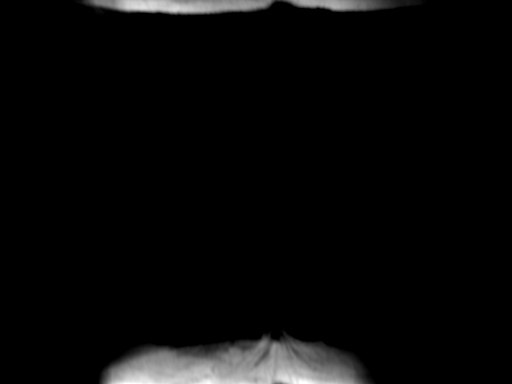
[im 30/38]
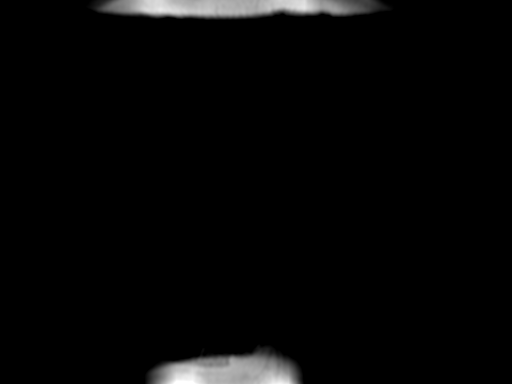
[im 38/38]
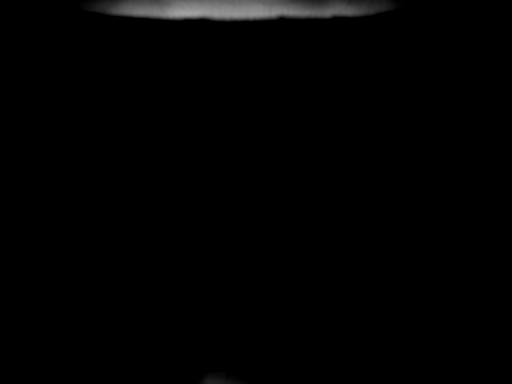

[Series 6: DWI · axial · 6.0mm · 2.97mm/px · z∈[-96,+113]mm · 15 of 88 slices shown]
[im 1/88]
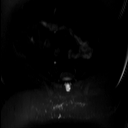
[im 7/88]
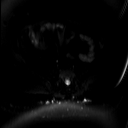
[im 13/88]
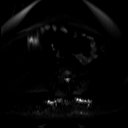
[im 19/88]
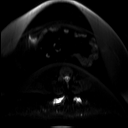
[im 25/88]
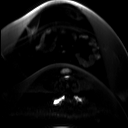
[im 32/88]
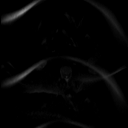
[im 38/88]
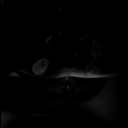
[im 44/88]
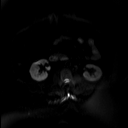
[im 50/88]
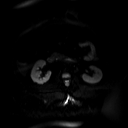
[im 56/88]
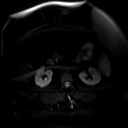
[im 63/88]
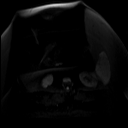
[im 69/88]
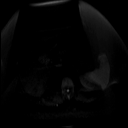
[im 75/88]
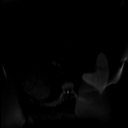
[im 81/88]
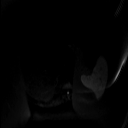
[im 88/88]
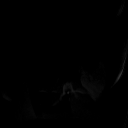

[Series 7: axial dwi_adc · axial · 6.0mm · 2.97mm/px · z∈[-96,+113]mm · 5 of 30 slices shown]
[im 1/30]
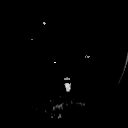
[im 8/30]
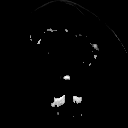
[im 15/30]
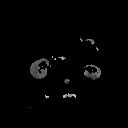
[im 22/30]
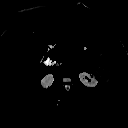
[im 30/30]
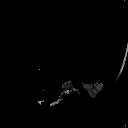

[Series 8: axial true fisp-- · axial · 4.0mm · 0.74mm/px · z∈[-84,+92]mm · 7 of 45 slices shown]
[im 1/45]
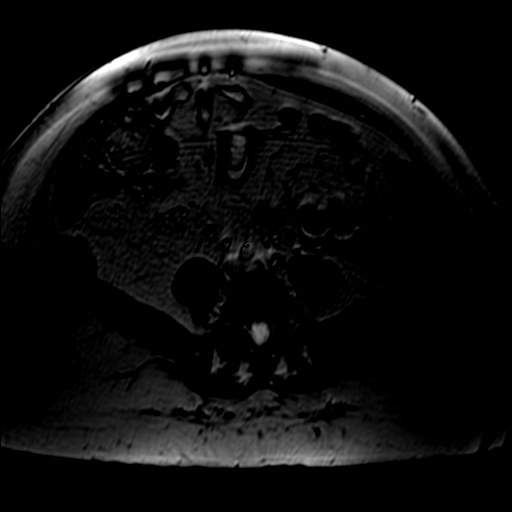
[im 8/45]
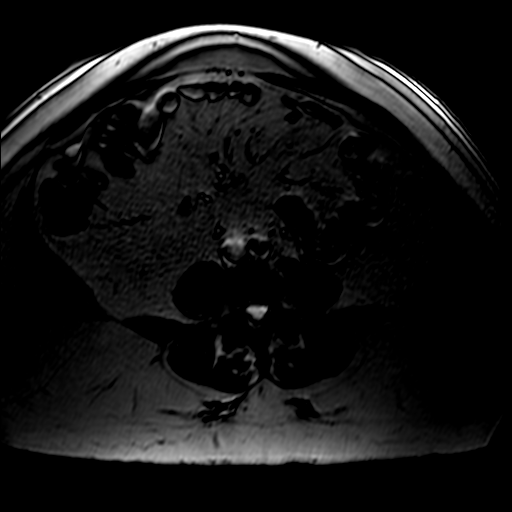
[im 15/45]
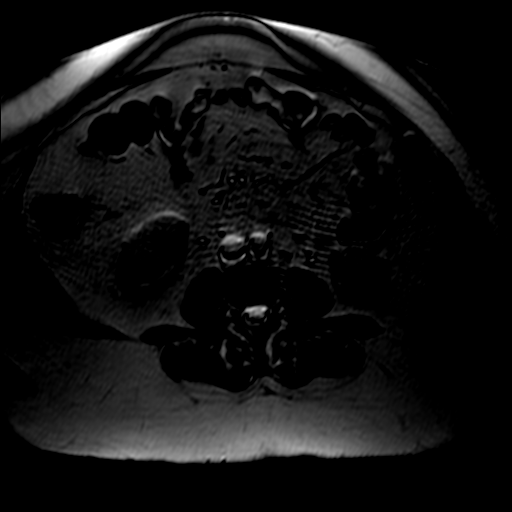
[im 23/45]
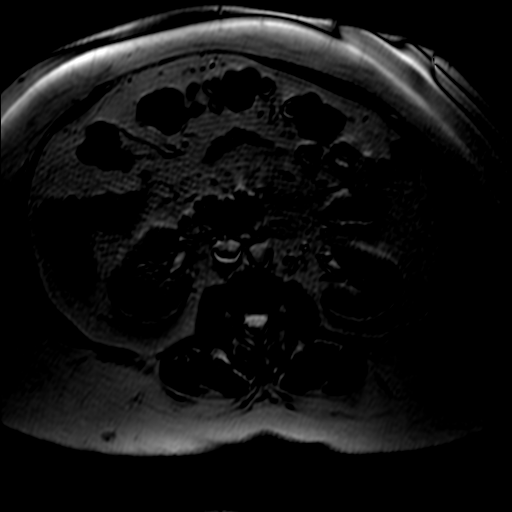
[im 30/45]
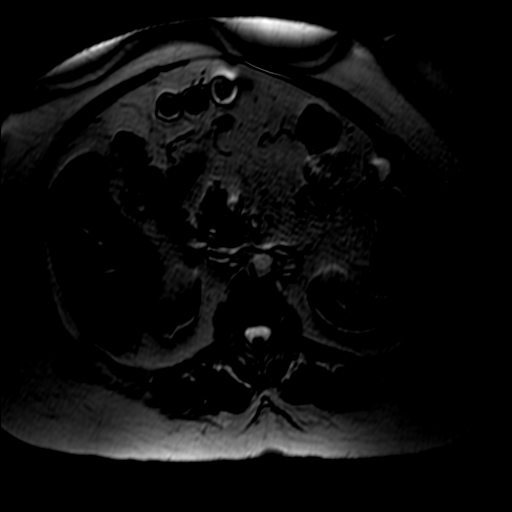
[im 37/45]
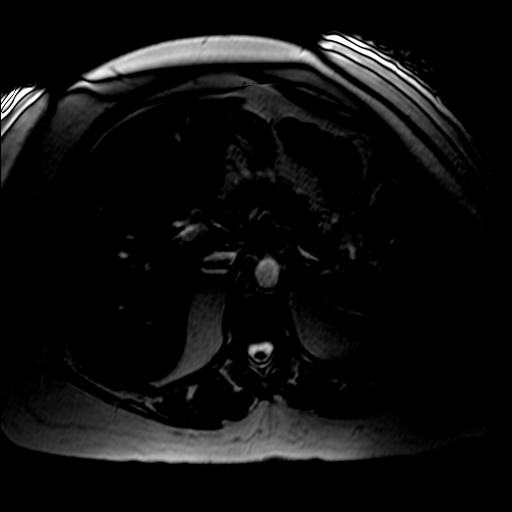
[im 45/45]
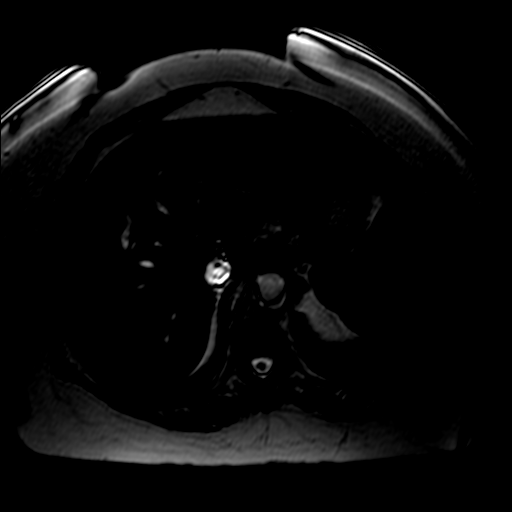

[39 of 48 positions shown; findings below may reference images not displayed]

FINDINGS: Lower chest: No acute findings.

Hepatobiliary: Diffuse hepatic steatosis demonstrated on chemical
shift imaging. No liver masses identified. Prior cholecystectomy
noted. No evidence of biliary dilatation.

Pancreas: No mass or inflammatory process visualized on this
unenhanced exam.

Spleen:  Within normal limits in size.

Adrenals/Urinary tract: Normal adrenal glands. Normal appearance of
both kidneys on this unenhanced exam. No evidence of renal masses or
hydronephrosis.

Stomach/Bowel: No evidence of obstruction, inflammatory process, or
abnormal fluid collections.

Vascular/Lymphatic: No pathologically enlarged lymph nodes
identified. No evidence of abdominal aortic aneurysm.

Other:  None.

Musculoskeletal:  No suspicious bone lesions identified.
IMPRESSION: No evidence of renal neoplasm or hydronephrosis.

Diffuse hepatic steatosis. No evidence of hepatic mass or biliary
ductal dilatation.

## 2018-10-05 ENCOUNTER — Encounter: Payer: Self-pay | Admitting: Emergency Medicine

## 2018-10-05 ENCOUNTER — Ambulatory Visit
Admission: EM | Admit: 2018-10-05 | Discharge: 2018-10-05 | Disposition: A | Discharge status: Home / Self Care | Payer: 59 | Attending: Family Medicine | Admitting: Family Medicine

## 2018-10-05 ENCOUNTER — Other Ambulatory Visit: Payer: Self-pay

## 2018-10-05 ENCOUNTER — Ambulatory Visit (INDEPENDENT_AMBULATORY_CARE_PROVIDER_SITE_OTHER): Payer: 59

## 2018-10-05 DIAGNOSIS — S92514A Nondisplaced fracture of proximal phalanx of right lesser toe(s), initial encounter for closed fracture: Secondary | ICD-10-CM

## 2018-10-05 MED ORDER — TRAMADOL HCL 50 MG PO TABS: 50.0000 mg | ORAL_TABLET | Freq: Three times a day (TID) | ORAL | 0 refills | Status: DC | PRN

## 2018-10-05 NOTE — ED Provider Notes (Signed)
[facility]    CSN: [id number] Arrival date & time: 10/05/18  1445  History   Chief Complaint Chief Complaint  Patient presents with  . Toe Pain    right 5th digit   HPI  58 year old female presents with the above complaint.  Patient states that she injured her right fifth toe 3 weeks ago.  She states that she hit her toe on the leg of a Bakers rack.  Patient states that her toe was deformed at the time of the injury.  She believes that she fractured the toe and then subsequently put it back in place with pressure.  Patient states that she has been buddy taping the toe and has been elevating it.  However, she has failed to improve over this period of time.  Continues to have pain which is worse at night and worse with activity.  She has been taking naproxen without relief.  Patient is concerned given persistence of symptoms.  No other associated symptoms.  No other complaints.  PMH, Surgical Hx, Family Hx, Social History reviewed and updated as below.  Past Medical History:  Diagnosis Date  . Arthritis    left shoulder, injected by Margaretmary Eddy 2012  . Complication of anesthesia    Sats dropped during cataract surgery  . Hyperlipidemia    hypertriglyceridemia  . Hypertension   . Menopause, premature    at age 36  . Obesity (BMI 30-39.9)   . PAF (paroxysmal atrial fibrillation) (HCC)    R/T low potassium  . Sleep apnea    CPAP   Patient Active Problem List   Diagnosis Date Noted  . Family history of malignant neoplasm of gastrointestinal tract   . Vitamin D deficiency 07/12/2016  . Morbid obesity ([place]) 07/09/2015  . Leg swelling 07/09/2015  . Chest pain 05/08/2015  . Visit for preventive health examination 06/25/2014  . Depression 05/11/2014  . Hepatic steatosis 05/11/2014  . Polyarthritis 05/11/2014  . Shortness of breath 05/17/2013  . Family history of colon cancer requiring screening colonoscopy 05/15/2013  . OSA (obstructive sleep apnea) 05/15/2013  .  Annual physical exam 05/15/2013  . Paroxysmal atrial fibrillation ([place]) 03/23/2013  . Laryngospasm 03/01/2012  . Pulmonary nodule, right 03/01/2012  . Hyperlipidemia with target LDL less than 100 02/05/2012  . Chest pain at rest 02/05/2012  . Hypertension 05/15/2011  . Headache, variant migraine 04/18/2011  . Screening for cervical cancer 04/18/2011  . Screening for breast cancer 04/18/2011  . Screening for colon cancer 04/18/2011   Past Surgical History:  Procedure Laterality Date  . CATARACT EXTRACTION W/ INTRAOCULAR LENS  IMPLANT, BILATERAL    . CHOLECYSTECTOMY  1982  . COLONOSCOPY WITH PROPOFOL N/A 04/06/2017   Procedure: COLONOSCOPY WITH PROPOFOL;  Surgeon: Lucilla Lame, MD;  Location: [facility];  Service: Gastroenterology;  Laterality: N/A;  sleep apnea  . FOOT SURGERY Right    tumor  . herniated disc    . TUBAL LIGATION     OB History   No obstetric history on file.    Home Medications    Prior to Admission medications   Medication Sig Start Date End Date Taking? Authorizing Provider  aspirin 81 MG tablet Take 81 mg by mouth daily.   Yes [provider]  DANDELION PO Take by mouth daily.   Yes [provider]  ergocalciferol (DRISDOL) 50000 units capsule Take 1 capsule (50,000 Units total) by mouth once a week. 07/13/16  Yes Crecencio Mc, MD  fluticasone Asencion Islam)  50 MCG/ACT nasal spray Place 2 sprays into both nostrils daily. 07/28/16  Yes Fisher, Linden Dolin, PA-C  MILK THISTLE PO Take by mouth daily.   Yes [provider]  TURMERIC PO Take by mouth daily.   Yes [provider]  Liraglutide -Weight Management (SAXENDA) 18 MG/3ML SOPN Inject 0.6 mg into the skin daily. Increase dose weekly as follows: Week 2: 1.2 mg daily ; Week 3: 1.8 mg daily; Week 4: 2.4 mg daily 11/23/17   Crecencio Mc, MD  traMADol (ULTRAM) 50 MG tablet Take 1 tablet (50 mg total) by mouth every 8 (eight) hours as needed. 10/05/18   Coral Spikes, DO    Family History Family History  Problem Relation Age of Onset  . Cancer Mother   . Alcohol abuse Mother   . Cancer Father   . COPD Father   . Alcohol abuse Father   . Heart disease Father   . Hypertension Father   . Hyperlipidemia Father   . Early death Sister   . COPD Sister   . Heart disease Sister   . Breast cancer Neg Hx    Social History Social History   Tobacco Use  . Smoking status: Never Smoker  . Smokeless tobacco: Never Used  Substance Use Topics  . Alcohol use: No  . Drug use: No    Allergies   Amoxicillin   Review of Systems Review of Systems  Constitutional: Negative.   Musculoskeletal:       Right 5th toe pain, swelling.    Physical Exam Triage Vital Signs ED Triage Vitals  Enc Vitals Group     BP 10/05/18 1505 123/65     Pulse Rate 10/05/18 1505 61     Resp 10/05/18 1505 18     Temp 10/05/18 1505 98.3 F (36.8 C)     Temp Source 10/05/18 1505 Oral     SpO2 10/05/18 1505 99 %     Weight 10/05/18 1501 220 lb (99.8 kg)     Height 10/05/18 1501 5\' 4"  (1.626 m)     Head Circumference --      Peak Flow --      Pain Score 10/05/18 1501 5     Pain Loc --      Pain Edu? --      Excl. in [place]? --    Updated Vital Signs BP 123/65 (BP Location: Left Arm)   Pulse 61   Temp 98.3 F (36.8 C) (Oral)   Resp 18   Ht 5\' 4"  (1.626 m)   Wt 99.8 kg   SpO2 99%   BMI 37.76 kg/m   Visual Acuity Right Eye Distance:   Left Eye Distance:   Bilateral Distance:    Right Eye Near:   Left Eye Near:    Bilateral Near:     Physical Exam Vitals signs and nursing note reviewed.  Constitutional:      General: She is not in acute distress.    Appearance: Normal appearance.  HENT:     Head: Normocephalic and atraumatic.     Nose: Nose normal.  Eyes:     General:        Right eye: No discharge.        Left eye: No discharge.     Conjunctiva/sclera: Conjunctivae normal.  Cardiovascular:     Rate and Rhythm: Normal rate and regular rhythm.   Pulmonary:     Effort: Pulmonary effort is normal.     Breath  sounds: Normal breath sounds.  Musculoskeletal:     Comments: Right 5th toe -swelling noted.  Mild erythema.  Exquisitely tender to palpation.  Neurological:     Mental Status: She is alert.  Psychiatric:        Mood and Affect: Mood normal.        Behavior: Behavior normal.    UC Treatments / Results  Labs (all labs ordered are listed, but only abnormal results are displayed) Labs Reviewed - No data to display  EKG None  Radiology Dg Foot Complete Right  Result Date: 10/05/2018 CLINICAL DATA:  Injured right foot 3 weeks ago. Persistent pain involving the fifth toe. EXAM: RIGHT FOOT COMPLETE - 3+ VIEW COMPARISON:  None. FINDINGS: There is a nondisplaced and possibly early healing oblique fracture of the proximal phalanx of the fifth toe. The joint spaces are maintained. No other fractures are identified. IMPRESSION: Nondisplaced oblique coursing fracture involving the proximal phalanx of the fifth toe. Early healing changes. Electronically Signed   By: Marijo Sanes M.D.   On: 10/05/2018 16:09    Procedures Procedures (including critical care time)  Medications Ordered in UC Medications - No data to display  Initial Impression / Assessment and Plan / UC Course  I have reviewed the triage vital signs and the nursing notes.  Pertinent labs & imaging results that were available during my care of the patient were reviewed by me and considered in my medical decision making (see chart for details).    58 year old female presents with an oblique fracture of the proximal phalanx of the right fifth toe.  Buddy taped.  Placed in a postop shoe.  Tramadol for pain.  Advised to see podiatry.  Final Clinical Impressions(s) / UC Diagnoses   Final diagnoses:  Closed nondisplaced fracture of proximal phalanx of lesser toe of right foot, initial encounter   Discharge Instructions   None    ED Prescriptions    Medication  Sig Dispense Auth. Provider   traMADol (ULTRAM) 50 MG tablet Take 1 tablet (50 mg total) by mouth every 8 (eight) hours as needed. 15 tablet Coral Spikes, DO     Controlled Substance Prescriptions [place] Controlled Substance Registry consulted? Not Applicable   Coral Spikes, DO 10/05/18 1831

## 2018-10-05 NOTE — ED Triage Notes (Signed)
Pt c/o right 5th toe pain. She reports that she stumped it on a bakers rack 3 weeks ago and has been taping, elevating and it is not getting better.

## 2018-10-27 ENCOUNTER — Ambulatory Visit: Payer: 59 | Admitting: Internal Medicine

## 2018-10-27 ENCOUNTER — Encounter: Payer: Self-pay | Admitting: Internal Medicine

## 2018-10-27 ENCOUNTER — Other Ambulatory Visit: Payer: Self-pay

## 2018-10-27 VITALS — BP 108/76 | HR 49 | Temp 98.2°F | Resp 15 | Ht 64.0 in | Wt 235.0 lb

## 2018-10-27 DIAGNOSIS — G4733 Obstructive sleep apnea (adult) (pediatric): Secondary | ICD-10-CM

## 2018-10-27 DIAGNOSIS — E785 Hyperlipidemia, unspecified: Secondary | ICD-10-CM | POA: Diagnosis not present

## 2018-10-27 DIAGNOSIS — I10 Essential (primary) hypertension: Secondary | ICD-10-CM

## 2018-10-27 DIAGNOSIS — K760 Fatty (change of) liver, not elsewhere classified: Secondary | ICD-10-CM | POA: Diagnosis not present

## 2018-10-27 DIAGNOSIS — M7989 Other specified soft tissue disorders: Secondary | ICD-10-CM

## 2018-10-27 DIAGNOSIS — Z1239 Encounter for other screening for malignant neoplasm of breast: Secondary | ICD-10-CM

## 2018-10-27 DIAGNOSIS — R0602 Shortness of breath: Secondary | ICD-10-CM

## 2018-10-27 MED ORDER — HYDROCHLOROTHIAZIDE 25 MG PO TABS: 25.0000 mg | ORAL_TABLET | Freq: Every day | ORAL | 0 refills | Status: DC
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
## 2018-10-27 NOTE — Patient Instructions (Addendum)
   I am making a decision to stop  your lisinopril  based on increased reports by one of my ENT colleagues of patients  developing tongue and throat swelling from lisinopril.  The condition , called "angioedema," can be fatal if a person's airway is compromised.   Stop the lisinopril and start hctz 1/2 tablet daily in the morning .  check BP a few times over the next 2 weeks  .  Send me t he readings via Mychart .  If bp medication is needed,  We will choose an alternative such as telmisartan at lowest dose for evening use  I have orderd fasting labs to be done at your leisure next week or the week after   Your annual mammogram has been ordered.  You are encouraged (required) to call to make your appointment at [facility] [phone]

## 2018-10-28 LAB — COMPREHENSIVE METABOLIC PANEL
ALT: 39 U/L — AB (ref 0–35)
AST: 28 U/L (ref 0–37)
Albumin: 4.4 g/dL (ref 3.5–5.2)
Alkaline Phosphatase: 79 U/L (ref 39–117)
BILIRUBIN TOTAL: 0.5 mg/dL (ref 0.2–1.2)
BUN: 11 mg/dL (ref 6–23)
CALCIUM: 9.4 mg/dL (ref 8.4–10.5)
CO2: 26 meq/L (ref 19–32)
Chloride: 105 mEq/L (ref 96–112)
Creatinine, Ser: 0.86 mg/dL (ref 0.40–1.20)
GFR: 67.7 mL/min (ref >60.00)
Glucose, Bld: 92 mg/dL (ref 70–99)
Potassium: 3.7 mEq/L (ref 3.5–5.1)
Sodium: 140 mEq/L (ref 135–145)
Total Protein: 7.1 g/dL (ref 6.0–8.3)

## 2018-10-28 NOTE — Assessment & Plan Note (Signed)
Diagnosed by sleep study. She is wearing her CPAP every night a minimum of 6 hours per night and notes improved daytime wakefulness and decreased fatigue  

## 2018-10-28 NOTE — Assessment & Plan Note (Signed)
Chronic ,  With obesity,  OSA and daily sitting all contributing.  Cardiology eval in 2016 was unable to exclude mild diastolic dysfunction as a contributor

## 2018-10-28 NOTE — Assessment & Plan Note (Addendum)
Presumed by ultrasound changes and serologies negative for autoimmune causes of hepatitis.  Currently her ALT is slightly elevated.   all modifiable risk factors including obesity and hyperlipidemia have been addressed with medications and/or advice to follow a low glycemic diet.  She has received the Hepatitis A/B vaccine series . Will recommend metformin vs statin once  She has had fasting labs and A1c

## 2018-10-28 NOTE — Assessment & Plan Note (Addendum)
Chronic ,attributed to to morbid obesity and deconditioning by Cardiology during past evaluations.  Noninvasive testing included coronary CT in nov 2016: score was zero.

## 2018-10-28 NOTE — Assessment & Plan Note (Addendum)
I am making a decision to stop patient's lisinopril  based on increased anecdotal reports of life threatening angioedema with ACE Inhibitors . Will start hctz at 12.5 mg daily given concurrent fluid retention if additional therapy is needed will consider ARB  Lab Results  Component Value Date   CREATININE 0.86 10/27/2018   Lab Results  Component Value Date   NA 140 10/27/2018   K 3.7 10/27/2018   CL 105 10/27/2018   CO2 26 10/27/2018

## 2018-11-01 ENCOUNTER — Other Ambulatory Visit (INDEPENDENT_AMBULATORY_CARE_PROVIDER_SITE_OTHER): Payer: 59

## 2018-11-01 ENCOUNTER — Other Ambulatory Visit: Payer: Self-pay

## 2018-11-01 DIAGNOSIS — E785 Hyperlipidemia, unspecified: Secondary | ICD-10-CM | POA: Diagnosis not present

## 2018-11-01 DIAGNOSIS — I10 Essential (primary) hypertension: Secondary | ICD-10-CM | POA: Diagnosis not present

## 2018-11-01 LAB — BASIC METABOLIC PANEL
BUN: 14 mg/dL (ref 6–23)
CALCIUM: 9.2 mg/dL (ref 8.4–10.5)
CO2: 27 mEq/L (ref 19–32)
CREATININE: 0.76 mg/dL (ref 0.40–1.20)
Chloride: 100 mEq/L (ref 96–112)
GFR: 78.08 mL/min (ref >60.00)
Glucose, Bld: 114 mg/dL — ABNORMAL HIGH (ref 70–99)
Potassium: 3.9 mEq/L (ref 3.5–5.1)
Sodium: 136 mEq/L (ref 135–145)

## 2018-11-01 LAB — MICROALBUMIN / CREATININE URINE RATIO
Creatinine,U: 51.8 mg/dL
Microalb Creat Ratio: 1.4 mg/g (ref 0.0–30.0)

## 2018-11-01 LAB — HEMOGLOBIN A1C: Hgb A1c MFr Bld: 5.5 % (ref 4.6–6.5)

## 2018-11-01 LAB — LIPID PANEL
CHOL/HDL RATIO: 3
Cholesterol: 168 mg/dL (ref 0–200)
HDL: 50.1 mg/dL (ref >39.00)
LDL CALC: 101 mg/dL — AB (ref 0–99)
NonHDL: 118.12
TRIGLYCERIDES: 87 mg/dL (ref 0.0–149.0)
VLDL: 17.4 mg/dL (ref 0.0–40.0)

## 2018-11-01 LAB — TSH: TSH: 2.53 u[IU]/mL (ref 0.35–4.50)

## 2018-11-12 ENCOUNTER — Encounter: Payer: Self-pay | Admitting: Internal Medicine

## 2018-12-16 DIAGNOSIS — G4733 Obstructive sleep apnea (adult) (pediatric): Secondary | ICD-10-CM | POA: Diagnosis not present

## 2018-12-22 ENCOUNTER — Other Ambulatory Visit: Payer: Self-pay | Admitting: Internal Medicine

## 2019-02-15 ENCOUNTER — Ambulatory Visit
Admission: EM | Admit: 2019-02-15 | Discharge: 2019-02-15 | Disposition: A | Discharge status: Home / Self Care | Payer: 59 | Attending: Family Medicine | Admitting: Family Medicine

## 2019-02-15 ENCOUNTER — Encounter: Payer: Self-pay | Admitting: Emergency Medicine

## 2019-02-15 ENCOUNTER — Other Ambulatory Visit: Payer: Self-pay

## 2019-02-15 DIAGNOSIS — W108XXA Fall (on) (from) other stairs and steps, initial encounter: Secondary | ICD-10-CM

## 2019-02-15 DIAGNOSIS — M25561 Pain in right knee: Secondary | ICD-10-CM | POA: Diagnosis not present

## 2019-02-15 DIAGNOSIS — M25562 Pain in left knee: Secondary | ICD-10-CM

## 2019-02-15 DIAGNOSIS — M25511 Pain in right shoulder: Secondary | ICD-10-CM

## 2019-02-15 MED ORDER — MELOXICAM 15 MG PO TABS: 15.0000 mg | ORAL_TABLET | Freq: Every day | ORAL | 0 refills | Status: DC | PRN

## 2019-02-15 MED ORDER — TRAMADOL HCL 50 MG PO TABS: 50.0000 mg | ORAL_TABLET | Freq: Three times a day (TID) | ORAL | 0 refills | Status: DC | PRN

## 2019-02-15 NOTE — ED Provider Notes (Signed)
[facility]    CSN: [id number] Arrival date & time: 02/15/19  1520  History   Chief Complaint Chief Complaint  Patient presents with  . Fall   HPI   58 year old female presents with knee pain and right shoulder pain.  Patient reports that she fell "up the steps" 4 weeks ago. Since that time, she has been experiencing bilateral knee pain as well as right shoulder pain. Pain is 8/10 in severity. Worse with activity. Interfering with sleep. She has been taking tylenol/ibuprofen, resting, using ice/heat without resolution. She reports decreased ROM. No other associated symptoms. No other complaints.  PMH, Surgical Hx, Family Hx, Social History reviewed and updated as below.  Past Medical History:  Diagnosis Date  . Arthritis    left shoulder, injected by Margaretmary Eddy 2012  . Complication of anesthesia    Sats dropped during cataract surgery  . Hyperlipidemia    hypertriglyceridemia  . Hypertension   . Menopause, premature    at age 7  . Obesity (BMI 30-39.9)   . PAF (paroxysmal atrial fibrillation) (HCC)    R/T low potassium  . Sleep apnea    CPAP    Patient Active Problem List   Diagnosis Date Noted  . Family history of malignant neoplasm of gastrointestinal tract   . Vitamin D deficiency 07/12/2016  . Morbid obesity ([place]) 07/09/2015  . Leg swelling 07/09/2015  . Chest pain 05/08/2015  . Visit for preventive health examination 06/25/2014  . Depression 05/11/2014  . Hepatic steatosis 05/11/2014  . Polyarthritis 05/11/2014  . Shortness of breath 05/17/2013  . Family history of colon cancer requiring screening colonoscopy 05/15/2013  . OSA (obstructive sleep apnea) 05/15/2013  . Annual physical exam 05/15/2013  . Paroxysmal atrial fibrillation ([place]) 03/23/2013  . Laryngospasm 03/01/2012  . Pulmonary nodule, right 03/01/2012  . Hyperlipidemia with target LDL less than 100 02/05/2012  . Chest pain at rest 02/05/2012  . Hypertension 05/15/2011  .  Headache, variant migraine 04/18/2011  . Screening for cervical cancer 04/18/2011  . Screening for breast cancer 04/18/2011  . Screening for colon cancer 04/18/2011    Past Surgical History:  Procedure Laterality Date  . CATARACT EXTRACTION W/ INTRAOCULAR LENS  IMPLANT, BILATERAL    . CHOLECYSTECTOMY  1982  . COLONOSCOPY WITH PROPOFOL N/A 04/06/2017   Procedure: COLONOSCOPY WITH PROPOFOL;  Surgeon: Lucilla Lame, MD;  Location: [facility];  Service: Gastroenterology;  Laterality: N/A;  sleep apnea  . FOOT SURGERY Right    tumor  . herniated disc    . TUBAL LIGATION      OB History   No obstetric history on file.      Home Medications    Prior to Admission medications   Medication Sig Start Date End Date Taking? Authorizing Provider  aspirin 81 MG tablet Take 81 mg by mouth daily.   Yes [provider]  DANDELION PO Take by mouth daily.   Yes [provider]  hydrochlorothiazide (HYDRODIURIL) 25 MG tablet Take 1 tablet (25 mg total) by mouth daily. 12/23/18  Yes Crecencio Mc, MD  MILK THISTLE PO Take by mouth daily.   Yes [provider]  TURMERIC PO Take by mouth daily.   Yes [provider]  VITAMIN D PO Take by mouth.   Yes [provider]  lisinopril (PRINIVIL,ZESTRIL) 10 MG tablet Take 10 mg by mouth daily.    [provider]  meloxicam (MOBIC) 15 MG tablet Take 1 tablet (15  mg total) by mouth daily as needed. 02/15/19   Coral Spikes, DO  traMADol (ULTRAM) 50 MG tablet Take 1 tablet (50 mg total) by mouth every 8 (eight) hours as needed. 02/15/19   Coral Spikes, DO    Family History Family History  Problem Relation Age of Onset  . Cancer Mother   . Alcohol abuse Mother   . Cancer Father   . COPD Father   . Alcohol abuse Father   . Heart disease Father   . Hypertension Father   . Hyperlipidemia Father   . Early death Sister   . COPD Sister   . Heart disease Sister   . Breast cancer Neg Hx      Social History Social History   Tobacco Use  . Smoking status: Never Smoker  . Smokeless tobacco: Never Used  Substance Use Topics  . Alcohol use: No  . Drug use: No     Allergies   Amoxicillin   Review of Systems Review of Systems  Constitutional: Negative.   Musculoskeletal:       Knee pain, shoulder pain.   Physical Exam Triage Vital Signs ED Triage Vitals  Enc Vitals Group     BP 02/15/19 1556 140/64     Pulse Rate 02/15/19 1556 64     Resp 02/15/19 1556 18     Temp 02/15/19 1556 98.3 F (36.8 C)     Temp Source 02/15/19 1556 Oral     SpO2 02/15/19 1556 98 %     Weight 02/15/19 1552 230 lb (104.3 kg)     Height 02/15/19 1552 5\' 4"  (1.626 m)     Head Circumference --      Peak Flow --      Pain Score 02/15/19 1552 8     Pain Loc --      Pain Edu? --      Excl. in [place]? --    Updated Vital Signs BP 140/64 (BP Location: Left Arm)   Pulse 64   Temp 98.3 F (36.8 C) (Oral)   Resp 18   Ht 5\' 4"  (1.626 m)   Wt 104.3 kg   SpO2 98%   BMI 39.48 kg/m   Visual Acuity Right Eye Distance:   Left Eye Distance:   Bilateral Distance:    Right Eye Near:   Left Eye Near:    Bilateral Near:     Physical Exam Vitals signs and nursing note reviewed.  Constitutional:      General: She is not in acute distress.    Appearance: Normal appearance.  HENT:     Head: Normocephalic and atraumatic.  Eyes:     General:        Right eye: No discharge.        Left eye: No discharge.     Conjunctiva/sclera: Conjunctivae normal.  Pulmonary:     Effort: Pulmonary effort is normal. No respiratory distress.  Musculoskeletal:     Comments: Knees - No apparent effusion. No joint line tenderness. Ligaments intact.  Right shoulder - Decreased ROM. 4/5 infraspinatus/teres minor muscle strength.   Skin:    General: Skin is warm.     Findings: No bruising.  Neurological:     Mental Status: She is alert.  Psychiatric:        Mood and Affect: Mood normal.        Behavior:  Behavior normal.     UC Treatments / Results  Labs (all labs ordered are listed,  but only abnormal results are displayed) Labs Reviewed - No data to display  EKG   Radiology No results found.  Procedures Procedures (including critical care time)  Medications Ordered in UC Medications - No data to display  Initial Impression / Assessment and Plan / UC Course  I have reviewed the triage vital signs and the nursing notes.  Pertinent labs & imaging results that were available during my care of the patient were reviewed by me and considered in my medical decision making (see chart for details).    58 year old female presents with knee pain and shoulder pain. Sending for outpatient xray (our machine is currently unable to do the images needed). Mobic and tramadol as directed.   Final Clinical Impressions(s) / UC Diagnoses   Final diagnoses:  Pain in both knees, unspecified chronicity  Right shoulder pain, unspecified chronicity     Discharge Instructions     [facility] [address], [place], [place] [address] - Go here for your imaging.   We will call with the results.  Medications as prescribed.  Take care  Dr. Lacinda Axon    ED Prescriptions    Medication Sig Dispense Auth. Provider   meloxicam (MOBIC) 15 MG tablet Take 1 tablet (15 mg total) by mouth daily as needed. 30 tablet Ainslie Mazurek G, DO   traMADol (ULTRAM) 50 MG tablet Take 1 tablet (50 mg total) by mouth every 8 (eight) hours as needed. 15 tablet Coral Spikes, DO     Controlled Substance Prescriptions [place] Controlled Substance Registry consulted? Not Applicable   Coral Spikes, DO 02/15/19 2330

## 2019-02-15 NOTE — ED Triage Notes (Signed)
Pt fell up the stiars about 4 weeks ago. She is c/o bilateral knee pain. And right shoulder pain. She has been constantly taking ibuprofen, tylenol. Ice/heat, rest without relief. The pain is keeping her up at night.

## 2019-02-15 NOTE — Discharge Instructions (Signed)
[facility], [place], [place] [address] - Go here for your imaging.   We will call with the results.  Medications as prescribed.  Take care  Dr. Lacinda Axon

## 2019-02-16 ENCOUNTER — Ambulatory Visit
Admission: RE | Admit: 2019-02-16 | Discharge: 2019-02-16 | Disposition: A | Discharge status: Home / Self Care | Payer: 59 | Source: Ambulatory Visit | Attending: Family Medicine | Admitting: Family Medicine

## 2019-02-16 ENCOUNTER — Ambulatory Visit
Admission: RE | Admit: 2019-02-16 | Discharge: 2019-02-16 | Disposition: A | Discharge status: Home / Self Care | Payer: 59 | Attending: Family Medicine | Admitting: Family Medicine

## 2019-02-16 DIAGNOSIS — W19XXXA Unspecified fall, initial encounter: Secondary | ICD-10-CM | POA: Insufficient documentation

## 2019-02-16 DIAGNOSIS — M25561 Pain in right knee: Secondary | ICD-10-CM | POA: Diagnosis not present

## 2019-02-16 DIAGNOSIS — T1490XA Injury, unspecified, initial encounter: Secondary | ICD-10-CM | POA: Insufficient documentation

## 2019-02-16 DIAGNOSIS — M1712 Unilateral primary osteoarthritis, left knee: Secondary | ICD-10-CM | POA: Diagnosis not present

## 2019-02-16 DIAGNOSIS — S4991XA Unspecified injury of right shoulder and upper arm, initial encounter: Secondary | ICD-10-CM | POA: Diagnosis not present

## 2019-02-16 DIAGNOSIS — M25511 Pain in right shoulder: Secondary | ICD-10-CM | POA: Diagnosis not present

## 2019-02-16 DIAGNOSIS — M1711 Unilateral primary osteoarthritis, right knee: Secondary | ICD-10-CM | POA: Diagnosis not present

## 2019-03-02 DIAGNOSIS — M1711 Unilateral primary osteoarthritis, right knee: Secondary | ICD-10-CM | POA: Diagnosis not present

## 2019-03-02 DIAGNOSIS — S8391XA Sprain of unspecified site of right knee, initial encounter: Secondary | ICD-10-CM | POA: Diagnosis not present

## 2019-03-17 DIAGNOSIS — G4733 Obstructive sleep apnea (adult) (pediatric): Secondary | ICD-10-CM | POA: Diagnosis not present

## 2019-03-23 ENCOUNTER — Other Ambulatory Visit: Payer: Self-pay | Admitting: Internal Medicine

## 2019-03-23 MED ORDER — HYDROCHLOROTHIAZIDE 25 MG PO TABS: 25.0000 mg | ORAL_TABLET | Freq: Every day | ORAL | 3 refills | Status: DC

## 2019-03-23 NOTE — Telephone Encounter (Signed)
Refill sent.

## 2019-03-23 NOTE — Telephone Encounter (Signed)
Medication Refill - Medication: hydrochlorothiazide (HYDRODIURIL) 25 MG tablet   Preferred Pharmacy (with phone number or street name):  [facility], [place] - [place] [phone] (Phone) [phone] (Fax)

## 2019-04-29 ENCOUNTER — Ambulatory Visit (INDEPENDENT_AMBULATORY_CARE_PROVIDER_SITE_OTHER): Payer: 59 | Admitting: Internal Medicine

## 2019-04-29 ENCOUNTER — Other Ambulatory Visit: Payer: Self-pay

## 2019-04-29 ENCOUNTER — Encounter: Payer: Self-pay | Admitting: Internal Medicine

## 2019-04-29 VITALS — BP 130/62 | HR 69 | Ht 64.0 in | Wt 227.0 lb

## 2019-04-29 DIAGNOSIS — E785 Hyperlipidemia, unspecified: Secondary | ICD-10-CM

## 2019-04-29 DIAGNOSIS — E1140 Type 2 diabetes mellitus with diabetic neuropathy, unspecified: Secondary | ICD-10-CM

## 2019-04-29 DIAGNOSIS — I4902 Ventricular flutter: Secondary | ICD-10-CM

## 2019-04-29 DIAGNOSIS — Z1239 Encounter for other screening for malignant neoplasm of breast: Secondary | ICD-10-CM | POA: Diagnosis not present

## 2019-04-29 DIAGNOSIS — K760 Fatty (change of) liver, not elsewhere classified: Secondary | ICD-10-CM | POA: Diagnosis not present

## 2019-04-29 DIAGNOSIS — M25561 Pain in right knee: Secondary | ICD-10-CM | POA: Diagnosis not present

## 2019-04-29 DIAGNOSIS — G629 Polyneuropathy, unspecified: Secondary | ICD-10-CM

## 2019-04-29 DIAGNOSIS — I480 Paroxysmal atrial fibrillation: Secondary | ICD-10-CM

## 2019-04-29 DIAGNOSIS — Z0000 Encounter for general adult medical examination without abnormal findings: Secondary | ICD-10-CM

## 2019-04-29 DIAGNOSIS — M130 Polyarthritis, unspecified: Secondary | ICD-10-CM

## 2019-04-29 MED ORDER — GABAPENTIN 100 MG PO CAPS: 100.0000 mg | ORAL_CAPSULE | Freq: Three times a day (TID) | ORAL | 3 refills | Status: DC

## 2019-04-29 MED ORDER — MELOXICAM 15 MG PO TABS: 15.0000 mg | ORAL_TABLET | Freq: Every day | ORAL | 5 refills | Status: DC | PRN

## 2019-04-29 MED ORDER — TRAMADOL HCL 50 MG PO TABS: 50.0000 mg | ORAL_TABLET | Freq: Four times a day (QID) | ORAL | 0 refills | Status: AC | PRN

## 2019-04-29 NOTE — Patient Instructions (Signed)
I have refilled the tramadol for you to use at home if needed for knee pain  Your foot pain sounds like a neuropathy; you can use gabapentin 100 mg every 8 hours or just once daily in gradually increased dose if needed  PT referral to [facility] made  4 hour fast needed for future lab appointment   GET YOUR MAMMOGRAM DONE!  [place]  [phone]

## 2019-04-29 NOTE — Progress Notes (Signed)
Patient ID: Jessica Clayton, female    DOB: 12/27/1960  Age: 58 y.o. MRN: [id number]  The patient is here for annual Medicare wellness examination and management of other chronic and acute problems.   The risk factors are reflected in the social history.  The roster of all physicians providing medical care to patient - is listed in the Snapshot section of the chart.  Activities of daily living:  The patient is 100% independent in all ADLs: dressing, toileting, feeding as well as independent mobility  Home safety : The patient has smoke detectors in the home. They wear seatbelts.  There are no firearms at home. There is no violence in the home.   There is no risks for hepatitis, STDs or HIV. There is no   history of blood transfusion. They have no travel history to infectious disease endemic areas of the world.  The patient has seen their dentist in the last six month. They have seen their eye doctor in the last year. They admit to slight hearing difficulty with regard to whispered voices and some television programs.  They have deferred audiologic testing in the last year.  They do not  have excessive sun exposure. Discussed the need for sun protection: hats, long sleeves and use of sunscreen if there is significant sun exposure.   Diet: the importance of a healthy diet is discussed. They do have a healthy diet.  The benefits of regular aerobic exercise were discussed. She walks 4 times per week ,  20 minutes.   Depression screen: there are no signs or vegative symptoms of depression- irritability, change in appetite, anhedonia, sadness/tearfullness.  Cognitive assessment: the patient manages all their financial and personal affairs and is actively engaged. They could relate day,date,year and events; recalled 2/3 objects at 3 minutes; performed clock-face test normally.  The following portions of the patient's history were reviewed and updated as appropriate: allergies, current  medications, past family history, past medical history,  past surgical history, past social history  and problem list.  Visual acuity was not assessed per patient preference since she has regular follow up with her ophthalmologist. Hearing and body mass index were assessed and reviewed.   During the course of the visit the patient was educated and counseled about appropriate screening and preventive services including : fall prevention , diabetes screening, nutrition counseling, colorectal cancer screening, and recommended immunizations.    CC: The primary encounter diagnosis was Annual physical exam. Diagnoses of Right medial knee pain, Type 2 diabetes mellitus with diabetic neuropathy, without long-term current use of insulin ([place]), Neuropathy, Hyperlipidemia with target LDL less than 100, Ventricular flutter ([place]), Paroxysmal atrial fibrillation ([place]), Screening for breast cancer, Hepatic steatosis, and Polyarthritis were also pertinent to this visit.   follow up on acute and chronic issues  Hypertension: patient checks blood pressure twice weekly at home.  Readings have been for the most part < 130/80 at rest . Patient is following a reduced salt diet most days and is taking medications as prescribed.  She has not lost any more weight , but has not gained either.   Bilateral knee pain after falling up a short flight  Of stairs.  ahs had r knee injected and wore a brace for an apparent Baker's Cyst   Left foot has developed an uncomfortable burning sensation along the lateral part of foot . She has developed mild venous stasis changes .  The pain improves with direct pressure  Left shoulder pain :  Due  to DJD cannot abduct arm r flex arm past 180 degrees .  PT recommended by Ortho but deferred until today's discussion    History Miryah has a past medical history of Arthritis, Complication of anesthesia, Hyperlipidemia, Hypertension, Menopause, premature, Obesity (BMI 30-39.9), PAF (paroxysmal  atrial fibrillation) ([place]), and Sleep apnea.   She has a past surgical history that includes Cholecystectomy (1982); Foot surgery (Right); herniated disc; Tubal ligation; Cataract extraction w/ intraocular lens  implant, bilateral; and Colonoscopy with propofol (N/A, 04/06/2017).   Her family history includes Alcohol abuse in her father and mother; COPD in her father and sister; Cancer in her father and mother; Early death in her sister; Heart disease in her father and sister; Hyperlipidemia in her father; Hypertension in her father.She reports that she has never smoked. She has never used smokeless tobacco. She reports that she does not drink alcohol or use drugs.  Outpatient Medications Prior to Visit  Medication Sig Dispense Refill  . aspirin 81 MG tablet Take 81 mg by mouth daily.    Marland Kitchen DANDELION PO Take by mouth daily.    . hydrochlorothiazide (HYDRODIURIL) 25 MG tablet Take 1 tablet (25 mg total) by mouth daily. 30 tablet 3  . MILK THISTLE PO Take by mouth daily.    . TURMERIC PO Take by mouth daily.    Marland Kitchen VITAMIN D PO Take by mouth.    . meloxicam (MOBIC) 15 MG tablet Take 1 tablet (15 mg total) by mouth daily as needed. 30 tablet 0  . lisinopril (PRINIVIL,ZESTRIL) 10 MG tablet Take 10 mg by mouth daily.    . traMADol (ULTRAM) 50 MG tablet Take 1 tablet (50 mg total) by mouth every 8 (eight) hours as needed. (Patient not taking: Reported on 04/29/2019) 15 tablet 0   No facility-administered medications prior to visit.     Review of Systems   Patient denies headache, fevers, malaise, unintentional weight loss, skin rash, eye pain, sinus congestion and sinus pain, sore throat, dysphagia,  hemoptysis , cough, dyspnea, wheezing, chest pain, palpitations, orthopnea, edema, abdominal pain, nausea, melena, diarrhea, constipation, flank pain, dysuria, hematuria, urinary  Frequency, nocturia, numbness, tingling, seizures,  Focal weakness, Loss of consciousness,  Tremor, insomnia, depression,  anxiety, and suicidal ideation.      Objective:  BP 130/62   Pulse 69   Ht '5\' 4"'$  (1.626 m)   Wt 227 lb (103 kg)   BMI 38.96 kg/m   Physical Exam  .General appearance: alert, cooperative and appears stated age Ears: normal TM's and external ear canals both ears Throat: lips, mucosa, and tongue normal; teeth and gums normal Neck: no adenopathy, no carotid bruit, supple, symmetrical, trachea midline and thyroid not enlarged, symmetric, no tenderness/mass/nodules Back: symmetric, no curvature. ROM normal. No CVA tenderness. Lungs: clear to auscultation bilaterally Heart: regular rate and rhythm, S1, S2 normal, no murmur, click, rub or gallop Abdomen: soft, non-tender; bowel sounds normal; no masses,  no organomegaly Pulses: 2+ and symmetric Skin: Skin color, texture, turgor normal. No rashes or lesions Lymph nodes: Cervical, supraclavicular, and axillary nodes normal.  Assessment & Plan:   Problem List Items Addressed This Visit      Unprioritized   Paroxysmal atrial fibrillation ([place])   Hyperlipidemia with target LDL less than 100   Screening for breast cancer    Ordered in March  But postponed.  Reminder given      Annual physical exam - Primary    age appropriate education and counseling updated, referrals for preventative  services and immunizations addressed, dietary and smoking counseling addressed, most recent labs reviewed.  I have personally reviewed and have noted:  1) the patient's medical and social history 2) The pt's use of alcohol, tobacco, and illicit drugs 3) The patient's current medications and supplements 4) Functional ability including ADL's, fall risk, home safety risk, hearing and visual impairment 5) Diet and physical activities 6) Evidence for depression or mood disorder 7) The patient's height, weight, and BMI have been recorded in the chart  I have made referrals, and provided counseling and education based on review of the above      Hepatic  steatosis    Presumed by ultrasound changes and serologies negative for autoimmune causes of hepatitis. At last visit, her ALT is slightly elevated.   all modifiable risk factors including obesity and hyperlipidemia have been addressed with medications and/or advice to follow a low glycemic diet.  She has received the Hepatitis A/B vaccine series . Will recommend metformin vs statin once  She has had fasting labs and A1c    Lab Results  Component Value Date   ALT 39 (H) 10/27/2018   AST 28 10/27/2018   ALKPHOS 79 10/27/2018   BILITOT 0.5 10/27/2018         Polyarthritis    Checking ESR .Marland Kitchen  Adding tramadol for pain relief         Ventricular flutter (HCC)   Neuropathy    Serologies for diabetes, thyroid etc.  gabapentin started       Relevant Orders   Vitamin B12   TSH   RBC Folate    Other Visit Diagnoses    Right medial knee pain       Relevant Orders   Ambulatory referral to Physical Therapy   Type 2 diabetes mellitus with diabetic neuropathy, without long-term current use of insulin ([place])       Relevant Orders   Comprehensive metabolic panel   Lipid panel      I have discontinued Thayer Headings C. Plazola's lisinopril. I have also changed her traMADol. Additionally, I am having her start on gabapentin. Lastly, I am having her maintain her aspirin, MILK THISTLE PO, DANDELION PO, TURMERIC PO, VITAMIN D PO, hydrochlorothiazide, and meloxicam.  Meds ordered this encounter  Medications  . meloxicam (MOBIC) 15 MG tablet    Sig: Take 1 tablet (15 mg total) by mouth daily as needed.    Dispense:  30 tablet    Refill:  5  . gabapentin (NEURONTIN) 100 MG capsule    Sig: Take 1 capsule (100 mg total) by mouth 3 (three) times daily.    Dispense:  90 capsule    Refill:  3  . traMADol (ULTRAM) 50 MG tablet    Sig: Take 1 tablet (50 mg total) by mouth every 6 (six) hours as needed for up to 7 days.    Dispense:  28 tablet    Refill:  0    Medications Discontinued During This  Encounter  Medication Reason  . lisinopril (PRINIVIL,ZESTRIL) 10 MG tablet Change in therapy  . meloxicam (MOBIC) 15 MG tablet Reorder  . traMADol (ULTRAM) 50 MG tablet Reorder    Follow-up: No follow-ups on file.   Crecencio Mc, MD

## 2019-05-01 DIAGNOSIS — G629 Polyneuropathy, unspecified: Secondary | ICD-10-CM | POA: Insufficient documentation

## 2019-05-01 DIAGNOSIS — I4902 Ventricular flutter: Secondary | ICD-10-CM | POA: Insufficient documentation

## 2019-05-01 NOTE — Assessment & Plan Note (Signed)

## 2019-05-01 NOTE — Assessment & Plan Note (Signed)
Checking ESR .Marland Kitchen  Adding tramadol for pain relief

## 2019-05-01 NOTE — Assessment & Plan Note (Signed)
Presumed by ultrasound changes and serologies negative for autoimmune causes of hepatitis. At last visit, her ALT is slightly elevated.   all modifiable risk factors including obesity and hyperlipidemia have been addressed with medications and/or advice to follow a low glycemic diet.  She has received the Hepatitis A/B vaccine series . Will recommend metformin vs statin once  She has had fasting labs and A1c    Lab Results  Component Value Date   ALT 39 (H) 10/27/2018   AST 28 10/27/2018   ALKPHOS 79 10/27/2018   BILITOT 0.5 10/27/2018

## 2019-05-01 NOTE — Assessment & Plan Note (Signed)
Serologies for diabetes, thyroid etc.  gabapentin started

## 2019-05-01 NOTE — Assessment & Plan Note (Signed)
Ordered in March  But postponed.  Reminder given

## 2019-05-03 ENCOUNTER — Other Ambulatory Visit: Payer: Self-pay

## 2019-05-03 ENCOUNTER — Other Ambulatory Visit (INDEPENDENT_AMBULATORY_CARE_PROVIDER_SITE_OTHER): Payer: 59

## 2019-05-03 DIAGNOSIS — G629 Polyneuropathy, unspecified: Secondary | ICD-10-CM

## 2019-05-03 DIAGNOSIS — E1140 Type 2 diabetes mellitus with diabetic neuropathy, unspecified: Secondary | ICD-10-CM

## 2019-05-03 DIAGNOSIS — E871 Hypo-osmolality and hyponatremia: Secondary | ICD-10-CM

## 2019-05-03 DIAGNOSIS — E538 Deficiency of other specified B group vitamins: Secondary | ICD-10-CM

## 2019-05-03 LAB — LIPID PANEL
Cholesterol: 159 mg/dL (ref 0–200)
HDL: 54.3 mg/dL (ref >39.00)
LDL Cholesterol: 89 mg/dL (ref 0–99)
NonHDL: 104.7
Total CHOL/HDL Ratio: 3
Triglycerides: 78 mg/dL (ref 0.0–149.0)
VLDL: 15.6 mg/dL (ref 0.0–40.0)

## 2019-05-03 LAB — VITAMIN B12: Vitamin B-12: 117 pg/mL — ABNORMAL LOW (ref 211–911)

## 2019-05-03 LAB — COMPREHENSIVE METABOLIC PANEL
ALT: 36 U/L — ABNORMAL HIGH (ref 0–35)
AST: 28 U/L (ref 0–37)
Albumin: 4.2 g/dL (ref 3.5–5.2)
Alkaline Phosphatase: 75 U/L (ref 39–117)
BUN: 14 mg/dL (ref 6–23)
CO2: 30 mEq/L (ref 19–32)
Calcium: 9.2 mg/dL (ref 8.4–10.5)
Chloride: 91 mEq/L — ABNORMAL LOW (ref 96–112)
Creatinine, Ser: 0.72 mg/dL (ref 0.40–1.20)
GFR: 82.96 mL/min (ref >60.00)
Glucose, Bld: 100 mg/dL — ABNORMAL HIGH (ref 70–99)
Potassium: 3.5 mEq/L (ref 3.5–5.1)
Sodium: 129 mEq/L — ABNORMAL LOW (ref 135–145)
Total Bilirubin: 0.6 mg/dL (ref 0.2–1.2)
Total Protein: 7 g/dL (ref 6.0–8.3)

## 2019-05-03 LAB — TSH: TSH: 2.52 u[IU]/mL (ref 0.35–4.50)

## 2019-05-04 LAB — FOLATE RBC: RBC Folate: 711 ng/mL RBC (ref >280)

## 2019-05-05 NOTE — Addendum Note (Signed)
Addended by: Crecencio Mc on: 05/05/2019 07:01 PM   Modules accepted: Orders

## 2019-05-07 DIAGNOSIS — Z20828 Contact with and (suspected) exposure to other viral communicable diseases: Secondary | ICD-10-CM | POA: Diagnosis not present

## 2019-05-09 ENCOUNTER — Ambulatory Visit: Payer: 59

## 2019-05-09 ENCOUNTER — Other Ambulatory Visit (INDEPENDENT_AMBULATORY_CARE_PROVIDER_SITE_OTHER): Payer: 59

## 2019-05-09 DIAGNOSIS — E538 Deficiency of other specified B group vitamins: Secondary | ICD-10-CM | POA: Diagnosis not present

## 2019-05-09 DIAGNOSIS — E871 Hypo-osmolality and hyponatremia: Secondary | ICD-10-CM

## 2019-05-10 LAB — BASIC METABOLIC PANEL
BUN: 11 mg/dL (ref 6–23)
CO2: 30 mEq/L (ref 19–32)
Calcium: 9.1 mg/dL (ref 8.4–10.5)
Chloride: 102 mEq/L (ref 96–112)
Creatinine, Ser: 0.71 mg/dL (ref 0.40–1.20)
GFR: 84.31 mL/min (ref >60.00)
Glucose, Bld: 88 mg/dL (ref 70–99)
Potassium: 3.9 mEq/L (ref 3.5–5.1)
Sodium: 139 mEq/L (ref 135–145)

## 2019-05-11 ENCOUNTER — Ambulatory Visit: Payer: 59 | Attending: Internal Medicine

## 2019-05-11 LAB — INTRINSIC FACTOR ANTIBODIES: Intrinsic Factor: NEGATIVE

## 2019-05-19 ENCOUNTER — Other Ambulatory Visit: Payer: Self-pay | Admitting: Internal Medicine

## 2019-05-19 MED ORDER — FUROSEMIDE 20 MG PO TABS: 20.0000 mg | ORAL_TABLET | Freq: Every day | ORAL | 3 refills | Status: DC | PRN

## 2019-06-16 DIAGNOSIS — G4733 Obstructive sleep apnea (adult) (pediatric): Secondary | ICD-10-CM | POA: Diagnosis not present

## 2019-07-13 ENCOUNTER — Ambulatory Visit: Payer: Self-pay | Admitting: Internal Medicine

## 2019-07-13 ENCOUNTER — Other Ambulatory Visit: Payer: Self-pay

## 2019-07-13 DIAGNOSIS — I10 Essential (primary) hypertension: Secondary | ICD-10-CM

## 2019-07-13 MED ORDER — TELMISARTAN 20 MG PO TABS: 20.0000 mg | ORAL_TABLET | Freq: Every day | ORAL | 0 refills | Status: DC

## 2019-07-13 NOTE — Telephone Encounter (Signed)
She does not need to be seen today unless her headache is severe,  Se needs to start taking telmisartan 20 mg daily In the evening.  If BP is not below 140/80 in one week,  Increase dose to 40 mg  In the evening and let me know.    Needs a bmet next week.  The lasix will not help the swelling in her knee if it is from bursitis .  She may need to see an orthopedist.

## 2019-07-13 NOTE — Telephone Encounter (Signed)
No availability on the schedule for any provider in the office today.

## 2019-07-13 NOTE — Telephone Encounter (Signed)
Start the telmisartan regardless.  Only stop if BP falls to  < 110/60

## 2019-07-13 NOTE — Telephone Encounter (Signed)
Pt reports BP trending up past 3 weeks. States initially 130's, Monday 156/70, this AM 182/79. During call 177/86-HR 57.  Pt reports headache, 7/10, just took tylenol. Denies dizziness, no visual changes. Also reports BP medications changed 9/15 due to NA low, taking Lasix as ordered. States increased right knee pain, H/O, "Which may ne driving my BP up." Also reports  The lasix is not helping much with the swelling of my right knee."  Pt questioning if medication adjustment needed. Assured pt TN would route to practice for Dr. Lupita Dawn review. Agreeable to virtual appt if needed.Care advise given per protocol, pt verbalizes understanding. Please advise:  CB# [phone]  Reason for Disposition . Systolic BP  >= 99991111 OR Diastolic >= A999333  Answer Assessment - Initial Assessment Questions 1. BLOOD PRESSURE: "What is the blood pressure?" "Did you take at least two measurements 5 minutes apart?"     182/79 2. ONSET: "When did you take your blood pressure?"    This AM 3. W: "How did you obtain the blood pressure?" (e.g., visiting nurse, automatic home BP monitor)     Home monitor arm cuff 4. HISTORY: "Do you have a history of high blood pressure?"     Yes, meds changed, NA low 5. MEDICATIONS: "Are you taking any medications for blood pressure?" "Have you missed any doses recently?"     Yes, lasix 6. OTHER SYMPTOMS: "Do you have any symptoms?" (e.g., headache, chest pain, blurred vision, difficulty breathing, weakness)     Headache, right knee pain "May be causing BP to go up."  Protocols used: HIGH BLOOD PRESSURE-A-AH

## 2019-07-13 NOTE — Telephone Encounter (Signed)
After taking tylenol blood pressure came down to 156/70 and then an hour after that it was 139/72. Would you like pt to start the Telmisartan 20mg  tonight or keep monitoring the BP.

## 2019-07-13 NOTE — Addendum Note (Signed)
Addended by: Crecencio Mc on: 07/13/2019 12:40 PM   Modules accepted: Orders

## 2019-07-13 NOTE — Telephone Encounter (Signed)
Spoke with pt and informed her that she needs to start the Telmisartan regardless and then to stop it if her bp drops below 110/60. Pt gave a verbal understanding.

## 2019-09-15 DIAGNOSIS — G4733 Obstructive sleep apnea (adult) (pediatric): Secondary | ICD-10-CM | POA: Diagnosis not present

## 2019-09-16 DIAGNOSIS — Z1152 Encounter for screening for COVID-19: Secondary | ICD-10-CM | POA: Diagnosis not present

## 2019-09-16 DIAGNOSIS — G4489 Other headache syndrome: Secondary | ICD-10-CM | POA: Diagnosis not present

## 2019-11-21 ENCOUNTER — Other Ambulatory Visit: Payer: Self-pay | Admitting: Internal Medicine

## 2019-12-15 DIAGNOSIS — G4733 Obstructive sleep apnea (adult) (pediatric): Secondary | ICD-10-CM | POA: Diagnosis not present

## 2020-03-16 DIAGNOSIS — G4733 Obstructive sleep apnea (adult) (pediatric): Secondary | ICD-10-CM | POA: Diagnosis not present

## 2020-04-24 ENCOUNTER — Telehealth: Payer: Self-pay | Admitting: Internal Medicine

## 2020-04-24 ENCOUNTER — Other Ambulatory Visit: Payer: Self-pay | Admitting: Internal Medicine

## 2020-04-24 MED ORDER — TELMISARTAN 20 MG PO TABS: 20.0000 mg | ORAL_TABLET | Freq: Every day | ORAL | 0 refills | Status: DC

## 2020-04-24 NOTE — Telephone Encounter (Signed)
Patient is requesting a refill on her bp medication, telmisartan (MICARDIS) 20 MG tablet. Patient has an up coming appt with Dr. Derrel Nip

## 2020-04-26 ENCOUNTER — Encounter: Payer: Self-pay | Admitting: Internal Medicine

## 2020-04-26 ENCOUNTER — Telehealth (INDEPENDENT_AMBULATORY_CARE_PROVIDER_SITE_OTHER): Payer: 59 | Admitting: Internal Medicine

## 2020-04-26 VITALS — BP 136/77 | HR 57 | Ht 64.0 in | Wt 232.0 lb

## 2020-04-26 DIAGNOSIS — G629 Polyneuropathy, unspecified: Secondary | ICD-10-CM | POA: Diagnosis not present

## 2020-04-26 DIAGNOSIS — R635 Abnormal weight gain: Secondary | ICD-10-CM | POA: Diagnosis not present

## 2020-04-26 DIAGNOSIS — R911 Solitary pulmonary nodule: Secondary | ICD-10-CM

## 2020-04-26 DIAGNOSIS — I480 Paroxysmal atrial fibrillation: Secondary | ICD-10-CM

## 2020-04-26 DIAGNOSIS — M79604 Pain in right leg: Secondary | ICD-10-CM

## 2020-04-26 DIAGNOSIS — M25561 Pain in right knee: Secondary | ICD-10-CM | POA: Diagnosis not present

## 2020-04-26 DIAGNOSIS — E538 Deficiency of other specified B group vitamins: Secondary | ICD-10-CM | POA: Diagnosis not present

## 2020-04-26 DIAGNOSIS — I10 Essential (primary) hypertension: Secondary | ICD-10-CM

## 2020-04-26 DIAGNOSIS — M130 Polyarthritis, unspecified: Secondary | ICD-10-CM | POA: Diagnosis not present

## 2020-04-26 DIAGNOSIS — E785 Hyperlipidemia, unspecified: Secondary | ICD-10-CM

## 2020-04-26 MED ORDER — TRAMADOL HCL 50 MG PO TABS: 50.0000 mg | ORAL_TABLET | Freq: Four times a day (QID) | ORAL | 0 refills | Status: AC | PRN

## 2020-04-26 NOTE — Progress Notes (Addendum)
Virtual Visit via [employer]  This visit type was conducted due to national recommendations for restrictions regarding the COVID-19 pandemic (e.g. social distancing).  This format is felt to be most appropriate for this patient at this time.  All issues noted in this document were discussed and addressed.  No physical exam was performed (except for noted visual exam findings with Video Visits).   I connected with@ on 04/26/20 at  8:00 AM EDT by a video enabled telemedicine application and verified that I am speaking with the correct person using two identifiers. Location patient: home Location provider: work or home office Persons participating in the virtual visit: patient, provider  I discussed the limitations, risks, security and privacy concerns of performing an evaluation and management service by telephone and the availability of in person appointments. I also discussed with the patient that there may be a patient responsible charge related to this service. The patient expressed understanding and agreed to proceed.  Reason for visit: FOLLOW UP on chronic issues   HPI:  59 yr old white female with history of hypertension , PAF, obesity with OSA and hepatic steatosis last seen In Sept 2020.  RIGHT LEG FEELS HEAVY GOING UP STAIRS.  Some pain behind the knee.  Not giving way .  No swelling or calf pain .   WEIGHT GAIN  TAKING MOTRIN 800 MG BID for joint pain    FATTY LIVER diagnosed reviewed and DISCUSSED  ORTHoPEDIC REFERRAL TO [facility]   ROS: See pertinent positives and negatives per HPI.  Past Medical History:  Diagnosis Date  . Arthritis    left shoulder, injected by Margaretmary Eddy 2012  . Complication of anesthesia    Sats dropped during cataract surgery  . Hyperlipidemia    hypertriglyceridemia  . Hypertension   . Menopause, premature    at age 63  . Obesity (BMI 30-39.9)   . PAF (paroxysmal atrial fibrillation) (HCC)    R/T low potassium  . Sleep apnea    CPAP     Past Surgical History:  Procedure Laterality Date  . CATARACT EXTRACTION W/ INTRAOCULAR LENS  IMPLANT, BILATERAL    . CHOLECYSTECTOMY  1982  . COLONOSCOPY WITH PROPOFOL N/A 04/06/2017   Procedure: COLONOSCOPY WITH PROPOFOL;  Surgeon: Lucilla Lame, MD;  Location: [facility];  Service: Gastroenterology;  Laterality: N/A;  sleep apnea  . FOOT SURGERY Right    tumor  . herniated disc    . TUBAL LIGATION      Family History  Problem Relation Age of Onset  . Cancer Mother   . Alcohol abuse Mother   . Cancer Father   . COPD Father   . Alcohol abuse Father   . Heart disease Father   . Hypertension Father   . Hyperlipidemia Father   . Early death Sister   . COPD Sister   . Heart disease Sister   . Breast cancer Neg Hx     SOCIAL HX: . reports that she has never smoked. She has never used smokeless tobacco. She reports that she does not drink alcohol and does not use drugs.   Current Outpatient Medications:  .  aspirin 81 MG tablet, Take 81 mg by mouth daily., Disp: , Rfl:  .  DANDELION PO, Take by mouth daily., Disp: , Rfl:  .  MILK THISTLE PO, Take by mouth daily., Disp: , Rfl:  .  telmisartan (MICARDIS) 20 MG tablet, Take 1 tablet (20 mg total) by mouth daily., Disp: 90 tablet, Rfl:  0 .  TURMERIC PO, Take by mouth daily., Disp: , Rfl:  .  VITAMIN D PO, Take by mouth., Disp: , Rfl:   EXAM:  VITALS per patient if applicable:  GENERAL: alert, oriented, appears well and in no acute distress  HEENT: atraumatic, conjunttiva clear, no obvious abnormalities on inspection of external nose and ears  NECK: normal movements of the head and neck  LUNGS: on inspection no signs of respiratory distress, breathing rate appears normal, no obvious gross SOB, gasping or wheezing  CV: no obvious cyanosis  MS: moves all visible extremities without noticeable abnormality  PSYCH/NEURO: pleasant and cooperative, no obvious depression or anxiety, speech and thought processing  grossly intact  ASSESSMENT AND PLAN:  Discussed the following assessment and plan:  Posterior right knee pain - Plan: Ambulatory referral to Orthopedic Surgery  Hyperlipidemia with target LDL less than 100 - Plan: Lipid panel  Essential hypertension - Plan: Comprehensive metabolic panel  Weight gain - Plan: TSH, Hemoglobin A1c  Neuropathy  B12 deficiency  Morbid obesity (HCC)  Polyarthritis  Paroxysmal atrial fibrillation (HCC)  Right leg pain  Pulmonary nodule, right  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 Patient has not been supplementing consistently since diagnosis.     Lab Results  Component Value Date   VITAMINB12 117 (L) 05/03/2019    Lab Results  Component Value Date   HGBA1C 5.5 11/01/2018   Lab Results  Component Value Date   TSH 2.52 05/03/2019     B12 deficiency Found last year during workup for neuropathy,  Intrinsic factor negative.  Did not finish the IM injections and has not taken any oral supplements.  Reviewed the long term consequences of B12 deficiency.  Lab Results  Component Value Date   VITAMINB12 117 (L) 05/03/2019     Hypertension Well controlled on telmisartan . Renal function stable, no changes today.  Morbid obesity ([place]) With OSA and hepatic steatosis  . Not exercising due to right leg pain and working 2 jobs.  [employer] diet recommended  Polyarthritis She is using excessive amounts of NSAIDs for  History of fatty liver.  Tramadol prescribed and tylenol 2000 mg daily   Paroxysmal atrial fibrillation She has had no recent episodes of arrhythmia.   Right leg pain She has known minial DJD of the knee by prior films from 2020,  And now reports leg heaviness with climbing stairs and pain behind the knee. 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  Refer to Barrie Lyme   Pulmonary nodule, right 3 mm,  First seen in 2013,  Stable by 2016 imaging during cardiac CT     I discussed the assessment and treatment plan with  the patient. The patient was provided an opportunity to ask questions and all were answered. The patient agreed with the plan and demonstrated an understanding of the instructions.   The patient was advised to call back or seek an in-person evaluation if the symptoms worsen or if the condition fails to improve as anticipated.  I provided 30 minutes of face-to-face time during this encounter.   Crecencio Mc, MD

## 2020-04-26 NOTE — Patient Instructions (Signed)
START A B12 SUPPLEMENT ASAP!   2500 MCG DAILY  Reduce your ibuprofen to 200 mg twice daily   You can take  up to 2000 mg of acetominophen (tylenol) every day safely  In divided doses (500 mg every 6 hours  Or 1000 mg every 12 hours.)  Add tramadol if needed   Referral to Barrie Lyme in process   Fasting labs needed on Wednesday   optavia diet

## 2020-04-28 DIAGNOSIS — E538 Deficiency of other specified B group vitamins: Secondary | ICD-10-CM | POA: Insufficient documentation

## 2020-04-28 DIAGNOSIS — M79604 Pain in right leg: Secondary | ICD-10-CM | POA: Insufficient documentation

## 2020-04-28 NOTE — Assessment & Plan Note (Signed)
Found last year during workup for neuropathy,  Intrinsic factor negative.  Did not finish the IM injections and has not taken any oral supplements.  Reviewed the long term consequences of B12 deficiency.  Lab Results  Component Value Date   VITAMINB12 117 (L) 05/03/2019

## 2020-04-28 NOTE — Assessment & Plan Note (Signed)
3 mm,  First seen in 2013,  Stable by 2016 imaging during cardiac CT

## 2020-04-28 NOTE — Assessment & Plan Note (Signed)
She is using excessive amounts of NSAIDs for  History of fatty liver.  Tramadol prescribed and tylenol 2000 mg daily

## 2020-04-28 NOTE — Assessment & Plan Note (Signed)
She has had no recent episodes of arrhythmia.

## 2020-04-28 NOTE — Assessment & Plan Note (Addendum)
She has known minial DJD of the knee by prior films from 2020,  And now reports leg heaviness with climbing stairs and pain behind the knee. 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  Refer to [employer]

## 2020-04-28 NOTE — Assessment & Plan Note (Addendum)
Secondary to b12 deficiency. Patient has not been supplementing consistently since diagnosis.     Lab Results  Component Value Date   VITAMINB12 117 (L) 05/03/2019    Lab Results  Component Value Date   HGBA1C 5.5 11/01/2018   Lab Results  Component Value Date   TSH 2.52 05/03/2019

## 2020-04-28 NOTE — Assessment & Plan Note (Signed)
With OSA and hepatic steatosis  . Not exercising due to right leg pain and working 2 jobs.  [employer] diet recommended

## 2020-04-28 NOTE — Assessment & Plan Note (Signed)
Well controlled on telmisartan . Renal function stable, no changes today.

## 2020-05-04 DIAGNOSIS — M7061 Trochanteric bursitis, right hip: Secondary | ICD-10-CM | POA: Diagnosis not present

## 2020-05-04 DIAGNOSIS — M1711 Unilateral primary osteoarthritis, right knee: Secondary | ICD-10-CM | POA: Diagnosis not present

## 2020-05-09 ENCOUNTER — Other Ambulatory Visit: Payer: Self-pay

## 2020-05-09 ENCOUNTER — Other Ambulatory Visit (INDEPENDENT_AMBULATORY_CARE_PROVIDER_SITE_OTHER): Payer: 59

## 2020-05-09 ENCOUNTER — Other Ambulatory Visit: Payer: 59

## 2020-05-09 DIAGNOSIS — E785 Hyperlipidemia, unspecified: Secondary | ICD-10-CM | POA: Diagnosis not present

## 2020-05-09 DIAGNOSIS — R635 Abnormal weight gain: Secondary | ICD-10-CM | POA: Diagnosis not present

## 2020-05-09 DIAGNOSIS — I10 Essential (primary) hypertension: Secondary | ICD-10-CM

## 2020-05-09 LAB — BASIC METABOLIC PANEL
BUN: 13 mg/dL (ref 6–23)
CO2: 32 mEq/L (ref 19–32)
Calcium: 8.7 mg/dL (ref 8.4–10.5)
Chloride: 101 mEq/L (ref 96–112)
Creatinine, Ser: 0.77 mg/dL (ref 0.40–1.20)
GFR: 76.51 mL/min (ref >60.00)
Glucose, Bld: 99 mg/dL (ref 70–99)
Potassium: 3.9 mEq/L (ref 3.5–5.1)
Sodium: 139 mEq/L (ref 135–145)

## 2020-05-09 LAB — COMPREHENSIVE METABOLIC PANEL
ALT: 38 U/L — ABNORMAL HIGH (ref 0–35)
AST: 28 U/L (ref 0–37)
Albumin: 4.1 g/dL (ref 3.5–5.2)
Alkaline Phosphatase: 78 U/L (ref 39–117)
BUN: 13 mg/dL (ref 6–23)
CO2: 32 mEq/L (ref 19–32)
Calcium: 8.7 mg/dL (ref 8.4–10.5)
Chloride: 101 mEq/L (ref 96–112)
Creatinine, Ser: 0.77 mg/dL (ref 0.40–1.20)
GFR: 76.51 mL/min (ref >60.00)
Glucose, Bld: 99 mg/dL (ref 70–99)
Potassium: 3.9 mEq/L (ref 3.5–5.1)
Sodium: 139 mEq/L (ref 135–145)
Total Bilirubin: 0.7 mg/dL (ref 0.2–1.2)
Total Protein: 6.5 g/dL (ref 6.0–8.3)

## 2020-05-09 LAB — HEMOGLOBIN A1C: Hgb A1c MFr Bld: 5.7 % (ref 4.6–6.5)

## 2020-05-09 LAB — LIPID PANEL
Cholesterol: 126 mg/dL (ref 0–200)
HDL: 44 mg/dL (ref >39.00)
LDL Cholesterol: 63 mg/dL (ref 0–99)
NonHDL: 82.23
Total CHOL/HDL Ratio: 3
Triglycerides: 98 mg/dL (ref 0.0–149.0)
VLDL: 19.6 mg/dL (ref 0.0–40.0)

## 2020-05-09 LAB — TSH: TSH: 2.21 u[IU]/mL (ref 0.35–4.50)

## 2020-05-12 NOTE — Progress Notes (Signed)
your labs are normal, except for 1) your liver enzyme is still mildly elevated,  and 2) your A1c is now "prediabetic." Given your history of fatty liver , I recommend starting a medication called metformin , which will address both metabolic issues and  help curb your appetite , and  a low glycemic index diet .  If you would like to discuss this please schedule a follow up appointment at your convenience.

## 2020-05-17 DIAGNOSIS — M1711 Unilateral primary osteoarthritis, right knee: Secondary | ICD-10-CM | POA: Diagnosis not present

## 2020-05-17 DIAGNOSIS — M25561 Pain in right knee: Secondary | ICD-10-CM | POA: Diagnosis not present

## 2020-06-15 DIAGNOSIS — G4733 Obstructive sleep apnea (adult) (pediatric): Secondary | ICD-10-CM | POA: Diagnosis not present

## 2020-08-27 ENCOUNTER — Other Ambulatory Visit: Payer: Self-pay

## 2020-08-27 ENCOUNTER — Other Ambulatory Visit: Payer: Self-pay | Admitting: Internal Medicine

## 2020-08-27 MED ORDER — TELMISARTAN 20 MG PO TABS: 20.0000 mg | ORAL_TABLET | Freq: Every day | ORAL | 0 refills | Status: DC

## 2020-09-13 DIAGNOSIS — G4733 Obstructive sleep apnea (adult) (pediatric): Secondary | ICD-10-CM | POA: Diagnosis not present

## 2020-09-17 DIAGNOSIS — M1711 Unilateral primary osteoarthritis, right knee: Secondary | ICD-10-CM | POA: Diagnosis not present

## 2020-09-17 DIAGNOSIS — M25561 Pain in right knee: Secondary | ICD-10-CM | POA: Diagnosis not present

## 2020-12-13 ENCOUNTER — Other Ambulatory Visit: Payer: Self-pay

## 2020-12-13 DIAGNOSIS — M7542 Impingement syndrome of left shoulder: Secondary | ICD-10-CM | POA: Diagnosis not present

## 2020-12-13 DIAGNOSIS — M1712 Unilateral primary osteoarthritis, left knee: Secondary | ICD-10-CM | POA: Diagnosis not present

## 2020-12-13 DIAGNOSIS — G4733 Obstructive sleep apnea (adult) (pediatric): Secondary | ICD-10-CM | POA: Diagnosis not present

## 2020-12-13 MED ORDER — CYCLOBENZAPRINE HCL 5 MG PO TABS
ORAL_TABLET | ORAL | 0 refills | Status: DC
  Filled 2020-12-13: qty 30, 30d supply, fill #0

## 2021-01-27 ENCOUNTER — Other Ambulatory Visit: Payer: Self-pay | Admitting: Internal Medicine

## 2021-01-28 ENCOUNTER — Other Ambulatory Visit: Payer: Self-pay

## 2021-01-28 MED ORDER — TELMISARTAN 20 MG PO TABS
ORAL_TABLET | Freq: Every day | ORAL | 0 refills | Status: DC
  Filled 2021-01-28: qty 90, 90d supply, fill #0

## 2021-02-25 ENCOUNTER — Other Ambulatory Visit: Payer: Self-pay

## 2021-02-25 DIAGNOSIS — M1712 Unilateral primary osteoarthritis, left knee: Secondary | ICD-10-CM | POA: Diagnosis not present

## 2021-02-25 MED ORDER — MELOXICAM 15 MG PO TABS
ORAL_TABLET | ORAL | 2 refills | Status: DC
  Filled 2021-02-25 – 2021-04-04 (×2): qty 30, 30d supply, fill #0
  Filled 2021-05-15: qty 30, 30d supply, fill #1
  Filled 2021-06-27 (×2): qty 30, 30d supply, fill #2

## 2021-03-08 ENCOUNTER — Ambulatory Visit: Payer: 59 | Admitting: Internal Medicine

## 2021-03-11 ENCOUNTER — Other Ambulatory Visit: Payer: Self-pay

## 2021-03-15 DIAGNOSIS — G4733 Obstructive sleep apnea (adult) (pediatric): Secondary | ICD-10-CM | POA: Diagnosis not present

## 2021-04-03 ENCOUNTER — Other Ambulatory Visit: Payer: Self-pay

## 2021-04-03 DIAGNOSIS — M1711 Unilateral primary osteoarthritis, right knee: Secondary | ICD-10-CM | POA: Diagnosis not present

## 2021-04-03 DIAGNOSIS — S86911A Strain of unspecified muscle(s) and tendon(s) at lower leg level, right leg, initial encounter: Secondary | ICD-10-CM | POA: Diagnosis not present

## 2021-04-03 DIAGNOSIS — M1712 Unilateral primary osteoarthritis, left knee: Secondary | ICD-10-CM | POA: Diagnosis not present

## 2021-04-03 MED ORDER — METHYLPREDNISOLONE 4 MG PO TBPK
ORAL_TABLET | ORAL | 0 refills | Status: DC
  Filled 2021-04-03: qty 21, 6d supply, fill #0

## 2021-04-04 ENCOUNTER — Other Ambulatory Visit: Payer: Self-pay

## 2021-04-10 ENCOUNTER — Other Ambulatory Visit: Payer: Self-pay

## 2021-04-10 ENCOUNTER — Ambulatory Visit: Payer: 59 | Admitting: Internal Medicine

## 2021-04-10 ENCOUNTER — Encounter: Payer: Self-pay | Admitting: Internal Medicine

## 2021-04-10 VITALS — BP 136/74 | HR 72 | Temp 95.9°F | Resp 15 | Ht 64.0 in | Wt 243.0 lb

## 2021-04-10 DIAGNOSIS — R635 Abnormal weight gain: Secondary | ICD-10-CM | POA: Diagnosis not present

## 2021-04-10 DIAGNOSIS — K760 Fatty (change of) liver, not elsewhere classified: Secondary | ICD-10-CM

## 2021-04-10 DIAGNOSIS — E785 Hyperlipidemia, unspecified: Secondary | ICD-10-CM | POA: Diagnosis not present

## 2021-04-10 MED ORDER — OZEMPIC (0.25 OR 0.5 MG/DOSE) 2 MG/1.5ML ~~LOC~~ SOPN: 0.2500 mg | PEN_INJECTOR | SUBCUTANEOUS | 0 refills | Status: DC

## 2021-04-10 NOTE — Progress Notes (Signed)
Subjective:  Patient ID: Jessica Clayton, female    DOB: 18-Sep-1960  Age: 60 y.o. MRN: [id number]  CC: The primary encounter diagnosis was Weight gain. Diagnoses of Hepatic steatosis, Morbid obesity ([place]), and Hyperlipidemia with target LDL less than 100 were also pertinent to this visit.  HPI Jessica Clayton presents for follow up   This visit occurred during the SARS-CoV-2 public health emergency.  Safety protocols were in place, including screening questions prior to the visit, additional usage of staff PPE, and extensive cleaning of exam room while observing appropriate contact time as indicated for disinfecting solutions.    Jessica Clayton is frustrated  .and concerned about her general health.  "I'm falling apart."   Right knee  pain has improved,  but now Left knee is  problematic and painful,  spurs noted on recent imaging by Orthopedics.    Had an I/A   Injection which did not help,  could not walk last week due to pain with weight bearing.  No MRI done yet.  Finished prednisone yesterday and PT to start tomorrow.  WEARING A BRACE SINCE last Thursday .  Has no pain unless weight bearing . Taking 650 mg tylenol tid.  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 Still taking care of a widow   Obesity:  weight gain discussed.  Tried the [employer] for 3 weeks.  Stopped the diet because she did not see any results.     Outpatient Medications Prior to Visit  Medication Sig Dispense Refill   aspirin 81 MG tablet Take 81 mg by mouth daily.     cyclobenzaprine (FLEXERIL) 5 MG tablet take 1 tablet by mouth at bedtime as needed 30 tablet 0   DANDELION PO Take by mouth daily.     MILK THISTLE PO Take by mouth daily.     telmisartan (MICARDIS) 20 MG tablet TAKE 1 TABLET (20 MG TOTAL) BY MOUTH DAILY. 90 tablet 0   TURMERIC PO Take by mouth daily.     VITAMIN D PO Take by mouth.     meloxicam (MOBIC) 15 MG tablet Take 1 tablet every day by oral route. (Patient not taking: Reported on  04/10/2021) 30 tablet 2   methylPREDNISolone (MEDROL) 4 MG TBPK tablet Take 1 dose pk by oral route. (Patient not taking: Reported on 04/10/2021) 21 tablet 0   No facility-administered medications prior to visit.    Review of Systems;  Patient denies headache, fevers, malaise, unintentional weight loss, skin rash, eye pain, sinus congestion and sinus pain, sore throat, dysphagia,  hemoptysis , cough, dyspnea, wheezing, chest pain, palpitations, orthopnea, edema, abdominal pain, nausea, melena, diarrhea, constipation, flank pain, dysuria, hematuria, urinary  Frequency, nocturia, numbness, tingling, seizures,  Focal weakness, Loss of consciousness,  Tremor, insomnia, depression, anxiety, and suicidal ideation.      Objective:  BP 136/74 (BP Location: Left Arm, Patient Position: Sitting, Cuff Size: Large)   Pulse 72   Temp (!) 95.9 F (35.5 C) (Temporal)   Resp 15   Ht '5\' 4"'$  (1.626 m)   Wt 243 lb (110.2 kg)   SpO2 98%   BMI 41.71 kg/m   BP Readings from Last 3 Encounters:  04/10/21 136/74  04/26/20 136/77  04/29/19 130/62    Wt Readings from Last 3 Encounters:  04/10/21 243 lb (110.2 kg)  04/26/20 232 lb (105.2 kg)  04/29/19 227 lb (103 kg)    General appearance: alert, cooperative and appears stated age Ears: normal TM's and external ear  canals both ears Throat: lips, mucosa, and tongue normal; teeth and gums normal Neck: no adenopathy, no carotid bruit, supple, symmetrical, trachea midline and thyroid not enlarged, symmetric, no tenderness/mass/nodules Back: symmetric, no curvature. ROM normal. No CVA tenderness. Lungs: clear to auscultation bilaterally Heart: regular rate and rhythm, S1, S2 normal, no murmur, click, rub or gallop Abdomen: soft, non-tender; bowel sounds normal; no masses,  no organomegaly Pulses: 2+ and symmetric Skin: Skin color, texture, turgor normal. No rashes or lesions Lymph nodes: Cervical, supraclavicular, and axillary nodes normal.  Lab Results   Component Value Date   HGBA1C 5.7 04/11/2021   HGBA1C 5.7 05/09/2020   HGBA1C 5.5 11/01/2018    Lab Results  Component Value Date   CREATININE 0.84 04/11/2021   CREATININE 0.77 05/09/2020   CREATININE 0.77 05/09/2020    Lab Results  Component Value Date   WBC 5.4 05/08/2015   HGB 13.2 05/08/2015   HCT 40.0 05/08/2015   PLT 298.0 05/08/2015   GLUCOSE 93 04/11/2021   CHOL 168 04/11/2021   TRIG 114.0 04/11/2021   HDL 52.40 04/11/2021   LDLCALC 93 04/11/2021   ALT 28 04/11/2021   AST 23 04/11/2021   NA 137 04/11/2021   K 4.2 04/11/2021   CL 100 04/11/2021   CREATININE 0.84 04/11/2021   BUN 13 04/11/2021   CO2 28 04/11/2021   TSH 3.27 04/11/2021   HGBA1C 5.7 04/11/2021   MICROALBUR <0.7 11/01/2018    DG Shoulder Right  Result Date: 02/16/2019 CLINICAL DATA:  [place].  Right shoulder pain. EXAM: RIGHT SHOULDER - 2+ VIEW COMPARISON:  None. FINDINGS: Mild AC joint and glenohumeral joint degenerative changes. No acute fracture or dislocation. The visualized right ribs are intact and the visualized right lung is clear. IMPRESSION: No fracture or dislocation. Electronically Signed   By: Marijo Sanes M.D.   On: 02/16/2019 13:42   DG Knee Complete 4 Views Left  Result Date: 02/16/2019 CLINICAL DATA:  Fall 1 month ago with persistent pain, initial encounter EXAM: LEFT KNEE - COMPLETE 4+ VIEW COMPARISON:  08/09/16 FINDINGS: Tricompartmental degenerative changes noted. No acute fracture or dislocation is seen. No soft tissue abnormality is noted. IMPRESSION: Mild degenerative change without acute abnormality. Electronically Signed   By: Inez Catalina M.D.   On: 02/16/2019 13:44   DG Knee Complete 4 Views Right  Result Date: 02/16/2019 CLINICAL DATA:  Right knee pain after fall 4 weeks ago. EXAM: RIGHT KNEE - COMPLETE 4+ VIEW COMPARISON:  None. FINDINGS: No evidence of fracture, dislocation, or joint effusion. Mild narrowing of the medial and lateral joint spaces is noted. Soft tissues are  unremarkable. IMPRESSION: Mild degenerative joint disease. No acute abnormality seen in the right knee. Electronically Signed   By: Marijo Conception M.D.   On: 02/16/2019 13:46    Assessment & Plan:   Problem List Items Addressed This Visit       Unprioritized   Hepatic steatosis    Presumed by ultrasound changes and serologies negative for autoimmune causes of hepatitis.   all modifiable risk factors including obesity and hyperlipidemia have been addressed with medications and/or advice to follow a low glycemic diet.  She has received the Hepatitis A/B vaccine series . Will recommend Ozempic for management of obesity,  Lab Results  Component Value Date   ALT 28 04/11/2021   AST 23 04/11/2021   ALKPHOS 75 04/11/2021   BILITOT 0.7 04/11/2021         Relevant Orders   Comprehensive metabolic  panel (Completed)   Lipid panel (Completed)   Hyperlipidemia with target LDL less than 100    Her cholesterol remains low with  dietary changes.  No medication indicated.   Lab Results  Component Value Date   CHOL 168 04/11/2021   HDL 52.40 04/11/2021   LDLCALC 93 04/11/2021   TRIG 114.0 04/11/2021   CHOLHDL 3 04/11/2021         Morbid obesity ([place])    With fatty liver,  Prediabetes, and DJD knees aggravated by weight gain.  counselling given . Recommending initiation of pharmacotherapy with  Ozempic.  Samples given and instruction given.   Lab Results  Component Value Date   HGBA1C 5.7 04/11/2021  .       Relevant Medications   Semaglutide,0.25 or 0.'5MG'$ /DOS, (OZEMPIC, 0.25 OR 0.5 MG/DOSE,) 2 MG/1.5ML SOPN   Other Visit Diagnoses     Weight gain    -  Primary   Relevant Orders   Hemoglobin A1c (Completed)   TSH (Completed)      Meds ordered this encounter  Medications   Semaglutide,0.25 or 0.'5MG'$ /DOS, (OZEMPIC, 0.25 OR 0.5 MG/DOSE,) 2 MG/1.5ML SOPN    Sig: Inject 0.25 mg into the skin once a week.    Dispense:  1.5 mL    Refill:  0    Medications Discontinued  During This Encounter  Medication Reason   methylPREDNISolone (MEDROL) 4 MG TBPK tablet Completed Course   I spent 30 minutes dedicated to the care of this patient on the date of this encounter to include pre-visit review of her medical history,  Face-to-face time with the patient , counselling on her weight problem and joint pain., and post visit ordering of testing and therapeutics.   Follow-up: No follow-ups on file.   Crecencio Mc, MD

## 2021-04-10 NOTE — Patient Instructions (Signed)
I am prescribing Ozempic .  This medication is beneficial for weight loss, prediabetes and fatty liver   and have given you a sample pen that contains 6  WEEKLY doses at the starting dose of 0.25 mg     ozempic is a medication that is taken as a weekly subcutaneous injection. It is not insulin.  It  causes your pancreas to increase its  own insulin secretion  And also slows down the emptying of your stomach,  So it decreases your appetite and helps you lose weight.  The dose for the first 4 weekly doses is 0.25 mg.  You may have mild nausea on the first or second day but this should resolve.  If not  ,  stop the medication.   As long as you are losing weight,  you can continue the dose you are on .  Only increase the dose to 0.5 mg after 4 weeks if your weight has plateaued.  Let me know when you need a refill and what dose you are taking.   It is covered by your insurance    Your fasting labs appointment is tomorrow at [facility]

## 2021-04-11 ENCOUNTER — Other Ambulatory Visit (INDEPENDENT_AMBULATORY_CARE_PROVIDER_SITE_OTHER): Payer: 59

## 2021-04-11 DIAGNOSIS — K760 Fatty (change of) liver, not elsewhere classified: Secondary | ICD-10-CM

## 2021-04-11 DIAGNOSIS — M25562 Pain in left knee: Secondary | ICD-10-CM | POA: Diagnosis not present

## 2021-04-11 DIAGNOSIS — R635 Abnormal weight gain: Secondary | ICD-10-CM | POA: Diagnosis not present

## 2021-04-11 LAB — COMPREHENSIVE METABOLIC PANEL
ALT: 28 U/L (ref 0–35)
AST: 23 U/L (ref 0–37)
Albumin: 4.1 g/dL (ref 3.5–5.2)
Alkaline Phosphatase: 75 U/L (ref 39–117)
BUN: 13 mg/dL (ref 6–23)
CO2: 28 mEq/L (ref 19–32)
Calcium: 9.2 mg/dL (ref 8.4–10.5)
Chloride: 100 mEq/L (ref 96–112)
Creatinine, Ser: 0.84 mg/dL (ref 0.40–1.20)
GFR: 75.36 mL/min (ref >60.00)
Glucose, Bld: 93 mg/dL (ref 70–99)
Potassium: 4.2 mEq/L (ref 3.5–5.1)
Sodium: 137 mEq/L (ref 135–145)
Total Bilirubin: 0.7 mg/dL (ref 0.2–1.2)
Total Protein: 6.5 g/dL (ref 6.0–8.3)

## 2021-04-11 LAB — LIPID PANEL
Cholesterol: 168 mg/dL (ref 0–200)
HDL: 52.4 mg/dL (ref >39.00)
LDL Cholesterol: 93 mg/dL (ref 0–99)
NonHDL: 115.41
Total CHOL/HDL Ratio: 3
Triglycerides: 114 mg/dL (ref 0.0–149.0)
VLDL: 22.8 mg/dL (ref 0.0–40.0)

## 2021-04-11 LAB — HEMOGLOBIN A1C: Hgb A1c MFr Bld: 5.7 % (ref 4.6–6.5)

## 2021-04-11 LAB — TSH: TSH: 3.27 u[IU]/mL (ref 0.35–5.50)

## 2021-04-13 ENCOUNTER — Encounter: Payer: Self-pay | Admitting: Internal Medicine

## 2021-04-13 NOTE — Assessment & Plan Note (Signed)
Her cholesterol remains low with  dietary changes.  No medication indicated.   Lab Results  Component Value Date   CHOL 168 04/11/2021   HDL 52.40 04/11/2021   LDLCALC 93 04/11/2021   TRIG 114.0 04/11/2021   CHOLHDL 3 04/11/2021

## 2021-04-13 NOTE — Assessment & Plan Note (Signed)
Presumed by ultrasound changes and serologies negative for autoimmune causes of hepatitis.   all modifiable risk factors including obesity and hyperlipidemia have been addressed with medications and/or advice to follow a low glycemic diet.  She has received the Hepatitis A/B vaccine series . Will recommend Ozempic for management of obesity,  Lab Results  Component Value Date   ALT 28 04/11/2021   AST 23 04/11/2021   ALKPHOS 75 04/11/2021   BILITOT 0.7 04/11/2021

## 2021-04-13 NOTE — Assessment & Plan Note (Addendum)
With fatty liver,  Prediabetes, and DJD knees aggravated by weight gain.  counselling given . Recommending initiation of pharmacotherapy with  Ozempic.  Samples given and instruction given.   Lab Results  Component Value Date   HGBA1C 5.7 04/11/2021  .

## 2021-04-19 DIAGNOSIS — M25562 Pain in left knee: Secondary | ICD-10-CM | POA: Diagnosis not present

## 2021-04-23 DIAGNOSIS — M25562 Pain in left knee: Secondary | ICD-10-CM | POA: Diagnosis not present

## 2021-04-25 DIAGNOSIS — M25562 Pain in left knee: Secondary | ICD-10-CM | POA: Diagnosis not present

## 2021-04-30 DIAGNOSIS — M25562 Pain in left knee: Secondary | ICD-10-CM | POA: Diagnosis not present

## 2021-05-01 DIAGNOSIS — M1712 Unilateral primary osteoarthritis, left knee: Secondary | ICD-10-CM | POA: Diagnosis not present

## 2021-05-09 DIAGNOSIS — M25562 Pain in left knee: Secondary | ICD-10-CM | POA: Diagnosis not present

## 2021-05-09 DIAGNOSIS — M1712 Unilateral primary osteoarthritis, left knee: Secondary | ICD-10-CM | POA: Diagnosis not present

## 2021-05-12 ENCOUNTER — Other Ambulatory Visit: Payer: Self-pay

## 2021-05-12 ENCOUNTER — Other Ambulatory Visit: Payer: Self-pay | Admitting: Internal Medicine

## 2021-05-13 ENCOUNTER — Other Ambulatory Visit: Payer: Self-pay

## 2021-05-13 MED ORDER — OZEMPIC (0.25 OR 0.5 MG/DOSE) 2 MG/1.5ML ~~LOC~~ SOPN: 0.2500 mg | PEN_INJECTOR | SUBCUTANEOUS | 0 refills | Status: DC

## 2021-05-13 MED ORDER — TELMISARTAN 20 MG PO TABS
ORAL_TABLET | Freq: Every day | ORAL | 0 refills | Status: DC
  Filled 2021-05-13: qty 90, 90d supply, fill #0

## 2021-05-15 ENCOUNTER — Other Ambulatory Visit: Payer: Self-pay

## 2021-05-15 MED ORDER — OZEMPIC (0.25 OR 0.5 MG/DOSE) 2 MG/1.5ML ~~LOC~~ SOPN
0.5000 mg | PEN_INJECTOR | SUBCUTANEOUS | 1 refills | Status: DC
  Filled 2021-05-15: qty 1.5, 28d supply, fill #0
  Filled 2021-06-27: qty 1.5, 28d supply, fill #1

## 2021-05-16 DIAGNOSIS — M25562 Pain in left knee: Secondary | ICD-10-CM | POA: Diagnosis not present

## 2021-05-16 DIAGNOSIS — M1712 Unilateral primary osteoarthritis, left knee: Secondary | ICD-10-CM | POA: Diagnosis not present

## 2021-05-23 DIAGNOSIS — M25562 Pain in left knee: Secondary | ICD-10-CM | POA: Diagnosis not present

## 2021-05-23 DIAGNOSIS — M1712 Unilateral primary osteoarthritis, left knee: Secondary | ICD-10-CM | POA: Diagnosis not present

## 2021-06-13 DIAGNOSIS — G4733 Obstructive sleep apnea (adult) (pediatric): Secondary | ICD-10-CM | POA: Diagnosis not present

## 2021-06-14 ENCOUNTER — Telehealth: Payer: Self-pay | Admitting: Internal Medicine

## 2021-06-14 ENCOUNTER — Encounter: Payer: Self-pay | Admitting: Emergency Medicine

## 2021-06-14 ENCOUNTER — Ambulatory Visit
Admission: EM | Admit: 2021-06-14 | Discharge: 2021-06-14 | Disposition: A | Discharge status: Other Acute Inpatient Hospital | Payer: 59

## 2021-06-14 ENCOUNTER — Encounter (HOSPITAL_COMMUNITY): Payer: Self-pay | Admitting: Emergency Medicine

## 2021-06-14 ENCOUNTER — Other Ambulatory Visit: Payer: Self-pay

## 2021-06-14 ENCOUNTER — Emergency Department (HOSPITAL_COMMUNITY)
Admission: EM | Admit: 2021-06-14 | Discharge: 2021-06-15 | Disposition: A | Discharge status: Home / Self Care | Payer: 59 | Attending: Emergency Medicine | Admitting: Emergency Medicine

## 2021-06-14 DIAGNOSIS — R519 Headache, unspecified: Secondary | ICD-10-CM

## 2021-06-14 DIAGNOSIS — I10 Essential (primary) hypertension: Secondary | ICD-10-CM

## 2021-06-14 DIAGNOSIS — Z79899 Other long term (current) drug therapy: Secondary | ICD-10-CM | POA: Insufficient documentation

## 2021-06-14 DIAGNOSIS — Z7982 Long term (current) use of aspirin: Secondary | ICD-10-CM | POA: Insufficient documentation

## 2021-06-14 NOTE — Telephone Encounter (Signed)
Attempted to call pt. No answer no voicemail.  

## 2021-06-14 NOTE — Telephone Encounter (Signed)
Awaiting access nurse note

## 2021-06-14 NOTE — ED Triage Notes (Signed)
Pt here with 114 systolic pressure at 2am. It is still 180's and pt presents with headache.

## 2021-06-14 NOTE — Telephone Encounter (Signed)
Patient has been having headaches, she took her BP today and the highest was 205/68, pulse 56 at 7am, at 10am  155/79, pulse 65, 2pm today 186/82, pulse 50. Patient was transferred to Access Nurse. No available appts.

## 2021-06-14 NOTE — Discharge Instructions (Signed)
Your current condition warrants further evaluation and/or treatment which exceed services available to you in this urgent care setting. I have discussed with you your currrent condition and the need for further evaluation and/or treatment in an emergency department setting. In response to my medical recommendation, you have opted to go to the emergency department. 

## 2021-06-14 NOTE — ED Provider Notes (Addendum)
CHIEF COMPLAINT:   Chief Complaint  Patient presents with   Hypertension   Headache     SUBJECTIVE/HPI:  HPI A very pleasant 60 y.o.Female presents today with elevated blood pressure and headache.  Patient reports that she noticed her blood pressure was elevated this morning at about 2 AM.  Patient reports that despite taking her blood pressure medication her blood pressure has not been responsive.  Patient's blood pressure at 2 AM was 186/82 with a 50 heart rate, 2:15 PM was 184/69 with a 55 heart rate, 4 AM blood pressure was 189/83 with a 54 heart rate, at 7 AM her blood pressure was 205/68 with a 56 heart rate, 10 AM her blood pressure was 155/79 with a 65 heart rate, 2 PM her blood pressure was 171/74 with a 54 heart rate, at 4 PM today her blood pressure was 173/73 with a 59 heart rate. Patient does not report any shortness of breath, chest pain, palpitations, visual changes, weakness, tingling, nausea, vomiting, diarrhea, fever, chills.   has a past medical history of Arthritis, Chest pain (05/08/2015), Chest pain at rest (6/65/9935), Complication of anesthesia, Hyperlipidemia, Hypertension, Menopause, premature, Obesity (BMI 30-39.9), PAF (paroxysmal atrial fibrillation) ([place]), and Sleep apnea. ROS:  Review of Systems See Subjective/HPI Medications, Allergies and Problem List personally reviewed in Epic today OBJECTIVE:   Vitals:   06/14/21 1754  BP: (!) 184/76  Pulse: (!) 52  Resp: 16  Temp: 98.7 F (37.1 C)  SpO2: 97%    Physical Exam   General: Appears well-developed and well-nourished. No acute distress.  HEENT Head: Normocephalic and atraumatic.  Ears: Hearing grossly intact, no drainage or visible deformity.  Eyes: Conjunctivae and EOM are normal. No eye drainage or scleral icterus bilaterally.  Neck: Normal range of motion, neck is supple.  Cardiovascular: Normal rate . Regular rhythm; no murmurs, gallops, or rubs.  Pulm/Chest: No respiratory distress. Breath  sounds normal bilaterally without wheezes, rhonchi, or rales.  Neurological: Alert and oriented to person, place, and time.  Skin: Skin is warm and dry.  Psychiatric: Normal mood, affect, behavior, and thought content.   Vital signs and nursing note reviewed.   Patient stable and cooperative with examination.  LABS/X-RAYS/EKG/MEDS:   No results found for any visits on 06/14/21.  MEDICAL DECISION MAKING:   Patient presents with elevated blood pressure and headache.  Patient reports that she noticed her blood pressure was elevated this morning at about 2 AM.  Patient reports that despite taking her blood pressure medication her blood pressure has not been responsive.  Patient's blood pressure at 2 AM was 186/82 with a 50 heart rate, 2:15 PM was 184/69 with a 55 heart rate, 4 AM blood pressure was 189/83 with a 54 heart rate, at 7 AM her blood pressure was 205/68 with a 56 heart rate, 10 AM her blood pressure was 155/79 with a 65 heart rate, 2 PM her blood pressure was 171/74 with a 54 heart rate, at 4 PM today her blood pressure was 173/73 with a 59 heart rate. Patient does not report any shortness of breath, chest pain, palpitations, visual changes, weakness, tingling, nausea, vomiting, diarrhea, fever, chills.  Patient did bring a log of her blood pressures, attempted to upload via [place], but unable to save the photo to her chart today, blood pressures are written out above in order of recurrence.  Patient is in no acute distress at this time, but with having a headache along with a measured blood pressure in  clinic of 184/76 I do feel as if the patient would be best served in the emergency department for a more thorough evaluation.  Patient reports that she is not on a blood thinning medication, but does report taking an 81 mg aspirin daily.  She does not report any red flag signs today for stroke or ACS.  Patient will present to the emergency department via private vehicle.  Stable on discharge.   Return as needed.  ASSESSMENT/PLAN:  1. Elevated blood pressure reading with diagnosis of hypertension  2. Acute nonintractable headache, unspecified headache type    Plan:   Discharge Instructions      Your current condition warrants further evaluation and/or treatment which exceed services available to you in this urgent care setting. I have discussed with you your currrent condition and the need for further evaluation and/or treatment in an emergency department setting. In response to my medical recommendation, you have opted to go to the emergency department.          [name], [name] 06/14/21 [place]    [name], [name] 06/14/21 4045

## 2021-06-14 NOTE — ED Triage Notes (Signed)
Here for high bp.  B/p has beenin the 180's sys up to 200's.  C/o headache.

## 2021-06-15 ENCOUNTER — Emergency Department (HOSPITAL_COMMUNITY): Payer: 59

## 2021-06-15 DIAGNOSIS — Z7982 Long term (current) use of aspirin: Secondary | ICD-10-CM | POA: Diagnosis not present

## 2021-06-15 DIAGNOSIS — R519 Headache, unspecified: Secondary | ICD-10-CM | POA: Diagnosis not present

## 2021-06-15 DIAGNOSIS — I10 Essential (primary) hypertension: Secondary | ICD-10-CM | POA: Diagnosis not present

## 2021-06-15 DIAGNOSIS — Z79899 Other long term (current) drug therapy: Secondary | ICD-10-CM | POA: Diagnosis not present

## 2021-06-15 LAB — CBC WITH DIFFERENTIAL/PLATELET
Abs Immature Granulocytes: 0.01 10*3/uL (ref 0.00–0.07)
Basophils Absolute: 0.1 10*3/uL (ref 0.0–0.1)
Basophils Relative: 1 %
Eosinophils Absolute: 0.2 10*3/uL (ref 0.0–0.5)
Eosinophils Relative: 4 %
HCT: 39 % (ref 36.0–46.0)
Hemoglobin: 13 g/dL (ref 12.0–15.0)
Immature Granulocytes: 0 %
Lymphocytes Relative: 33 %
Lymphs Abs: 2 10*3/uL (ref 0.7–4.0)
MCH: 29.5 pg (ref 26.0–34.0)
MCHC: 33.3 g/dL (ref 30.0–36.0)
MCV: 88.4 fL (ref 80.0–100.0)
Monocytes Absolute: 0.5 10*3/uL (ref 0.1–1.0)
Monocytes Relative: 8 %
Neutro Abs: 3.4 10*3/uL (ref 1.7–7.7)
Neutrophils Relative %: 54 %
Platelets: 280 10*3/uL (ref 150–400)
RBC: 4.41 MIL/uL (ref 3.87–5.11)
RDW: 13.2 % (ref 11.5–15.5)
WBC: 6.1 10*3/uL (ref 4.0–10.5)
nRBC: 0 % (ref 0.0–0.2)

## 2021-06-15 LAB — COMPREHENSIVE METABOLIC PANEL
ALT: 25 U/L (ref 0–44)
AST: 21 U/L (ref 15–41)
Albumin: 4.2 g/dL (ref 3.5–5.0)
Alkaline Phosphatase: 66 U/L (ref 38–126)
Anion gap: 8 (ref 5–15)
BUN: 8 mg/dL (ref 6–20)
CO2: 26 mmol/L (ref 22–32)
Calcium: 8.8 mg/dL — ABNORMAL LOW (ref 8.9–10.3)
Chloride: 102 mmol/L (ref 98–111)
Creatinine, Ser: 0.67 mg/dL (ref 0.44–1.00)
GFR, Estimated: >60 mL/min (ref >60)
Glucose, Bld: 96 mg/dL (ref 70–99)
Potassium: 3.4 mmol/L — ABNORMAL LOW (ref 3.5–5.1)
Sodium: 136 mmol/L (ref 135–145)
Total Bilirubin: 0.7 mg/dL (ref 0.3–1.2)
Total Protein: 7.1 g/dL (ref 6.5–8.1)

## 2021-06-15 MED ORDER — PROCHLORPERAZINE EDISYLATE 10 MG/2ML IJ SOLN
10.0000 mg | Freq: Once | INTRAMUSCULAR | Status: AC
  Administered 2021-06-15: 10 mg via INTRAVENOUS
  Filled 2021-06-15: qty 2

## 2021-06-15 MED ORDER — DIPHENHYDRAMINE HCL 50 MG/ML IJ SOLN
25.0000 mg | Freq: Once | INTRAMUSCULAR | Status: AC
  Administered 2021-06-15: 25 mg via INTRAVENOUS
  Filled 2021-06-15: qty 1

## 2021-06-15 MED ORDER — DEXAMETHASONE SODIUM PHOSPHATE 10 MG/ML IJ SOLN
10.0000 mg | Freq: Once | INTRAMUSCULAR | Status: AC
  Administered 2021-06-15: 10 mg via INTRAVENOUS
  Filled 2021-06-15: qty 1

## 2021-06-15 MED ORDER — SODIUM CHLORIDE 0.9 % IV BOLUS
1000.0000 mL | Freq: Once | INTRAVENOUS | Status: AC
  Administered 2021-06-15: 1000 mL via INTRAVENOUS

## 2021-06-15 NOTE — ED Provider Notes (Signed)
[facility] EMERGENCY DEPARTMENT Provider Note   CSN: [id number] Arrival date & time: 06/14/21  1940     History Chief Complaint  Patient presents with   Hypertension    Jessica Clayton is a 60 y.o. female.  60 year old female with history of hypertension presents emerged from today with headache and hypertension.  Patient states that she developed a headache yesterday.  Not necessarily severe in nature but different than her normal.  She checked her blood pressure was above 734 systolic.  This worried her she took an extra dose of her blood pressure medicine however her blood pressure stayed elevated.  It appears that she went to urgent care earlier today and they sent her to the ER for further evaluation.  She does not notice any blurry vision, neurologic changes, chest pain, shortness of breath, lower extremity swelling, severe pain in her chest, back or abdomen.  No new medicines other than diet medicine but does not have stimulants in it.  No change in caffeine.  No illicit drugs   Hypertension      Past Medical History:  Diagnosis Date   Arthritis    left shoulder, injected by Margaretmary Eddy 2012   Chest pain 05/08/2015   Chest pain at rest 02/05/2012   Normal Lexiscan  normal enzymes during June 2013 admission for chest pain ([place])   Complication of anesthesia    Sats dropped during cataract surgery   Hyperlipidemia    hypertriglyceridemia   Hypertension    Menopause, premature    at age 72   Obesity (BMI 30-39.9)    PAF (paroxysmal atrial fibrillation) (H[facility])    R/T low potassium   Sleep apnea    CPAP    Patient Active Problem List   Diagnosis Date Noted   B12 deficiency 04/28/2020   Right leg pain 04/28/2020   Neuropathy 05/01/2019   Family history of malignant neoplasm of gastrointestinal tract    Vitamin D deficiency 07/12/2016   Morbid obesity ([place]) 07/09/2015   Leg swelling 07/09/2015   Visit for preventive health examination 06/25/2014    Depression 05/11/2014   Hepatic steatosis 05/11/2014   Polyarthritis 05/11/2014   Shortness of breath 05/17/2013   Family history of colon cancer requiring screening colonoscopy 05/15/2013   OSA (obstructive sleep apnea) 05/15/2013   Annual physical exam 05/15/2013   Paroxysmal atrial fibrillation ([place]) 03/23/2013   Laryngospasm 03/01/2012   Pulmonary nodule, right 03/01/2012   Hyperlipidemia with target LDL less than 100 02/05/2012   Hypertension 05/15/2011   Headache, variant migraine 04/18/2011   Screening for cervical cancer 04/18/2011   Screening for breast cancer 04/18/2011   Screening for colon cancer 04/18/2011    Past Surgical History:  Procedure Laterality Date   CATARACT EXTRACTION W/ INTRAOCULAR LENS  IMPLANT, BILATERAL     CHOLECYSTECTOMY  1982   COLONOSCOPY WITH PROPOFOL N/A 04/06/2017   Procedure: COLONOSCOPY WITH PROPOFOL;  Surgeon: Lucilla Lame, MD;  Location: [facility];  Service: Gastroenterology;  Laterality: N/A;  sleep apnea   FOOT SURGERY Right    tumor   herniated disc     TUBAL LIGATION       OB History   No obstetric history on file.     Family History  Problem Relation Age of Onset   Cancer Mother    Alcohol abuse Mother    Cancer Father    COPD Father    Alcohol abuse Father    Heart disease Father    Hypertension Father  Hyperlipidemia Father    Early death Sister    COPD Sister    Heart disease Sister    Breast cancer Neg Hx     Social History   Tobacco Use   Smoking status: Never   Smokeless tobacco: Never  Vaping Use   Vaping Use: Never used  Substance Use Topics   Alcohol use: No   Drug use: No    Home Medications Prior to Admission medications   Medication Sig Start Date End Date Taking? Authorizing Provider  aspirin 81 MG tablet Take 81 mg by mouth daily.    [provider]  cyclobenzaprine (FLEXERIL) 5 MG tablet take 1 tablet by mouth at bedtime as needed 12/13/20     DANDELION PO Take by mouth  daily.    [provider]  meloxicam (MOBIC) 15 MG tablet Take 1 tablet every day by oral route. Patient not taking: Reported on 04/10/2021 02/25/21     MILK THISTLE PO Take by mouth daily.    [provider]  Semaglutide,0.25 or 0.5MG /DOS, (OZEMPIC, 0.25 OR 0.5 MG/DOSE,) 2 MG/1.5ML SOPN Inject 0.5 mg into the skin once a week. 05/15/21   Crecencio Mc, MD  telmisartan (MICARDIS) 20 MG tablet TAKE 1 TABLET (20 MG TOTAL) BY MOUTH DAILY. 05/13/21 05/13/22  Crecencio Mc, MD  TURMERIC PO Take by mouth daily.    [provider]  VITAMIN D PO Take by mouth.    [provider]    Allergies    Amoxicillin  Review of Systems   Review of Systems  All other systems reviewed and are negative.  Physical Exam Updated Vital Signs BP (!) 173/74   Pulse 70   Temp (!) 97.4 F (36.3 C) (Oral)   Resp 16   Ht 5\' 4"  (1.626 m)   Wt 103.9 kg   SpO2 98%   BMI 39.31 kg/m   Physical Exam Vitals and nursing note reviewed.  Constitutional:      Appearance: She is well-developed.  HENT:     Head: Normocephalic and atraumatic.     Mouth/Throat:     Mouth: Mucous membranes are moist.     Pharynx: Oropharynx is clear.  Eyes:     Pupils: Pupils are equal, round, and reactive to light.  Cardiovascular:     Rate and Rhythm: Normal rate and regular rhythm.  Pulmonary:     Effort: No respiratory distress.     Breath sounds: No stridor.  Abdominal:     General: Abdomen is flat. There is no distension.  Musculoskeletal:        General: No swelling or tenderness. Normal range of motion.     Cervical back: Normal range of motion.  Skin:    General: Skin is warm and dry.  Neurological:     General: No focal deficit present.     Mental Status: She is alert.     Comments: No altered mental status, able to give full seemingly accurate history.  Face is symmetric, EOM's intact, pupils equal and reactive, vision intact, tongue and uvula midline without deviation. Upper  and Lower extremity motor 5/5, intact pain perception in distal extremities, 2+ reflexes in biceps, patella and achilles tendons. Able to perform finger to nose normal with both hands. Walks without assistance or evident ataxia.     ED Results / Procedures / Treatments   Labs (all labs ordered are listed, but only abnormal results are displayed) Labs Reviewed  COMPREHENSIVE METABOLIC PANEL - Abnormal;  Notable for the following components:      Result Value   Potassium 3.4 (*)    Calcium 8.8 (*)    All other components within normal limits  CBC WITH DIFFERENTIAL/PLATELET    EKG None  Radiology CT Head Wo Contrast  Result Date: 06/15/2021 CLINICAL DATA:  Cerebral hemorrhage suspected. Hypertension and headache. EXAM: CT HEAD WITHOUT CONTRAST TECHNIQUE: Contiguous axial images were obtained from the base of the skull through the vertex without intravenous contrast. COMPARISON:  None. FINDINGS: Brain: Normal anatomic configuration. No abnormal intra or extra-axial mass lesion or fluid collection. No abnormal mass effect or midline shift. No evidence of acute intracranial hemorrhage or infarct. Ventricular size is normal. Cerebellum unremarkable. Vascular: Unremarkable Skull: Intact Sinuses/Orbits: Paranasal sinuses are clear. Orbits are unremarkable. Other: Mastoid air cells and middle ear cavities are clear. IMPRESSION: No acute intracranial abnormality.  Normal exam. Electronically Signed   By: Fidela Salisbury M.D.   On: 06/15/2021 00:52    Procedures Procedures   Medications Ordered in ED Medications  prochlorperazine (COMPAZINE) injection 10 mg (10 mg Intravenous Given 06/15/21 0042)  diphenhydrAMINE (BENADRYL) injection 25 mg (25 mg Intravenous Given 06/15/21 0044)  sodium chloride 0.9 % bolus 1,000 mL (0 mLs Intravenous Stopped 06/15/21 0237)  dexamethasone (DECADRON) injection 10 mg (10 mg Intravenous Given 06/15/21 0043)    ED Course  I have reviewed the triage vital signs and  the nursing notes.  Pertinent labs & imaging results that were available during my care of the patient were reviewed by me and considered in my medical decision making (see chart for details).    MDM Rules/Calculators/A&P                         Bp improved with headache control. No obvious cause for headaches on ct scan. HA improved. Will dc to do HTN log/PCP follow up. Will return here for new/worsening symptoms.    Final Clinical Impression(s) / ED Diagnoses Final diagnoses:  Hypertension, unspecified type  Acute nonintractable headache, unspecified headache type    Rx / DC Orders ED Discharge Orders     None        Cohen Boettner, Corene Cornea, MD 06/15/21 0800

## 2021-06-25 ENCOUNTER — Other Ambulatory Visit: Payer: Self-pay

## 2021-06-25 DIAGNOSIS — M1712 Unilateral primary osteoarthritis, left knee: Secondary | ICD-10-CM | POA: Diagnosis not present

## 2021-06-25 DIAGNOSIS — M25562 Pain in left knee: Secondary | ICD-10-CM | POA: Diagnosis not present

## 2021-06-25 DIAGNOSIS — R52 Pain, unspecified: Secondary | ICD-10-CM | POA: Diagnosis not present

## 2021-06-25 MED ORDER — MELOXICAM 15 MG PO TABS
ORAL_TABLET | ORAL | 0 refills | Status: DC
  Filled 2021-06-25: qty 30, 30d supply, fill #0

## 2021-06-27 ENCOUNTER — Other Ambulatory Visit: Payer: Self-pay

## 2021-07-17 DIAGNOSIS — M2392 Unspecified internal derangement of left knee: Secondary | ICD-10-CM | POA: Diagnosis not present

## 2021-07-17 DIAGNOSIS — M25562 Pain in left knee: Secondary | ICD-10-CM | POA: Diagnosis not present

## 2021-07-25 ENCOUNTER — Other Ambulatory Visit: Payer: Self-pay

## 2021-07-25 ENCOUNTER — Ambulatory Visit: Payer: 59 | Admitting: Internal Medicine

## 2021-07-25 ENCOUNTER — Encounter: Payer: Self-pay | Admitting: Internal Medicine

## 2021-07-25 VITALS — BP 126/68 | HR 70 | Temp 95.8°F | Ht 64.0 in | Wt 218.0 lb

## 2021-07-25 DIAGNOSIS — E785 Hyperlipidemia, unspecified: Secondary | ICD-10-CM

## 2021-07-25 DIAGNOSIS — I10 Essential (primary) hypertension: Secondary | ICD-10-CM | POA: Diagnosis not present

## 2021-07-25 DIAGNOSIS — E876 Hypokalemia: Secondary | ICD-10-CM

## 2021-07-25 DIAGNOSIS — R7301 Impaired fasting glucose: Secondary | ICD-10-CM | POA: Diagnosis not present

## 2021-07-25 LAB — BASIC METABOLIC PANEL
BUN: 14 mg/dL (ref 6–23)
CO2: 29 mEq/L (ref 19–32)
Calcium: 9.6 mg/dL (ref 8.4–10.5)
Chloride: 102 mEq/L (ref 96–112)
Creatinine, Ser: 0.71 mg/dL (ref 0.40–1.20)
GFR: 92.02 mL/min (ref >60.00)
Glucose, Bld: 78 mg/dL (ref 70–99)
Potassium: 3.9 mEq/L (ref 3.5–5.1)
Sodium: 138 mEq/L (ref 135–145)

## 2021-07-25 LAB — MAGNESIUM: Magnesium: 2.1 mg/dL (ref 1.5–2.5)

## 2021-07-25 MED ORDER — TRAMADOL HCL 50 MG PO TABS
50.0000 mg | ORAL_TABLET | Freq: Four times a day (QID) | ORAL | 0 refills | Status: AC | PRN
  Filled 2021-07-25: qty 28, 7d supply, fill #0

## 2021-07-25 MED ORDER — SEMAGLUTIDE (1 MG/DOSE) 4 MG/3ML ~~LOC~~ SOPN
1.0000 mg | PEN_INJECTOR | SUBCUTANEOUS | 2 refills | Status: DC
  Filled 2021-07-25: qty 3, 28d supply, fill #0
  Filled 2021-08-29: qty 3, 28d supply, fill #1
  Filled 2021-10-04: qty 3, 28d supply, fill #2

## 2021-07-25 NOTE — Progress Notes (Signed)
Subjective:  Patient ID: Jessica Clayton, female    DOB: February 20, 1961  Age: 60 y.o. MRN: [id number]  CC: The primary encounter diagnosis was Hypokalemia. Diagnoses of Morbid obesity ([place]) and Primary hypertension were also pertinent to this visit.  HPI Jessica Clayton presents for elevated blood pressure  This visit occurred during the SARS-CoV-2 public health emergency.  Safety protocols were in place, including screening questions prior to the visit, additional usage of staff PPE, and extensive cleaning of exam room while observing appropriate contact time as indicated for disinfecting solutions.   Treated in ED  on oct 28 at [facility] for  new onset severe  headache  that had been present for over 6 hours ,   worse  in setting of elevated systolic blood pressure (report of 938 systolic at home in the middle of the night).  ,  170 in ER).  Givne cocktail for headache,  and BP improved  Home readings have improved and at home have been < 140  until two weeks ago when reading s became elevated to 170 .  Has not had a severe headache,  but started having sinus headache on Monday when she also had ear pain and has been treating with coricidin   Recent injections of left knee with synvisc injections  by Donney Rankins after cortisone injections failed.  Has had a flareup of bursitis and was treated with steroid injection 2 weeks ago . Taking meloxicam     Obesity:  taking 1 mg ozempic for the past 4 weeks .  Weight appears to be influx     Outpatient Medications Prior to Visit  Medication Sig Dispense Refill   aspirin 81 MG tablet Take 81 mg by mouth daily.     cyclobenzaprine (FLEXERIL) 5 MG tablet take 1 tablet by mouth at bedtime as needed 30 tablet 0   DANDELION PO Take by mouth daily.     meloxicam (MOBIC) 15 MG tablet Take 1 tablet every day by oral route with meals. 30 tablet 0   MILK THISTLE PO Take by mouth daily.     telmisartan (MICARDIS) 20 MG tablet TAKE 1 TABLET (20 MG  TOTAL) BY MOUTH DAILY. 90 tablet 0   TURMERIC PO Take by mouth daily.     VITAMIN D PO Take by mouth.     Semaglutide,0.25 or 0.5MG /DOS, (OZEMPIC, 0.25 OR 0.5 MG/DOSE,) 2 MG/1.5ML SOPN Inject 0.5 mg into the skin once a week. 1.5 mL 1   meloxicam (MOBIC) 15 MG tablet Take 1 tablet every day by oral route. (Patient not taking: Reported on 07/25/2021) 30 tablet 2   No facility-administered medications prior to visit.    Review of Systems;  Patient denies headache, fevers, malaise, unintentional weight loss, skin rash, eye pain, sinus congestion and sinus pain, sore throat, dysphagia,  hemoptysis , cough, dyspnea, wheezing, chest pain, palpitations, orthopnea, edema, abdominal pain, nausea, melena, diarrhea, constipation, flank pain, dysuria, hematuria, urinary  Frequency, nocturia, numbness, tingling, seizures,  Focal weakness, Loss of consciousness,  Tremor, insomnia, depression, anxiety, and suicidal ideation.      Objective:  BP 126/68 (BP Location: Left Arm, Patient Position: Sitting, Cuff Size: Large)   Pulse 70   Temp (!) 95.8 F (35.4 C) (Temporal)   Ht 5\' 4"  (1.626 m)   Wt 218 lb (98.9 kg)   SpO2 99%   BMI 37.42 kg/m   BP Readings from Last 3 Encounters:  07/25/21 126/68  06/15/21 (!) 173/74  06/14/21 Marland Kitchen)  184/76    Wt Readings from Last 3 Encounters:  07/25/21 218 lb (98.9 kg)  06/14/21 229 lb (103.9 kg)  04/10/21 243 lb (110.2 kg)    General appearance: alert, cooperative and appears stated age Ears: normal TM's and external ear canals both ears Throat: lips, mucosa, and tongue normal; teeth and gums normal Neck: no adenopathy, no carotid bruit, supple, symmetrical, trachea midline and thyroid not enlarged, symmetric, no tenderness/mass/nodules Back: symmetric, no curvature. ROM normal. No CVA tenderness. Lungs: clear to auscultation bilaterally Heart: regular rate and rhythm, S1, S2 normal, no murmur, click, rub or gallop Abdomen: soft, non-tender; bowel sounds  normal; no masses,  no organomegaly Pulses: 2+ and symmetric Skin: Skin color, texture, turgor normal. No rashes or lesions Lymph nodes: Cervical, supraclavicular, and axillary nodes normal.  Lab Results  Component Value Date   HGBA1C 5.7 04/11/2021   HGBA1C 5.7 05/09/2020   HGBA1C 5.5 11/01/2018    Lab Results  Component Value Date   CREATININE 0.67 06/15/2021   CREATININE 0.84 04/11/2021   CREATININE 0.77 05/09/2020   CREATININE 0.77 05/09/2020    Lab Results  Component Value Date   WBC 6.1 06/15/2021   HGB 13.0 06/15/2021   HCT 39.0 06/15/2021   PLT 280 06/15/2021   GLUCOSE 96 06/15/2021   CHOL 168 04/11/2021   TRIG 114.0 04/11/2021   HDL 52.40 04/11/2021   LDLCALC 93 04/11/2021   ALT 25 06/15/2021   AST 21 06/15/2021   NA 136 06/15/2021   K 3.4 (L) 06/15/2021   CL 102 06/15/2021   CREATININE 0.67 06/15/2021   BUN 8 06/15/2021   CO2 26 06/15/2021   TSH 3.27 04/11/2021   HGBA1C 5.7 04/11/2021   MICROALBUR <0.7 11/01/2018    CT Head Wo Contrast  Result Date: 06/15/2021 CLINICAL DATA:  Cerebral hemorrhage suspected. Hypertension and headache. EXAM: CT HEAD WITHOUT CONTRAST TECHNIQUE: Contiguous axial images were obtained from the base of the skull through the vertex without intravenous contrast. COMPARISON:  None. FINDINGS: Brain: Normal anatomic configuration. No abnormal intra or extra-axial mass lesion or fluid collection. No abnormal mass effect or midline shift. No evidence of acute intracranial hemorrhage or infarct. Ventricular size is normal. Cerebellum unremarkable. Vascular: Unremarkable Skull: Intact Sinuses/Orbits: Paranasal sinuses are clear. Orbits are unremarkable. Other: Mastoid air cells and middle ear cavities are clear. IMPRESSION: No acute intracranial abnormality.  Normal exam. Electronically Signed   By: Fidela Salisbury M.D.   On: 06/15/2021 00:52    Assessment & Plan:   Problem List Items Addressed This Visit     Morbid obesity ([place])    35  lb eight loss since starting Ozempic.  Currently taking 1 mg weekly, weight has not plateaued yet.       Relevant Medications   Semaglutide, 1 MG/DOSE, 4 MG/3ML SOPN   Hypertension    Recent elevations may be due to uncontrolled knee pain and daily use of NSAIDS (meloxicam).  Advised to suspend all nsaids,  Use tramadol/tylenol for pain management, and recheck BP in 48 hours.  Increase telmisartan to 40mg  daily if systolic  BP is 478 or higher       Other Visit Diagnoses     Hypokalemia    -  Primary   Relevant Orders   Basic metabolic panel   Magnesium       I have discontinued Thayer Headings C. Mah's Ozempic (0.25 or 0.5 MG/DOSE). I am also having her start on traMADol and Semaglutide (1 MG/DOSE). Additionally, I am  having her maintain her aspirin, MILK THISTLE PO, DANDELION PO, TURMERIC PO, VITAMIN D PO, cyclobenzaprine, telmisartan, and meloxicam.  Meds ordered this encounter  Medications   traMADol (ULTRAM) 50 MG tablet    Sig: Take 1 tablet (50 mg total) by mouth every 6 (six) hours as needed for up to 7 days.    Dispense:  28 tablet    Refill:  0   Semaglutide, 1 MG/DOSE, 4 MG/3ML SOPN    Sig: Inject 1 mg as directed once a week.    Dispense:  3 mL    Refill:  2     I provided  30 minutes of  face-to-face time during this encounter reviewing patient's current problems and past surgeries, labs and imaging studies, providing counseling on the above mentioned problems , and coordination  of care .   Follow-up: No follow-ups on file.   Crecencio Mc, MD

## 2021-07-25 NOTE — Patient Instructions (Addendum)
Tramadol 50 mg every 6 hours for knee pain  Ok to add up to 2000 mg of tylenol every day if needed in divided doses   No more meloxicam   advil or motrin   BP  should improve once meloxicam is out of system 48 hours )   If BP on Saturday is 150 or higher,   (one hour after telmisartan dose ) increase telmisartan dose to 40 mg daily     Ozempic:  Continue 1 mg dose for now.  If weight plateaus for > 1 week let me know and I will increase the dose

## 2021-07-25 NOTE — Assessment & Plan Note (Signed)
35 lb eight loss since starting Ozempic.  Currently taking 1 mg weekly, weight has not plateaued yet.

## 2021-07-25 NOTE — Assessment & Plan Note (Signed)
Recent elevations may be due to uncontrolled knee pain and daily use of NSAIDS (meloxicam).  Advised to suspend all nsaids,  Use tramadol/tylenol for pain management, and recheck BP in 48 hours.  Increase telmisartan to 40mg  daily if systolic  BP is 957 or higher

## 2021-07-28 NOTE — Addendum Note (Signed)
Addended by: Crecencio Mc on: 07/28/2021 02:44 PM   Modules accepted: Orders

## 2021-07-30 DIAGNOSIS — M2392 Unspecified internal derangement of left knee: Secondary | ICD-10-CM | POA: Diagnosis not present

## 2021-08-02 ENCOUNTER — Other Ambulatory Visit: Payer: Self-pay

## 2021-08-02 DIAGNOSIS — M1712 Unilateral primary osteoarthritis, left knee: Secondary | ICD-10-CM | POA: Diagnosis not present

## 2021-08-02 DIAGNOSIS — S83242A Other tear of medial meniscus, current injury, left knee, initial encounter: Secondary | ICD-10-CM | POA: Diagnosis not present

## 2021-08-02 DIAGNOSIS — M23302 Other meniscus derangements, unspecified lateral meniscus, unspecified knee: Secondary | ICD-10-CM | POA: Diagnosis not present

## 2021-08-02 MED ORDER — METHYLPREDNISOLONE 4 MG PO TBPK
ORAL_TABLET | ORAL | 0 refills | Status: DC
  Filled 2021-08-02: qty 21, 6d supply, fill #0

## 2021-08-21 DIAGNOSIS — S83222D Peripheral tear of medial meniscus, current injury, left knee, subsequent encounter: Secondary | ICD-10-CM | POA: Diagnosis not present

## 2021-08-29 ENCOUNTER — Other Ambulatory Visit: Payer: Self-pay | Admitting: Internal Medicine

## 2021-08-29 ENCOUNTER — Other Ambulatory Visit: Payer: Self-pay

## 2021-08-29 MED ORDER — CARESTART COVID-19 HOME TEST VI KIT
PACK | 0 refills | Status: DC
  Filled 2021-08-29: qty 2, 4d supply, fill #0

## 2021-08-29 MED ORDER — TELMISARTAN 20 MG PO TABS
ORAL_TABLET | Freq: Every day | ORAL | 1 refills | Status: DC
  Filled 2021-08-29: qty 90, 90d supply, fill #0
  Filled 2021-08-29 – 2021-12-23 (×2): qty 90, 90d supply, fill #1

## 2021-09-02 DIAGNOSIS — S83222D Peripheral tear of medial meniscus, current injury, left knee, subsequent encounter: Secondary | ICD-10-CM | POA: Diagnosis not present

## 2021-09-05 DIAGNOSIS — S83222D Peripheral tear of medial meniscus, current injury, left knee, subsequent encounter: Secondary | ICD-10-CM | POA: Diagnosis not present

## 2021-09-09 DIAGNOSIS — S83222D Peripheral tear of medial meniscus, current injury, left knee, subsequent encounter: Secondary | ICD-10-CM | POA: Diagnosis not present

## 2021-09-12 DIAGNOSIS — G4733 Obstructive sleep apnea (adult) (pediatric): Secondary | ICD-10-CM | POA: Diagnosis not present

## 2021-09-12 DIAGNOSIS — S83222D Peripheral tear of medial meniscus, current injury, left knee, subsequent encounter: Secondary | ICD-10-CM | POA: Diagnosis not present

## 2021-09-16 DIAGNOSIS — S83222D Peripheral tear of medial meniscus, current injury, left knee, subsequent encounter: Secondary | ICD-10-CM | POA: Diagnosis not present

## 2021-09-19 DIAGNOSIS — S83222D Peripheral tear of medial meniscus, current injury, left knee, subsequent encounter: Secondary | ICD-10-CM | POA: Diagnosis not present

## 2021-10-04 ENCOUNTER — Other Ambulatory Visit: Payer: Self-pay

## 2021-10-27 ENCOUNTER — Encounter: Payer: Self-pay | Admitting: Internal Medicine

## 2021-10-27 ENCOUNTER — Other Ambulatory Visit: Payer: Self-pay | Admitting: Internal Medicine

## 2021-10-27 MED ORDER — OZEMPIC (1 MG/DOSE) 4 MG/3ML ~~LOC~~ SOPN
1.0000 mg | PEN_INJECTOR | SUBCUTANEOUS | 2 refills | Status: DC
  Filled 2021-10-27: qty 3, 28d supply, fill #0

## 2021-10-28 ENCOUNTER — Other Ambulatory Visit: Payer: Self-pay

## 2021-11-13 ENCOUNTER — Ambulatory Visit: Payer: 59 | Admitting: Internal Medicine

## 2021-11-13 ENCOUNTER — Encounter: Payer: Self-pay | Admitting: Internal Medicine

## 2021-11-13 VITALS — BP 116/72 | HR 58 | Temp 97.9°F | Ht 64.0 in | Wt 197.2 lb

## 2021-11-13 DIAGNOSIS — L659 Nonscarring hair loss, unspecified: Secondary | ICD-10-CM

## 2021-11-13 DIAGNOSIS — Z1231 Encounter for screening mammogram for malignant neoplasm of breast: Secondary | ICD-10-CM

## 2021-11-13 DIAGNOSIS — E611 Iron deficiency: Secondary | ICD-10-CM

## 2021-11-13 DIAGNOSIS — L650 Telogen effluvium: Secondary | ICD-10-CM | POA: Diagnosis not present

## 2021-11-13 NOTE — Assessment & Plan Note (Addendum)
>  40  lb weight loss since starting Ozempic.  Currently taking 1 mg weekly, weight has not plateaued yet.  ?

## 2021-11-13 NOTE — Progress Notes (Signed)
? ?Subjective:  ?Patient ID: Jessica Clayton, female    DOB: 03/14/1961  Age: 61 y.o. MRN: [id number] ? ?CC: The primary encounter diagnosis was Encounter for screening mammogram for malignant neoplasm of breast. Diagnoses of Alopecia, Acute telogen effluvium, and Morbid obesity ([place]) were also pertinent to this visit. ? ? ?This visit occurred during the SARS-CoV-2 public health emergency.  Safety protocols were in place, including screening questions prior to the visit, additional usage of staff PPE, and extensive cleaning of exam room while observing appropriate contact time as indicated for disinfecting solutions.   ? ?HPI ?Jessica Clayton presents for  ?Chief Complaint  ?Patient presents with  ? Follow-up  ?  F/u for hair loss. Pt states lately has been loosing noticeable amount of hair. States she finds hair all over her, if she runs fingers through her hair she has a considerable amount of hair in hand. Concerned it is a vitamin deficiency from loosing weight.  ? ?Hair loss first noticed 6 weeks ago. Diffuse,  coming out with brushing running hands through hair. ? ? ?Had COVID In December  with symptoms limited to GI , body aches and headache.  Had  projectile vomiting  for 48 hours ,  was very sick for 10 days, took a month to recover energy level   ? ?Obesity:  taking Ozempic  has lost > 40 lbs since the start.  Reviewed diet:  no deficiencies identified, just reducing portion sizes.   Taking a MVW for women > 50  ? ? ?Outpatient Medications Prior to Visit  ?Medication Sig Dispense Refill  ? aspirin 81 MG tablet Take 81 mg by mouth daily.    ? COVID-19 At Home Antigen Test [facility] COVID-19 HOME TEST) KIT Use as directed 2 kit 0  ? cyclobenzaprine (FLEXERIL) 5 MG tablet take 1 tablet by mouth at bedtime as needed 30 tablet 0  ? DANDELION PO Take by mouth daily.    ? meloxicam (MOBIC) 15 MG tablet Take 1 tablet every day by oral route with meals. 30 tablet 0  ? methylPREDNISolone (MEDROL) 4 MG  TBPK tablet Take 1 dose pk by oral route. 21 tablet 0  ? MILK THISTLE PO Take by mouth daily.    ? Semaglutide, 1 MG/DOSE, (OZEMPIC, 1 MG/DOSE,) 4 MG/3ML SOPN Inject 1 mg as directed once a week. 3 mL 2  ? telmisartan (MICARDIS) 20 MG tablet TAKE 1 TABLET (20 MG TOTAL) BY MOUTH DAILY. 90 tablet 1  ? TURMERIC PO Take by mouth daily.    ? VITAMIN D PO Take by mouth.    ? ?No facility-administered medications prior to visit.  ? ? ?Review of Systems; ? ?Patient denies headache, fevers, malaise, unintentional weight loss, skin rash, eye pain, sinus congestion and sinus pain, sore throat, dysphagia,  hemoptysis , cough, dyspnea, wheezing, chest pain, palpitations, orthopnea, edema, abdominal pain, nausea, melena, diarrhea, constipation, flank pain, dysuria, hematuria, urinary  Frequency, nocturia, numbness, tingling, seizures,  Focal weakness, Loss of consciousness,  Tremor, insomnia, depression, anxiety, and suicidal ideation.   ? ? ? ?Objective:  ?BP 116/72 (BP Location: Left Arm, Patient Position: Sitting, Cuff Size: Small)   Pulse (!) 58   Temp 97.9 ?F (36.6 ?C) (Oral)   Ht $R'5\' 4"'zL$  (1.626 m)   Wt 197 lb 3.2 oz (89.4 kg)   SpO2 98%   BMI 33.85 kg/m?  ? ?BP Readings from Last 3 Encounters:  ?11/13/21 116/72  ?07/25/21 126/68  ?06/15/21 (!) 173/74  ? ? ?  Wt Readings from Last 3 Encounters:  ?11/13/21 197 lb 3.2 oz (89.4 kg)  ?07/25/21 218 lb (98.9 kg)  ?06/14/21 229 lb (103.9 kg)  ? ? ?General appearance: alert, cooperative and appears stated age ?Ears: normal TM's and external ear canals both ears ?Throat: lips, mucosa, and tongue normal; teeth and gums normal ?Neck: no adenopathy, no carotid bruit, supple, symmetrical, trachea midline and thyroid not enlarged, symmetric, no tenderness/mass/nodules ?Back: symmetric, no curvature. ROM normal. No CVA tenderness. ?Lungs: clear to auscultation bilaterally ?Heart: regular rate and rhythm, S1, S2 normal, no murmur, click, rub or gallop ?Abdomen: soft, non-tender; bowel  sounds normal; no masses,  no organomegaly ?Pulses: 2+ and symmetric ?Skin: Skin color, texture, turgor normal. No rashes or lesions ?Lymph nodes: Cervical, supraclavicular, and axillary nodes normal. ? ?Lab Results  ?Component Value Date  ? HGBA1C 5.7 04/11/2021  ? HGBA1C 5.7 05/09/2020  ? HGBA1C 5.5 11/01/2018  ? ? ?Lab Results  ?Component Value Date  ? CREATININE 0.71 07/25/2021  ? CREATININE 0.67 06/15/2021  ? CREATININE 0.84 04/11/2021  ? ? ?Lab Results  ?Component Value Date  ? WBC 6.1 06/15/2021  ? HGB 13.0 06/15/2021  ? HCT 39.0 06/15/2021  ? PLT 280 06/15/2021  ? GLUCOSE 78 07/25/2021  ? CHOL 168 04/11/2021  ? TRIG 114.0 04/11/2021  ? HDL 52.40 04/11/2021  ? [place] 93 04/11/2021  ? ALT 25 06/15/2021  ? AST 21 06/15/2021  ? NA 138 07/25/2021  ? K 3.9 07/25/2021  ? CL 102 07/25/2021  ? CREATININE 0.71 07/25/2021  ? BUN 14 07/25/2021  ? CO2 29 07/25/2021  ? TSH 1.98 11/13/2021  ? HGBA1C 5.7 04/11/2021  ? MICROALBUR <0.7 11/01/2018  ? ? ?CT Head Wo Contrast ? ?Result Date: 06/15/2021 ?CLINICAL DATA:  Cerebral hemorrhage suspected. Hypertension and headache. EXAM: CT HEAD WITHOUT CONTRAST TECHNIQUE: Contiguous axial images were obtained from the base of the skull through the vertex without intravenous contrast. COMPARISON:  None. FINDINGS: Brain: Normal anatomic configuration. No abnormal intra or extra-axial mass lesion or fluid collection. No abnormal mass effect or midline shift. No evidence of acute intracranial hemorrhage or infarct. Ventricular size is normal. Cerebellum unremarkable. Vascular: Unremarkable Skull: Intact Sinuses/Orbits: Paranasal sinuses are clear. Orbits are unremarkable. Other: Mastoid air cells and middle ear cavities are clear. IMPRESSION: No acute intracranial abnormality.  Normal exam. Electronically Signed   By: Fidela Salisbury M.D.   On: 06/15/2021 00:52  ? ? ?Assessment & Plan:  ? ?Problem List Items Addressed This Visit   ? ? Screening for breast cancer - Primary  ? Relevant  Orders  ? MM 3D SCREEN BREAST BILATERAL  ? Morbid obesity ([place])  ?  >40  lb weight loss since starting Ozempic.  Currently taking 1 mg weekly, weight has not plateaued yet.  ?  ?  ? Acute telogen effluvium  ?  LIKELY secondary to recent COVID infection (Dec 2022)  And concurrent weight loss (>40 lbs using Ozempic),  But will check alopecia labs today. ?  ?  ? ?Other Visit Diagnoses   ? ? Alopecia      ? Relevant Orders  ? TSH (Completed)  ? IBC panel (Completed)  ? Vitamin D 1,25 dihydroxy  ? Vitamin D 25 hydroxy (Completed)  ? DHEA-sulfate (Completed)  ? Testos,Total,Free and SHBG (Female)  ? ?  ? ? ?I spent 30 minutes dedicated to the care of this patient on the date of this encounter to include pre-visit review of patient's medical history,  most recent  office visits with me and patient's specialists,  most recent ER visit/hospitalization, EKG, imaging studies, Face-to-face time with the patient , and post visit ordering of testing and therapeutics.   ? ?Follow-up: No follow-ups on file. ? ? ?Crecencio Mc, MD ?

## 2021-11-13 NOTE — Patient Instructions (Addendum)
?  I will check for signs of thyroid or other deficiencies that can cause your hair loss.  Most of the time hair loss is  temporary and caused by emotional or physical stress (like COVID AND WEIGHT LOSS ) ; this type of hair loss resolves with time. Marland Kitchen  However some women do develop female pattern baldness that usually affects the crown  (top part) of the head and is longer lasting.  If you would like a referral to dermatology for further evaluation , please let me know and I will be happy to start the process.  ?  ? ?Congratulations on the weight loss!   ? ?As long as you are losing weight,  you can continue the dose you are on .  Only request an increase in  the dose to  the next higher dose  your weight has plateaued for over  week .  Let me know when you need a refill and what dose you are taking.   ?

## 2021-11-13 NOTE — Assessment & Plan Note (Signed)
LIKELY secondary to recent COVID infection (Dec 2022)  And concurrent weight loss (>40 lbs using Ozempic),  But will check alopecia labs today. ?

## 2021-11-14 LAB — IBC PANEL
Iron: 38 ug/dL — ABNORMAL LOW (ref 42–145)
Saturation Ratios: 8.9 % — ABNORMAL LOW (ref 20.0–50.0)
TIBC: 427 ug/dL (ref 250.0–450.0)
Transferrin: 305 mg/dL (ref 212.0–360.0)

## 2021-11-14 LAB — VITAMIN D 25 HYDROXY (VIT D DEFICIENCY, FRACTURES): VITD: 27.17 ng/mL — ABNORMAL LOW (ref 30.00–100.00)

## 2021-11-14 LAB — TSH: TSH: 1.98 u[IU]/mL (ref 0.35–5.50)

## 2021-11-15 ENCOUNTER — Telehealth: Payer: Self-pay | Admitting: Internal Medicine

## 2021-11-15 ENCOUNTER — Other Ambulatory Visit: Payer: Self-pay

## 2021-11-15 ENCOUNTER — Encounter: Payer: Self-pay | Admitting: Internal Medicine

## 2021-11-15 NOTE — Telephone Encounter (Signed)
Patient saw Dr Derrel Nip yesterday and she told patient to take allergy medication for her throat. She has taken it but she has a sore throat, temp 99.7, cough, and losing voice. She is requesting medication. ?

## 2021-11-18 ENCOUNTER — Ambulatory Visit (INDEPENDENT_AMBULATORY_CARE_PROVIDER_SITE_OTHER): Payer: 59

## 2021-11-18 ENCOUNTER — Ambulatory Visit
Admission: RE | Admit: 2021-11-18 | Discharge: 2021-11-18 | Disposition: A | Discharge status: Home / Self Care | Payer: 59 | Source: Ambulatory Visit | Attending: Emergency Medicine | Admitting: Emergency Medicine

## 2021-11-18 VITALS — BP 118/75 | HR 69 | Temp 98.2°F | Resp 18

## 2021-11-18 DIAGNOSIS — R059 Cough, unspecified: Secondary | ICD-10-CM | POA: Diagnosis not present

## 2021-11-18 DIAGNOSIS — R058 Other specified cough: Secondary | ICD-10-CM

## 2021-11-18 DIAGNOSIS — J209 Acute bronchitis, unspecified: Secondary | ICD-10-CM | POA: Diagnosis not present

## 2021-11-18 DIAGNOSIS — R0602 Shortness of breath: Secondary | ICD-10-CM | POA: Diagnosis not present

## 2021-11-18 MED ORDER — AEROCHAMBER PLUS FLO-VU MEDIUM MISC
1.0000 | Freq: Once | Status: AC
  Administered 2021-11-18: 1

## 2021-11-18 MED ORDER — PREDNISONE 10 MG (21) PO TBPK: ORAL_TABLET | Freq: Every day | ORAL | 0 refills | Status: DC

## 2021-11-18 MED ORDER — ALBUTEROL SULFATE HFA 108 (90 BASE) MCG/ACT IN AERS
2.0000 | INHALATION_SPRAY | Freq: Once | RESPIRATORY_TRACT | Status: AC
  Administered 2021-11-18: 2 via RESPIRATORY_TRACT

## 2021-11-18 NOTE — ED Provider Notes (Signed)
?[facility] ? ? ? ?CSN: [id number] ?Arrival date & time: 11/18/21  1636 ? ? ?  ? ?History   ?Chief Complaint ?Chief Complaint  ?Patient presents with  ? Cough  ?  What started as a tickle in my throat has turned into a continuous deep cough, along with headaches, body aches, sore throat. Took home Covid test it's negative. Took OTC medicine , no relief. Ran fever two days 101, no fever this morning. - Entered by patient  ? Sore Throat  ? Headache  ? Generalized Body Aches  ? ? ?HPI ?Jessica Clayton is a 61 y.o. female.  Patient presents with 4-day history of fatigue, headache, body aches, sinus congestion, postnasal drip, sore throat, cough productive of white sputum.  She has shortness of breath with coughing.  She had a fever 2 days ago but none since.  Treatment at home with Mucinex.  She denies chest pain, vomiting, diarrhea, or other symptoms.  Her medical history includes hypertension, atrial fibrillation, hyperlipidemia, pulmonary nodule, OSA, fatty liver, obesity, migraine headache, neuropathy. ? ?The history is provided by the patient and medical records.  ? ?Past Medical History:  ?Diagnosis Date  ? Arthritis   ? left shoulder, injected by Margaretmary Eddy 2012  ? Chest pain 05/08/2015  ? Chest pain at rest 02/05/2012  ? Normal Lexiscan  normal enzymes during June 2013 admission for chest pain ([place])  ? Complication of anesthesia   ? Sats dropped during cataract surgery  ? Hyperlipidemia   ? hypertriglyceridemia  ? Hypertension   ? Menopause, premature   ? at age 89  ? Obesity (BMI 30-39.9)   ? PAF (paroxysmal atrial fibrillation) ([place])   ? R/T low potassium  ? Sleep apnea   ? CPAP  ? ? ?Patient Active Problem List  ? Diagnosis Date Noted  ? Acute telogen effluvium 11/13/2021  ? B12 deficiency 04/28/2020  ? Right leg pain 04/28/2020  ? Neuropathy 05/01/2019  ? Family history of malignant neoplasm of gastrointestinal tract   ? Vitamin D deficiency 07/12/2016  ? Morbid obesity ([place]) 07/09/2015  ?  Leg swelling 07/09/2015  ? Visit for preventive health examination 06/25/2014  ? Depression 05/11/2014  ? Hepatic steatosis 05/11/2014  ? Polyarthritis 05/11/2014  ? Shortness of breath 05/17/2013  ? Family history of colon cancer requiring screening colonoscopy 05/15/2013  ? OSA (obstructive sleep apnea) 05/15/2013  ? Annual physical exam 05/15/2013  ? Paroxysmal atrial fibrillation ([place]) 03/23/2013  ? Laryngospasm 03/01/2012  ? Pulmonary nodule, right 03/01/2012  ? Hyperlipidemia with target LDL less than 100 02/05/2012  ? Hypertension 05/15/2011  ? Headache, variant migraine 04/18/2011  ? Screening for cervical cancer 04/18/2011  ? Screening for breast cancer 04/18/2011  ? Screening for colon cancer 04/18/2011  ? ? ?Past Surgical History:  ?Procedure Laterality Date  ? CATARACT EXTRACTION W/ INTRAOCULAR LENS  IMPLANT, BILATERAL    ? CHOLECYSTECTOMY  1982  ? COLONOSCOPY WITH PROPOFOL N/A 04/06/2017  ? Procedure: COLONOSCOPY WITH PROPOFOL;  Surgeon: Lucilla Lame, MD;  Location: [employer] [facility];  Service: Gastroenterology;  Laterality: N/A;  sleep apnea  ? FOOT SURGERY Right   ? tumor  ? herniated disc    ? TUBAL LIGATION    ? ? ?OB History   ?No obstetric history on file. ?  ? ? ? ?Home Medications   ? ?Prior to Admission medications   ?Medication Sig Start Date End Date Taking? Authorizing Provider  ?predniSONE (STERAPRED UNI-PAK 21 TAB) 10 MG (21)  TBPK tablet Take by mouth daily. As directed 11/18/21  Yes Sharion Balloon, NP  ?aspirin 81 MG tablet Take 81 mg by mouth daily.    [provider]  ?COVID-19 At Home Antigen Test [facility] COVID-19 HOME TEST) KIT Use as directed 08/29/21   Letta Median, RPH  ?cyclobenzaprine (FLEXERIL) 5 MG tablet take 1 tablet by mouth at bedtime as needed 12/13/20     ?DANDELION PO Take by mouth daily.    [provider]  ?meloxicam (MOBIC) 15 MG tablet Take 1 tablet every day by oral route with meals. 06/25/21     ?MILK THISTLE PO Take by mouth daily.    [provider]  ?Semaglutide, 1 MG/DOSE, (OZEMPIC, 1 MG/DOSE,) 4 MG/3ML SOPN Inject 1 mg as directed once a week. 10/27/21   Kennyth Arnold, FNP  ?telmisartan (MICARDIS) 20 MG tablet TAKE 1 TABLET (20 MG TOTAL) BY MOUTH DAILY. 08/29/21 08/29/22  Crecencio Mc, MD  ?TURMERIC PO Take by mouth daily.    [provider]  ?VITAMIN D PO Take by mouth.    [provider]  ? ? ?Family History ?Family History  ?Problem Relation Age of Onset  ? Cancer Mother   ? Alcohol abuse Mother   ? Cancer Father   ? COPD Father   ? Alcohol abuse Father   ? Heart disease Father   ? Hypertension Father   ? Hyperlipidemia Father   ? Early death Sister   ? COPD Sister   ? Heart disease Sister   ? Breast cancer Neg Hx   ? ? ?Social History ?Social History  ? ?Tobacco Use  ? Smoking status: Never  ? Smokeless tobacco: Never  ?Vaping Use  ? Vaping Use: Never used  ?Substance Use Topics  ? Alcohol use: No  ? Drug use: No  ? ? ? ?Allergies   ?Amoxicillin ? ? ?Review of Systems ?Review of Systems  ?Constitutional:  Positive for fatigue and fever. Negative for chills.  ?HENT:  Positive for congestion, postnasal drip, sinus pressure and sore throat. Negative for ear pain.   ?Respiratory:  Positive for cough and shortness of breath.   ?Cardiovascular:  Negative for chest pain and palpitations.  ?Gastrointestinal:  Negative for diarrhea and vomiting.  ?Skin:  Negative for color change and rash.  ?Neurological:  Positive for headaches.  ?All other systems reviewed and are negative. ? ? ?Physical Exam ?Triage Vital Signs ?ED Triage Vitals [11/18/21 1652]  ?Enc Vitals Group  ?   BP 118/75  ?   Pulse Rate 69  ?   Resp 18  ?   Temp 98.2 ?F (36.8 ?C)  ?   Temp src   ?   SpO2 97 %  ?   Weight   ?   Height   ?   Head Circumference   ?   Peak Flow   ?   Pain Score   ?   Pain Loc   ?   Pain Edu?   ?   Excl. in [place]?   ? ?No data found. ? ?Updated Vital Signs ?BP 118/75   Pulse 69   Temp 98.2 ?F (36.8 ?C)   Resp 18   SpO2 97%  ? ?Visual  Acuity ?Right Eye Distance:   ?Left Eye Distance:   ?Bilateral Distance:   ? ?Right Eye Near:   ?Left Eye Near:    ?Bilateral Near:    ? ?Physical Exam ?Vitals and nursing note reviewed.  ?  Constitutional:   ?   General: She is not in acute distress. ?   Appearance: She is well-developed. She is obese.  ?HENT:  ?   Right Ear: Tympanic membrane normal.  ?   Left Ear: Tympanic membrane normal.  ?   Nose: Congestion present.  ?   Mouth/Throat:  ?   Mouth: Mucous membranes are moist.  ?   Pharynx: Oropharynx is clear.  ?Cardiovascular:  ?   Rate and Rhythm: Normal rate and regular rhythm.  ?   Heart sounds: Normal heart sounds.  ?Pulmonary:  ?   Effort: Pulmonary effort is normal. No respiratory distress.  ?   Breath sounds: Normal breath sounds.  ?Musculoskeletal:  ?   Cervical back: Neck supple.  ?Skin: ?   General: Skin is warm and dry.  ?Neurological:  ?   Mental Status: She is alert.  ?Psychiatric:     ?   Mood and Affect: Mood normal.     ?   Behavior: Behavior normal.  ? ? ? ?UC Treatments / Results  ?Labs ?(all labs ordered are listed, but only abnormal results are displayed) ?Labs Reviewed  ?COVID-19, FLU A+B AND RSV  ? ? ?EKG ? ? ?Radiology ?DG Chest 2 View ? ?Result Date: 11/18/2021 ?CLINICAL DATA:  Productive cough.  Shortness of breath.  Body aches. EXAM: CHEST - 2 VIEW COMPARISON:  03/21/2013 FINDINGS: Cervical spine fixation. Lower thoracic spondylosis. Cholecystectomy. Midline trachea. Normal heart size and mediastinal contours. No pleural effusion or pneumothorax. Clear lungs. IMPRESSION: No acute cardiopulmonary disease. Electronically Signed   By: Abigail Miyamoto M.D.   On: 11/18/2021 17:27   ? ?Procedures ?Procedures (including critical care time) ? ?Medications Ordered in UC ?Medications  ?albuterol (VENTOLIN HFA) 108 (90 Base) MCG/ACT inhaler 2 puff (has no administration in time range)  ?AeroChamber Plus Flo-Vu Medium MISC 1 each (has no administration in time range)  ? ? ?Initial Impression /  Assessment and Plan / UC Course  ?I have reviewed the triage vital signs and the nursing notes. ? ?Pertinent labs & imaging results that were available during my care of the patient were reviewed by me and considered

## 2021-11-18 NOTE — ED Triage Notes (Signed)
Pt presents with cough, bodyaches, HA, ST x 4 days. She had a fever 2 days ago.  ?

## 2021-11-18 NOTE — Discharge Instructions (Addendum)
Your chest xray was normal.  The COVID, Flu, and RSV tests are pending.  ? ?Use the albuterol inhaler and take the prednisone as directed.  Follow up with your primary care provider if your symptoms are not improving.   ? ?

## 2021-11-19 LAB — DHEA-SULFATE: DHEA-SO4: 40 ug/dL (ref 9–118)

## 2021-11-19 LAB — VITAMIN D 1,25 DIHYDROXY
Vitamin D 1, 25 (OH)2 Total: 34 pg/mL (ref 18–72)
Vitamin D2 1, 25 (OH)2: <8 pg/mL
Vitamin D3 1, 25 (OH)2: 34 pg/mL

## 2021-11-19 LAB — TESTOS,TOTAL,FREE AND SHBG (FEMALE)
Free Testosterone: 1 pg/mL (ref 0.1–6.4)
Sex Hormone Binding: 58 nmol/L (ref 14–73)
Testosterone, Total, LC-MS-MS: 10 ng/dL (ref 2–45)

## 2021-11-19 NOTE — Telephone Encounter (Signed)
Spoke with pt and she stated that she went to [facility] over the weekend and they are treating her for bronchitis. Pt was advised that if she does not get better to please give the office a call back so we can get her scheduled for an appt.  ?

## 2021-11-20 DIAGNOSIS — E611 Iron deficiency: Secondary | ICD-10-CM | POA: Insufficient documentation

## 2021-11-20 LAB — COVID-19, FLU A+B AND RSV
Influenza A, NAA: NOT DETECTED
Influenza B, NAA: NOT DETECTED
RSV, NAA: NOT DETECTED
SARS-CoV-2, NAA: NOT DETECTED

## 2021-11-20 MED ORDER — IRON (FERROUS SULFATE) 325 (65 FE) MG PO TABS
325.0000 mg | ORAL_TABLET | Freq: Every day | ORAL | 1 refills | Status: AC
  Filled 2021-11-20: qty 90, fill #0
  Filled 2022-02-03: qty 90, 90d supply, fill #0
  Filled 2022-03-14: qty 90, fill #0
  Filled 2022-04-28: qty 90, 90d supply, fill #0

## 2021-11-20 NOTE — Assessment & Plan Note (Signed)
found during screening for hair loss ? ?Lab Results  ?Component Value Date  ? IRON 38 (L) 11/13/2021  ? TIBC 427.0 11/13/2021  ? FERRITIN 16.0 06/21/2014  ? ?Lab Results  ?Component Value Date  ? WBC 6.1 06/15/2021  ? HGB 13.0 06/15/2021  ? HCT 39.0 06/15/2021  ? MCV 88.4 06/15/2021  ? PLT 280 06/15/2021  ? ? ?

## 2021-11-20 NOTE — Addendum Note (Signed)
Addended by: Crecencio Mc on: 11/20/2021 05:27 PM ? ? Modules accepted: Orders ? ?

## 2021-11-21 ENCOUNTER — Other Ambulatory Visit: Payer: Self-pay

## 2021-11-26 ENCOUNTER — Other Ambulatory Visit: Payer: Self-pay

## 2021-12-06 ENCOUNTER — Other Ambulatory Visit (HOSPITAL_COMMUNITY)
Admission: RE | Admit: 2021-12-06 | Discharge: 2021-12-06 | Disposition: A | Discharge status: Home / Self Care | Payer: 59 | Source: Ambulatory Visit | Attending: Internal Medicine | Admitting: Internal Medicine

## 2021-12-06 ENCOUNTER — Other Ambulatory Visit: Payer: Self-pay

## 2021-12-06 ENCOUNTER — Ambulatory Visit: Payer: 59 | Admitting: Internal Medicine

## 2021-12-06 ENCOUNTER — Encounter: Payer: Self-pay | Admitting: Internal Medicine

## 2021-12-06 VITALS — BP 102/68 | HR 60 | Temp 98.1°F | Ht 64.0 in | Wt 200.2 lb

## 2021-12-06 DIAGNOSIS — Z124 Encounter for screening for malignant neoplasm of cervix: Secondary | ICD-10-CM

## 2021-12-06 DIAGNOSIS — E611 Iron deficiency: Secondary | ICD-10-CM

## 2021-12-06 DIAGNOSIS — I10 Essential (primary) hypertension: Secondary | ICD-10-CM

## 2021-12-06 DIAGNOSIS — Z0000 Encounter for general adult medical examination without abnormal findings: Secondary | ICD-10-CM | POA: Diagnosis not present

## 2021-12-06 DIAGNOSIS — E785 Hyperlipidemia, unspecified: Secondary | ICD-10-CM | POA: Diagnosis not present

## 2021-12-06 DIAGNOSIS — L650 Telogen effluvium: Secondary | ICD-10-CM

## 2021-12-06 DIAGNOSIS — R7301 Impaired fasting glucose: Secondary | ICD-10-CM | POA: Diagnosis not present

## 2021-12-06 DIAGNOSIS — R87610 Atypical squamous cells of undetermined significance on cytologic smear of cervix (ASC-US): Secondary | ICD-10-CM

## 2021-12-06 MED ORDER — SEMAGLUTIDE (2 MG/DOSE) 8 MG/3ML ~~LOC~~ SOPN
2.0000 mg | PEN_INJECTOR | SUBCUTANEOUS | 2 refills | Status: DC
  Filled 2021-12-06: qty 3, 28d supply, fill #0

## 2021-12-06 NOTE — Progress Notes (Signed)
The patient is here for annual preventive examination and management of other chronic and acute problems. ?  ?The risk factors are reflected in the social history. ?  ?The roster of all physicians providing medical care to patient - is listed in the Snapshot section of the chart. ?  ?Activities of daily living:  The patient is 100% independent in all ADLs: dressing, toileting, feeding as well as independent mobility ?  ?Home safety : The patient has smoke detectors in the home. They wear seatbelts.  There are no unsecured firearms at home. There is no violence in the home.  ?  ?There is no risks for hepatitis, STDs or HIV. There is no   history of blood transfusion. They have no travel history to infectious disease endemic areas of the world. ?  ?The patient has seen their dentist in the last six month. They have seen their eye doctor in the last year. The patinet  denies slight hearing difficulty with regard to whispered voices and some television programs.  They have deferred audiologic testing in the last year.  They do not  have excessive sun exposure. Discussed the need for sun protection: hats, long sleeves and use of sunscreen if there is significant sun exposure.  ?  ?Diet: the importance of a healthy diet is discussed. They do have a healthy diet. ?  ?The benefits of regular aerobic exercise were discussed. The patient  is walking or riding a stationery bike 3 days per week  for  20 minutes.  ?  ?Depression screen: there are no signs or vegative symptoms of depression- irritability, change in appetite, anhedonia, sadness/tearfullness. ?  ?The following portions of the patient's history were reviewed and updated as appropriate: allergies, current medications, past family history, past medical history,  past surgical history, past social history  and problem list. ?  ?Visual acuity was not assessed per patient preference since the patient has regular follow up with an  ophthalmologist. Hearing and body mass  index were assessed and reviewed.  ?  ?During the course of the visit the patient was educated and counseled about appropriate screening and preventive services including : fall prevention , diabetes screening, nutrition counseling, colorectal cancer screening, and recommended immunizations.   ? ?Chief Complaint: ? ?Need for PAP smear and CPE ? ?Hair loss:  workup reviewed.  ? ?Obesity:  has gained 4 lbs since last visit .  Taking 1 mg ozempic weekly  no nausea.   ?  ? ? ?Review of Symptoms ? ?Patient denies headache, fevers, malaise, unintentional weight loss, skin rash, eye pain, sinus congestion and sinus pain, sore throat, dysphagia,  hemoptysis , cough, dyspnea, wheezing, chest pain, palpitations, orthopnea, edema, abdominal pain, nausea, melena, diarrhea, constipation, flank pain, dysuria, hematuria, urinary  Frequency, nocturia, numbness, tingling, seizures,  Focal weakness, Loss of consciousness,  Tremor, insomnia, depression, anxiety, and suicidal ideation.   ? ?Physical Exam: ? ?BP 102/68 (BP Location: Left Arm, Patient Position: Sitting, Cuff Size: Large)   Pulse 60   Temp 98.1 ?F (36.7 ?C) (Oral)   Ht $R'5\' 4"'zz$  (1.626 m)   Wt 200 lb 3.2 oz (90.8 kg)   SpO2 98%   BMI 34.36 kg/m?   ? ?General Appearance:    Alert, cooperative, no distress, appears stated age  ?Head:    Normocephalic, without obvious abnormality, atraumatic  ?Eyes:    PERRL, conjunctiva/corneas clear, EOM's intact, fundi  ?  benign, both eyes  ?Ears:    Normal TM's and external  ear canals, both ears  ?Nose:   Nares normal, septum midline, mucosa normal, no drainage  ?  or sinus tenderness  ?Throat:   Lips, mucosa, and tongue normal; teeth and gums normal  ?Neck:   Supple, symmetrical, trachea midline, no adenopathy;  ?  thyroid:  no enlargement/tenderness/nodules; no carotid ?  bruit or JVD  ?Back:     Symmetric, no curvature, ROM normal, no CVA tenderness  ?Lungs:     Clear to auscultation bilaterally, respirations unlabored  ?Chest  Wall:    No tenderness or deformity  ? Heart:    Regular rate and rhythm, S1 and S2 normal, no murmur, rub ?  or gallop  ?Breast Exam:    No tenderness, masses, or nipple abnormality  ?Abdomen:     Soft, non-tender, bowel sounds active all four quadrants,  ?  no masses, no organomegaly  ?Genitalia:    Pelvic: cervix normal in appearance, external genitalia normal, no adnexal masses or tenderness, no cervical motion tenderness, rectovaginal septum normal, uterus normal size, shape, and consistency and vagina normal without discharge  ?Extremities:   Extremities normal, atraumatic, no cyanosis or edema  ?Pulses:   2+ and symmetric all extremities  ?Skin:   Skin color, texture, turgor normal, no rashes or lesions  ?Lymph nodes:   Cervical, supraclavicular, and axillary nodes normal  ?Neurologic:   CNII-XII intact, normal strength, sensation and reflexes  ?  throughout  ?  ? ? ?Assessment and Plan: ? ?Morbid obesity ([place]) ?Her weight has increased by 4 lbs since her last visit on  Ozempic.  Currently taking 1 mg weekly,so seh will move to 2 mg weekly dose   ? ?Annual physical exam ?age appropriate education and counseling updated, referrals for preventative services and immunizations addressed, dietary and smoking counseling addressed, most recent labs reviewed.  I have personally reviewed and have noted: ?  ?1) the patient's medical and social history ?2) The pt's use of alcohol, tobacco, and illicit drugs ?3) The patient's current medications and supplements ?4) Functional ability including ADL's, fall risk, home safety risk, hearing and visual impairment ?5) Diet and physical activities ?6) Evidence for depression or mood disorder ?7) The patient's height, weight, and BMI have been recorded in the chart ?  ?I have made referrals, and provided counseling and education based on review of the above ? ?Acute telogen effluvium ? secondary to recent COVID infection (Dec 2022)  And concurrent weight loss (>40 lbs using  Ozempic) ? ? ?Updated Medication List ?Outpatient Encounter Medications as of 12/06/2021  ?Medication Sig  ? aspirin 81 MG tablet Take 81 mg by mouth daily.  ? cyclobenzaprine (FLEXERIL) 5 MG tablet take 1 tablet by mouth at bedtime as needed  ? DANDELION PO Take by mouth daily.  ? Iron, Ferrous Sulfate, 325 (65 Fe) MG TABS Take 325 mg by mouth daily.  ? meloxicam (MOBIC) 15 MG tablet Take 1 tablet every day by oral route with meals.  ? MILK THISTLE PO Take by mouth daily.  ? Semaglutide, 2 MG/DOSE, 8 MG/3ML SOPN Inject 2 mg as directed once a week.  ? telmisartan (MICARDIS) 20 MG tablet TAKE 1 TABLET (20 MG TOTAL) BY MOUTH DAILY.  ? TURMERIC PO Take by mouth daily.  ? VITAMIN D PO Take by mouth.  ? [DISCONTINUED] Semaglutide, 1 MG/DOSE, (OZEMPIC, 1 MG/DOSE,) 4 MG/3ML SOPN Inject 1 mg as directed once a week.  ? [DISCONTINUED] COVID-19 At Home Antigen Test (CARESTART COVID-19 HOME TEST) KIT Use as  directed (Patient not taking: Reported on 12/06/2021)  ? [DISCONTINUED] predniSONE (STERAPRED UNI-PAK 21 TAB) 10 MG (21) TBPK tablet Take by mouth daily. As directed (Patient not taking: Reported on 12/06/2021)  ? ?No facility-administered encounter medications on file as of 12/06/2021.  ? ? ?

## 2021-12-06 NOTE — Assessment & Plan Note (Signed)

## 2021-12-06 NOTE — Patient Instructions (Signed)
I HAVE INCREASED YOUR DOSE OF OZEMPIC TO 2 MG AND SENT IT TO [facility]  ? ?PAP SMEAR WAS DONE TODAY  ?

## 2021-12-06 NOTE — Assessment & Plan Note (Signed)
secondary to recent COVID infection (Dec 2022)  And concurrent weight loss (>40 lbs using Ozempic) ?

## 2021-12-06 NOTE — Assessment & Plan Note (Addendum)
Her weight has increased by 4 lbs since her last visit on  Ozempic.  Currently taking 1 mg weekly,so seh will move to 2 mg weekly dose   ?

## 2021-12-07 LAB — LIPID PANEL
Cholesterol: 154 mg/dL (ref <200)
HDL: 49 mg/dL — ABNORMAL LOW (ref >=50)
LDL Cholesterol (Calc): 86 mg/dL (calc)
Non-HDL Cholesterol (Calc): 105 mg/dL (calc) (ref <130)
Total CHOL/HDL Ratio: 3.1 (calc) (ref <5.0)
Triglycerides: 99 mg/dL (ref <150)

## 2021-12-07 LAB — HEMOGLOBIN A1C
Hgb A1c MFr Bld: 5.1 % of total Hgb (ref <5.7)
Mean Plasma Glucose: 100 mg/dL
eAG (mmol/L): 5.5 mmol/L

## 2021-12-07 LAB — CBC WITH DIFFERENTIAL/PLATELET
Absolute Monocytes: 440 cells/uL (ref 200–950)
Basophils Absolute: 61 cells/uL (ref 0–200)
Basophils Relative: 1.1 %
Eosinophils Absolute: 132 cells/uL (ref 15–500)
Eosinophils Relative: 2.4 %
HCT: 37.4 % (ref 35.0–45.0)
Hemoglobin: 12.7 g/dL (ref 11.7–15.5)
Lymphs Abs: 1859 cells/uL (ref 850–3900)
MCH: 30.7 pg (ref 27.0–33.0)
MCHC: 34 g/dL (ref 32.0–36.0)
MCV: 90.3 fL (ref 80.0–100.0)
MPV: 8.9 fL (ref 7.5–12.5)
Monocytes Relative: 8 %
Neutro Abs: 3009 cells/uL (ref 1500–7800)
Neutrophils Relative %: 54.7 %
Platelets: 311 10*3/uL (ref 140–400)
RBC: 4.14 10*6/uL (ref 3.80–5.10)
RDW: 12.6 % (ref 11.0–15.0)
Total Lymphocyte: 33.8 %
WBC: 5.5 10*3/uL (ref 3.8–10.8)

## 2021-12-07 LAB — COMPREHENSIVE METABOLIC PANEL
AG Ratio: 1.9 (calc) (ref 1.0–2.5)
ALT: 21 U/L (ref 6–29)
AST: 16 U/L (ref 10–35)
Albumin: 4 g/dL (ref 3.6–5.1)
Alkaline phosphatase (APISO): 57 U/L (ref 37–153)
BUN: 12 mg/dL (ref 7–25)
CO2: 26 mmol/L (ref 20–32)
Calcium: 9.1 mg/dL (ref 8.6–10.4)
Chloride: 102 mmol/L (ref 98–110)
Creat: 0.79 mg/dL (ref 0.50–1.05)
Globulin: 2.1 g/dL (calc) (ref 1.9–3.7)
Glucose, Bld: 87 mg/dL (ref 65–99)
Potassium: 3.7 mmol/L (ref 3.5–5.3)
Sodium: 138 mmol/L (ref 135–146)
Total Bilirubin: 0.5 mg/dL (ref 0.2–1.2)
Total Protein: 6.1 g/dL (ref 6.1–8.1)

## 2021-12-07 LAB — MICROALBUMIN / CREATININE URINE RATIO
Creatinine, Urine: 64 mg/dL (ref 20–275)
Microalb Creat Ratio: 3 mcg/mg creat (ref <30)
Microalb, Ur: 0.2 mg/dL

## 2021-12-07 LAB — LDL CHOLESTEROL, DIRECT: Direct LDL: 92 mg/dL (ref <100)

## 2021-12-08 ENCOUNTER — Encounter: Payer: Self-pay | Admitting: Internal Medicine

## 2021-12-09 ENCOUNTER — Other Ambulatory Visit: Payer: Self-pay

## 2021-12-09 ENCOUNTER — Other Ambulatory Visit: Payer: Self-pay | Admitting: Internal Medicine

## 2021-12-09 MED ORDER — SEMAGLUTIDE-WEIGHT MANAGEMENT 1.7 MG/0.75ML ~~LOC~~ SOAJ
1.7000 mg | SUBCUTANEOUS | 2 refills | Status: DC
  Filled 2021-12-09: qty 3, 28d supply, fill #0

## 2021-12-10 ENCOUNTER — Other Ambulatory Visit: Payer: Self-pay

## 2021-12-10 MED ORDER — SEMAGLUTIDE-WEIGHT MANAGEMENT 1.7 MG/0.75ML ~~LOC~~ SOAJ
1.7000 mg | SUBCUTANEOUS | 2 refills | Status: DC
  Filled 2021-12-10: qty 3, 28d supply, fill #0

## 2021-12-10 NOTE — Telephone Encounter (Signed)
I have already sent in wegovy,  on april 21.  Please call [facility] confirm 1.7 mg wegovy dose received  ?

## 2021-12-10 NOTE — Telephone Encounter (Signed)
PA has been submitted on covermymeds.  

## 2021-12-11 ENCOUNTER — Encounter: Payer: Self-pay | Admitting: Internal Medicine

## 2021-12-11 ENCOUNTER — Other Ambulatory Visit: Payer: Self-pay

## 2021-12-11 ENCOUNTER — Telehealth: Payer: Self-pay | Admitting: Gastroenterology

## 2021-12-11 ENCOUNTER — Telehealth: Payer: Self-pay

## 2021-12-11 DIAGNOSIS — Z800 Family history of malignant neoplasm of digestive organs: Secondary | ICD-10-CM

## 2021-12-11 MED ORDER — NA SULFATE-K SULFATE-MG SULF 17.5-3.13-1.6 GM/177ML PO SOLN
1.0000 | Freq: Once | ORAL | 0 refills | Status: AC
  Filled 2021-12-11 – 2022-05-16 (×2): qty 354, 1d supply, fill #0

## 2021-12-11 NOTE — Telephone Encounter (Signed)
Gastroenterology Pre-Procedure Review ? ?Request Date: 04/07/22 ?Requesting Physician: Dr. Allen Norris ? ?PATIENT REVIEW QUESTIONS: The patient responded to the following health history questions as indicated:   ? ?1. Are you having any GI issues? no ?2. Do you have a personal history of Polyps? no ?3. Do you have a family history of Colon Cancer or Polyps? yes (mother colon cancer) ?4. Diabetes Mellitus? no ?5. Joint replacements in the past 12 months?no ?6. Major health problems in the past 3 months?no ?7. Any artificial heart valves, MVP, or defibrillator?no ?   ?MEDICATIONS & ALLERGIES:    ?Patient reports the following regarding taking any anticoagulation/antiplatelet therapy:   ?Plavix, Coumadin, Eliquis, Xarelto, Lovenox, Pradaxa, Brilinta, or Effient? no ?Aspirin? yes (81 mg daily) ? ?Patient confirms/reports the following medications:  ?Current Outpatient Medications  ?Medication Sig Dispense Refill  ? aspirin 81 MG tablet Take 81 mg by mouth daily.    ? cyclobenzaprine (FLEXERIL) 5 MG tablet take 1 tablet by mouth at bedtime as needed 30 tablet 0  ? DANDELION PO Take by mouth daily.    ? Iron, Ferrous Sulfate, 325 (65 Fe) MG TABS Take 325 mg by mouth daily. 90 tablet 1  ? meloxicam (MOBIC) 15 MG tablet Take 1 tablet every day by oral route with meals. 30 tablet 0  ? MILK THISTLE PO Take by mouth daily.    ? [START ON 03/07/2022] Semaglutide-Weight Management 1.7 MG/0.75ML SOAJ Inject 1.7 mg into the skin once a week for 28 days. 3 mL 2  ? telmisartan (MICARDIS) 20 MG tablet TAKE 1 TABLET (20 MG TOTAL) BY MOUTH DAILY. 90 tablet 1  ? TURMERIC PO Take by mouth daily.    ? VITAMIN D PO Take by mouth.    ? ?No current facility-administered medications for this visit.  ? ? ?Patient confirms/reports the following allergies:  ?Allergies  ?Allergen Reactions  ? Amoxicillin Hives  ? ? ?No orders of the defined types were placed in this encounter. ? ? ?AUTHORIZATION INFORMATION ?Primary Insurance: ?1D#: ?Group  #: ? ?Secondary Insurance: ?1D#: ?Group #: ? ?SCHEDULE INFORMATION: ?Date:  ?Time: ?Location:  ?

## 2021-12-11 NOTE — Telephone Encounter (Signed)
Patient calling to schedule repeat colonoscopy from 5 years ago with Dr Allen Norris. Requesting procedure be done at [facility]. ?

## 2021-12-12 LAB — CYTOLOGY - PAP
Comment: NEGATIVE
Diagnosis: UNDETERMINED — AB
High risk HPV: NEGATIVE

## 2021-12-13 ENCOUNTER — Encounter: Payer: Self-pay | Admitting: Internal Medicine

## 2021-12-13 DIAGNOSIS — R87610 Atypical squamous cells of undetermined significance on cytologic smear of cervix (ASC-US): Secondary | ICD-10-CM | POA: Insufficient documentation

## 2021-12-18 ENCOUNTER — Other Ambulatory Visit: Payer: Self-pay

## 2021-12-18 MED ORDER — SEMAGLUTIDE-WEIGHT MANAGEMENT 0.25 MG/0.5ML ~~LOC~~ SOAJ
0.2500 mg | SUBCUTANEOUS | 0 refills | Status: DC
  Filled 2021-12-18 – 2022-01-03 (×3): qty 2, 28d supply, fill #0

## 2021-12-19 ENCOUNTER — Other Ambulatory Visit: Payer: Self-pay

## 2021-12-23 ENCOUNTER — Other Ambulatory Visit: Payer: Self-pay

## 2021-12-26 ENCOUNTER — Other Ambulatory Visit: Payer: Self-pay

## 2021-12-26 ENCOUNTER — Telehealth: Payer: Self-pay

## 2021-12-26 NOTE — Telephone Encounter (Signed)
PA for Wegovy 0.25 has been submitted on covermymeds.  ?

## 2021-12-27 DIAGNOSIS — G4733 Obstructive sleep apnea (adult) (pediatric): Secondary | ICD-10-CM | POA: Diagnosis not present

## 2022-01-03 ENCOUNTER — Other Ambulatory Visit: Payer: Self-pay

## 2022-01-03 NOTE — Telephone Encounter (Signed)
PA for Jessica Clayton has been approved. Sent pt a mychart message letting her know.

## 2022-01-06 ENCOUNTER — Other Ambulatory Visit: Payer: Self-pay

## 2022-01-10 ENCOUNTER — Other Ambulatory Visit: Payer: Self-pay

## 2022-02-03 ENCOUNTER — Other Ambulatory Visit: Payer: Self-pay

## 2022-02-03 ENCOUNTER — Other Ambulatory Visit: Payer: Self-pay | Admitting: Internal Medicine

## 2022-02-03 MED ORDER — WEGOVY 0.25 MG/0.5ML ~~LOC~~ SOAJ
0.5000 mg | SUBCUTANEOUS | 3 refills | Status: AC
  Filled 2022-02-03: qty 2, fill #0
  Filled 2022-02-19: qty 2, 28d supply, fill #0
  Filled 2022-03-14 – 2022-03-18 (×2): qty 2, 28d supply, fill #1

## 2022-02-05 DIAGNOSIS — M1712 Unilateral primary osteoarthritis, left knee: Secondary | ICD-10-CM | POA: Diagnosis not present

## 2022-02-06 ENCOUNTER — Other Ambulatory Visit: Payer: Self-pay

## 2022-02-19 ENCOUNTER — Other Ambulatory Visit: Payer: Self-pay

## 2022-03-15 ENCOUNTER — Encounter: Payer: Self-pay | Admitting: Internal Medicine

## 2022-03-16 ENCOUNTER — Other Ambulatory Visit: Payer: Self-pay

## 2022-03-17 ENCOUNTER — Other Ambulatory Visit: Payer: Self-pay

## 2022-03-18 ENCOUNTER — Other Ambulatory Visit: Payer: Self-pay

## 2022-03-18 ENCOUNTER — Other Ambulatory Visit: Payer: Self-pay | Admitting: Internal Medicine

## 2022-03-18 MED ORDER — SEMAGLUTIDE-WEIGHT MANAGEMENT 1 MG/0.5ML ~~LOC~~ SOAJ
1.0000 mg | SUBCUTANEOUS | 2 refills | Status: DC
  Filled 2022-03-18: qty 2, 28d supply, fill #0

## 2022-03-18 MED ORDER — SEMAGLUTIDE-WEIGHT MANAGEMENT 0.5 MG/0.5ML ~~LOC~~ SOAJ
0.5000 mg | SUBCUTANEOUS | 2 refills | Status: AC
  Filled 2022-03-18: qty 2, 28d supply, fill #0
  Filled 2022-04-25: qty 2, 28d supply, fill #1

## 2022-03-31 NOTE — Anesthesia Preprocedure Evaluation (Addendum)
Anesthesia Evaluation  Patient identified by MRN, date of birth, ID band Patient awake    Reviewed: Allergy & Precautions, NPO status , Patient's Chart, lab work & pertinent test results  History of Anesthesia Complications (+) history of anesthetic complications (sats dropped during cataract surgery)  Airway Mallampati: III  TM Distance: >3 FB Neck ROM: full    Dental  (+) Chipped   Pulmonary sleep apnea and Continuous Positive Airway Pressure Ventilation ,    Pulmonary exam normal        Cardiovascular hypertension, + dysrhythmias (Hx a fib 2/2 abnormal potassium level) Atrial Fibrillation   H/O Chest pain work up- Normal Lexiscan  normal enzymes during June 2013 admission for chest pain ([place])  Leg swelling   Neuro/Psych  Headaches, Neuropathy negative psych ROS   GI/Hepatic negative GI ROS, Hepatic steatosis   Endo/Other    Renal/GU negative Renal ROS  negative genitourinary   Musculoskeletal   Abdominal (+) + obese,   Peds  Hematology negative hematology ROS (+)   Anesthesia Other Findings Laryngospasm  Past Medical History: No date: Arthritis     Comment:  left shoulder, injected by Margaretmary Eddy 2012 05/08/2015: Chest pain 02/05/2012: Chest pain at rest     Comment:  Normal Lexiscan  normal enzymes during June 2013               admission for chest pain ([place]) No date: Complication of anesthesia     Comment:  Sats dropped during cataract surgery No date: Hyperlipidemia     Comment:  hypertriglyceridemia No date: Hypertension No date: Menopause, premature     Comment:  at age 61 No date: Obesity (BMI 30-39.9) No date: PAF (paroxysmal atrial fibrillation) ([facility])     Comment:  R/T low potassium No date: Sleep apnea     Comment:  CPAP  Past Surgical History: No date: CATARACT EXTRACTION W/ INTRAOCULAR LENS  IMPLANT, BILATERAL 1982: CHOLECYSTECTOMY 04/06/2017: COLONOSCOPY WITH PROPOFOL; N/A      Comment:  Procedure: COLONOSCOPY WITH PROPOFOL;  Surgeon: Lucilla Lame, MD;  Location: [facility];  Service:               Gastroenterology;  Laterality: N/A;  sleep apnea No date: FOOT SURGERY; Right     Comment:  tumor No date: herniated disc No date: TUBAL LIGATION     Reproductive/Obstetrics negative OB ROS                           Anesthesia Physical Anesthesia Plan  ASA: 3  Anesthesia Plan: General   Post-op Pain Management:    Induction: Intravenous  PONV Risk Score and Plan: Treatment may vary due to age or medical condition, Propofol infusion and TIVA  Airway Management Planned: Natural Airway  Additional Equipment:   Intra-op Plan:   Post-operative Plan:   Informed Consent: I have reviewed the patients History and Physical, chart, labs and discussed the procedure including the risks, benefits and alternatives for the proposed anesthesia with the patient or authorized representative who has indicated his/her understanding and acceptance.     Dental Advisory Given  Plan Discussed with: Anesthesiologist, CRNA and Surgeon  Anesthesia Plan Comments: (Patient consented for risks of anesthesia including but not limited to:  - adverse reactions to medications - risk of airway placement if required - damage to eyes, teeth, lips or other oral mucosa -  nerve damage due to positioning  - sore throat or hoarseness - Damage to heart, brain, nerves, lungs, other parts of body or loss of life  Patient voiced understanding.)      Anesthesia Quick Evaluation

## 2022-04-11 DIAGNOSIS — G4733 Obstructive sleep apnea (adult) (pediatric): Secondary | ICD-10-CM | POA: Diagnosis not present

## 2022-04-18 ENCOUNTER — Ambulatory Visit: Payer: 59 | Admitting: Internal Medicine

## 2022-04-25 ENCOUNTER — Other Ambulatory Visit: Payer: Self-pay

## 2022-04-28 ENCOUNTER — Other Ambulatory Visit: Payer: Self-pay | Admitting: Internal Medicine

## 2022-04-28 ENCOUNTER — Other Ambulatory Visit: Payer: Self-pay

## 2022-04-29 ENCOUNTER — Other Ambulatory Visit: Payer: Self-pay

## 2022-04-29 MED ORDER — TELMISARTAN 20 MG PO TABS
ORAL_TABLET | Freq: Every day | ORAL | 1 refills | Status: DC
  Filled 2022-04-29: qty 90, 90d supply, fill #0
  Filled 2022-11-04 (×2): qty 90, 90d supply, fill #1

## 2022-05-14 ENCOUNTER — Encounter: Payer: Self-pay | Admitting: Gastroenterology

## 2022-05-16 ENCOUNTER — Other Ambulatory Visit: Payer: Self-pay

## 2022-05-21 ENCOUNTER — Encounter: Payer: Self-pay | Admitting: Internal Medicine

## 2022-05-21 ENCOUNTER — Other Ambulatory Visit: Payer: Self-pay

## 2022-05-21 ENCOUNTER — Ambulatory Visit: Payer: 59 | Admitting: Internal Medicine

## 2022-05-21 VITALS — BP 126/70 | HR 59 | Temp 98.1°F | Ht 64.0 in | Wt 206.6 lb

## 2022-05-21 DIAGNOSIS — I10 Essential (primary) hypertension: Secondary | ICD-10-CM

## 2022-05-21 DIAGNOSIS — Z79899 Other long term (current) drug therapy: Secondary | ICD-10-CM

## 2022-05-21 DIAGNOSIS — R7301 Impaired fasting glucose: Secondary | ICD-10-CM

## 2022-05-21 DIAGNOSIS — E785 Hyperlipidemia, unspecified: Secondary | ICD-10-CM | POA: Diagnosis not present

## 2022-05-21 DIAGNOSIS — Z1211 Encounter for screening for malignant neoplasm of colon: Secondary | ICD-10-CM | POA: Diagnosis not present

## 2022-05-21 MED ORDER — ZOSTER VAC RECOMB ADJUVANTED 50 MCG/0.5ML IM SUSR: 0.5000 mL | Freq: Once | INTRAMUSCULAR | 1 refills | Status: DC

## 2022-05-21 MED ORDER — ZOSTER VAC RECOMB ADJUVANTED 50 MCG/0.5ML IM SUSR
0.5000 mL | Freq: Once | INTRAMUSCULAR | 1 refills | Status: AC
  Filled 2022-05-21: qty 1, 1d supply, fill #0

## 2022-05-21 NOTE — Progress Notes (Unsigned)
Subjective:  Patient ID: Jessica Clayton, female    DOB: 08-Apr-1961  Age: 61 y.o. MRN: [id number]  CC: The primary encounter diagnosis was Screening for colon cancer. Diagnoses of Hyperlipidemia with target LDL less than 100, Primary hypertension, Impaired fasting blood sugar, and Long-term use of high-risk medication were also pertinent to this visit.   HPI Jessica Clayton presents for  Chief Complaint  Patient presents with   Follow-up    Discuss shingles vaccine   Follow up on obesity .  Taking wegovy  0.25 ( chart says 0.5 mg)  without  side  effects.   Gained 9 lbs during  suspension due to insurance issues,  has lost 3 with restart byt Weight loss has plateaued .  She is walking 2 times weekly at the [facility] gym  Colonoscopy on Friday with Dr   Hypertension: patient checks blood pressure twice weekly at home.  Readings have been for the most part < 130/80 at rest . Patient is following a reduce salt diet most days and is taking medications as prescribed    Outpatient Medications Prior to Visit  Medication Sig Dispense Refill   aspirin 81 MG tablet Take 81 mg by mouth daily.     cyclobenzaprine (FLEXERIL) 5 MG tablet take 1 tablet by mouth at bedtime as needed 30 tablet 0   DANDELION PO Take by mouth daily.     Iron, Ferrous Sulfate, 325 (65 Fe) MG TABS Take 325 mg by mouth daily. 90 tablet 1   meloxicam (MOBIC) 15 MG tablet Take 1 tablet every day by oral route with meals. 30 tablet 0   MILK THISTLE PO Take by mouth daily.     Semaglutide-Weight Management 0.5 MG/0.5ML SOAJ Inject 0.5 mg into the skin once a week for 28 days. (Patient taking differently: Inject 0.5 mg into the skin once a week.  Friday evenings) 2 mL 2   TURMERIC PO Take by mouth daily.     VITAMIN D PO Take by mouth.     telmisartan (MICARDIS) 20 MG tablet TAKE 1 TABLET (20 MG TOTAL) BY MOUTH DAILY. (Patient not taking: Reported on 05/21/2022) 90 tablet 1   No facility-administered medications prior  to visit.    Review of Systems;  Patient denies headache, fevers, malaise, unintentional weight loss, skin rash, eye pain, sinus congestion and sinus pain, sore throat, dysphagia,  hemoptysis , cough, dyspnea, wheezing, chest pain, palpitations, orthopnea, edema, abdominal pain, nausea, melena, diarrhea, constipation, flank pain, dysuria, hematuria, urinary  Frequency, nocturia, numbness, tingling, seizures,  Focal weakness, Loss of consciousness,  Tremor, insomnia, depression, anxiety, and suicidal ideation.      Objective:  BP 126/70 (BP Location: Left Arm, Patient Position: Sitting, Cuff Size: Large)   Pulse (!) 59   Temp 98.1 F (36.7 C) (Oral)   Ht '5\' 4"'$  (1.626 m)   Wt 206 lb 9.6 oz (93.7 kg)   SpO2 97%   BMI 35.46 kg/m   BP Readings from Last 3 Encounters:  05/21/22 126/70  12/06/21 102/68  11/18/21 118/75    Wt Readings from Last 3 Encounters:  05/21/22 206 lb 9.6 oz (93.7 kg)  12/06/21 200 lb 3.2 oz (90.8 kg)  11/13/21 197 lb 3.2 oz (89.4 kg)    General appearance: alert, cooperative and appears stated age Ears: normal TM's and external ear canals both ears Throat: lips, mucosa, and tongue normal; teeth and gums normal Neck: no adenopathy, no carotid bruit, supple, symmetrical, trachea midline and thyroid  not enlarged, symmetric, no tenderness/mass/nodules Back: symmetric, no curvature. ROM normal. No CVA tenderness. Lungs: clear to auscultation bilaterally Heart: regular rate and rhythm, S1, S2 normal, no murmur, click, rub or gallop Abdomen: soft, non-tender; bowel sounds normal; no masses,  no organomegaly Pulses: 2+ and symmetric Skin: Skin color, texture, turgor normal. No rashes or lesions Lymph nodes: Cervical, supraclavicular, and axillary nodes normal. Neuro:  awake and interactive with normal mood and affect. Higher cortical functions are normal. Speech is clear without word-finding difficulty or dysarthria. Extraocular movements are intact. Visual fields  of both eyes are grossly intact. Sensation to light touch is grossly intact bilaterally of upper and lower extremities. Motor examination shows 4+/5 symmetric hand grip and upper extremity and 5/5 lower extremity strength. There is no pronation or drift. Gait is non-ataxic   Lab Results  Component Value Date   HGBA1C 5.1 12/06/2021   HGBA1C 5.7 04/11/2021   HGBA1C 5.7 05/09/2020    Lab Results  Component Value Date   CREATININE 0.79 12/06/2021   CREATININE 0.71 07/25/2021   CREATININE 0.67 06/15/2021    Lab Results  Component Value Date   WBC 5.5 12/06/2021   HGB 12.7 12/06/2021   HCT 37.4 12/06/2021   PLT 311 12/06/2021   GLUCOSE 87 12/06/2021   CHOL 154 12/06/2021   TRIG 99 12/06/2021   HDL 49 (L) 12/06/2021   LDLDIRECT 92 12/06/2021   LDLCALC 86 12/06/2021   ALT 21 12/06/2021   AST 16 12/06/2021   NA 138 12/06/2021   K 3.7 12/06/2021   CL 102 12/06/2021   CREATININE 0.79 12/06/2021   BUN 12 12/06/2021   CO2 26 12/06/2021   TSH 1.98 11/13/2021   HGBA1C 5.1 12/06/2021   MICROALBUR 0.2 12/06/2021    No results found.  Assessment & Plan:   Problem List Items Addressed This Visit     Screening for colon cancer - Primary   Hypertension   Relevant Orders   Comprehensive metabolic panel   Hyperlipidemia with target LDL less than 100   Relevant Orders   Lipid panel   LDL cholesterol, direct   Other Visit Diagnoses     Impaired fasting blood sugar       Relevant Orders   Hemoglobin A1c   Long-term use of high-risk medication       Relevant Orders   TSH       I spent a total of   minutes with this patient in a face to face visit on the date of this encounter reviewing the last office visit with me in       ,  most recent visit with cardiology ,    ,  patient's diet and exercise habits, home blood pressure /blod sugar readings, recent ER visit including labs and imaging studies ,   and post visit ordering of testing and therapeutics.    Follow-up: No  follow-ups on file.   Crecencio Mc, MD

## 2022-05-21 NOTE — Patient Instructions (Signed)
Time to increase the [employer] [facility] [employer] dose.  Let me know what dose you have been taking   Shingrix rx has been sent to [facility]

## 2022-05-22 LAB — LIPID PANEL
Cholesterol: 149 mg/dL (ref 0–200)
HDL: 53.5 mg/dL (ref >39.00)
LDL Cholesterol: 78 mg/dL (ref 0–99)
NonHDL: 95.93
Total CHOL/HDL Ratio: 3
Triglycerides: 91 mg/dL (ref 0.0–149.0)
VLDL: 18.2 mg/dL (ref 0.0–40.0)

## 2022-05-22 LAB — TSH: TSH: 1.82 u[IU]/mL (ref 0.35–5.50)

## 2022-05-22 LAB — COMPREHENSIVE METABOLIC PANEL
ALT: 16 U/L (ref 0–35)
AST: 16 U/L (ref 0–37)
Albumin: 4.2 g/dL (ref 3.5–5.2)
Alkaline Phosphatase: 66 U/L (ref 39–117)
BUN: 10 mg/dL (ref 6–23)
CO2: 28 mEq/L (ref 19–32)
Calcium: 9.3 mg/dL (ref 8.4–10.5)
Chloride: 100 mEq/L (ref 96–112)
Creatinine, Ser: 0.73 mg/dL (ref 0.40–1.20)
GFR: 88.49 mL/min (ref >60.00)
Glucose, Bld: 95 mg/dL (ref 70–99)
Potassium: 3.5 mEq/L (ref 3.5–5.1)
Sodium: 136 mEq/L (ref 135–145)
Total Bilirubin: 0.7 mg/dL (ref 0.2–1.2)
Total Protein: 6.9 g/dL (ref 6.0–8.3)

## 2022-05-22 LAB — LDL CHOLESTEROL, DIRECT: Direct LDL: 93 mg/dL

## 2022-05-22 LAB — HEMOGLOBIN A1C: Hgb A1c MFr Bld: 5.1 % (ref 4.6–6.5)

## 2022-05-22 NOTE — Assessment & Plan Note (Signed)
Well controlled on current regimen. Renal function stable, no changes today.  Lab Results  Component Value Date   CREATININE 0.73 05/21/2022   Lab Results  Component Value Date   NA 136 05/21/2022   K 3.5 05/21/2022   CL 100 05/21/2022   CO2 28 05/21/2022

## 2022-05-22 NOTE — Assessment & Plan Note (Addendum)
She regained weight during an unintended lapse in GLP1 agonist therapy;  Had to restart at lowest dose due to length of therapy suspension .  Encouraged to increase exercise

## 2022-05-22 NOTE — Assessment & Plan Note (Signed)
Her cholesterol remains low with  dietary changes.  No medication indicated.   Lab Results  Component Value Date   CHOL 149 05/21/2022   HDL 53.50 05/21/2022   LDLCALC 78 05/21/2022   LDLDIRECT 93.0 05/21/2022   TRIG 91.0 05/21/2022   CHOLHDL 3 05/21/2022

## 2022-05-23 ENCOUNTER — Encounter: Payer: Self-pay | Admitting: Gastroenterology

## 2022-05-23 ENCOUNTER — Ambulatory Visit
Admission: RE | Admit: 2022-05-23 | Discharge: 2022-05-23 | Disposition: A | Discharge status: Home / Self Care | Payer: 59 | Attending: Gastroenterology | Admitting: Gastroenterology

## 2022-05-23 ENCOUNTER — Ambulatory Visit
Admission: RE | Disposition: A | Discharge status: Home / Self Care | Payer: Self-pay | Source: Home / Self Care | Attending: Gastroenterology

## 2022-05-23 ENCOUNTER — Other Ambulatory Visit: Payer: Self-pay

## 2022-05-23 ENCOUNTER — Ambulatory Visit: Payer: 59 | Admitting: Anesthesiology

## 2022-05-23 DIAGNOSIS — K635 Polyp of colon: Secondary | ICD-10-CM | POA: Diagnosis not present

## 2022-05-23 DIAGNOSIS — Z1211 Encounter for screening for malignant neoplasm of colon: Secondary | ICD-10-CM

## 2022-05-23 DIAGNOSIS — K5730 Diverticulosis of large intestine without perforation or abscess without bleeding: Secondary | ICD-10-CM | POA: Diagnosis not present

## 2022-05-23 DIAGNOSIS — G4730 Sleep apnea, unspecified: Secondary | ICD-10-CM | POA: Insufficient documentation

## 2022-05-23 DIAGNOSIS — Z6834 Body mass index (BMI) 34.0-34.9, adult: Secondary | ICD-10-CM | POA: Diagnosis not present

## 2022-05-23 DIAGNOSIS — Z800 Family history of malignant neoplasm of digestive organs: Secondary | ICD-10-CM | POA: Insufficient documentation

## 2022-05-23 DIAGNOSIS — D126 Benign neoplasm of colon, unspecified: Secondary | ICD-10-CM | POA: Diagnosis not present

## 2022-05-23 DIAGNOSIS — I10 Essential (primary) hypertension: Secondary | ICD-10-CM | POA: Insufficient documentation

## 2022-05-23 DIAGNOSIS — Z9049 Acquired absence of other specified parts of digestive tract: Secondary | ICD-10-CM | POA: Insufficient documentation

## 2022-05-23 DIAGNOSIS — Z6835 Body mass index (BMI) 35.0-35.9, adult: Secondary | ICD-10-CM | POA: Diagnosis not present

## 2022-05-23 DIAGNOSIS — I4891 Unspecified atrial fibrillation: Secondary | ICD-10-CM | POA: Diagnosis not present

## 2022-05-23 DIAGNOSIS — E669 Obesity, unspecified: Secondary | ICD-10-CM | POA: Insufficient documentation

## 2022-05-23 DIAGNOSIS — D123 Benign neoplasm of transverse colon: Secondary | ICD-10-CM | POA: Diagnosis not present

## 2022-05-23 DIAGNOSIS — K640 First degree hemorrhoids: Secondary | ICD-10-CM | POA: Insufficient documentation

## 2022-05-23 HISTORY — PX: COLONOSCOPY WITH PROPOFOL: SHX5780

## 2022-05-23 SURGERY — COLONOSCOPY WITH PROPOFOL
Anesthesia: General | Site: Rectum

## 2022-05-23 MED ORDER — PROPOFOL 10 MG/ML IV BOLUS
INTRAVENOUS | Status: DC | PRN
  Administered 2022-05-23: 80 mg via INTRAVENOUS
  Administered 2022-05-23: 40 mg via INTRAVENOUS
  Administered 2022-05-23: 30 mg via INTRAVENOUS
  Administered 2022-05-23: 50 mg via INTRAVENOUS

## 2022-05-23 MED ORDER — SODIUM CHLORIDE 0.9 % IV SOLN: INTRAVENOUS | Status: DC

## 2022-05-23 MED ORDER — STERILE WATER FOR IRRIGATION IR SOLN
Status: DC | PRN
  Administered 2022-05-23: 150 mL

## 2022-05-23 MED ORDER — LIDOCAINE HCL (CARDIAC) PF 100 MG/5ML IV SOSY
PREFILLED_SYRINGE | INTRAVENOUS | Status: DC | PRN
  Administered 2022-05-23: 40 mg via INTRAVENOUS

## 2022-05-23 MED ORDER — LACTATED RINGERS IV SOLN: INTRAVENOUS | Status: DC

## 2022-05-23 SURGICAL SUPPLY — 21 items

## 2022-05-23 NOTE — H&P (Signed)
Lucilla Lame, MD [address] [place], [place] [address] Phone:[phone] Fax : [phone]  Primary Care Physician:  Crecencio Mc, MD Primary Gastroenterologist:  Dr. Allen Norris  Pre-Procedure History & Physical: HPI:  Jessica Clayton is a 61 y.o. female is here for an colonoscopy.   Past Medical History:  Diagnosis Date   Arthritis    left shoulder, injected by Margaretmary Eddy 2012   Chest pain 05/08/2015   Chest pain at rest 02/05/2012   Normal Lexiscan  normal enzymes during June 2013 admission for chest pain ([facility])   Complication of anesthesia    Sats dropped during cataract surgery   Hyperlipidemia    hypertriglyceridemia   Hypertension    Menopause, premature    at age 38   Obesity (BMI 30-39.9)    PAF (paroxysmal atrial fibrillation) (HCC)    R/T low potassium   Sleep apnea    CPAP    Past Surgical History:  Procedure Laterality Date   CATARACT EXTRACTION W/ INTRAOCULAR LENS  IMPLANT, BILATERAL     CHOLECYSTECTOMY  1982   COLONOSCOPY WITH PROPOFOL N/A 04/06/2017   Procedure: COLONOSCOPY WITH PROPOFOL;  Surgeon: Lucilla Lame, MD;  Location: [facility];  Service: Gastroenterology;  Laterality: N/A;  sleep apnea   FOOT SURGERY Right    tumor   herniated disc     TUBAL LIGATION      Prior to Admission medications   Medication Sig Start Date End Date Taking? Authorizing Provider  aspirin 81 MG tablet Take 81 mg by mouth daily.   Yes [provider]  cyclobenzaprine (FLEXERIL) 5 MG tablet take 1 tablet by mouth at bedtime as needed 12/13/20  Yes   DANDELION PO Take by mouth daily.   Yes [provider]  Iron, Ferrous Sulfate, 325 (65 Fe) MG TABS Take 325 mg by mouth daily. 11/20/21  Yes Crecencio Mc, MD  meloxicam (MOBIC) 15 MG tablet Take 1 tablet every day by oral route with meals. 06/25/21  Yes   MILK THISTLE PO Take by mouth daily.   Yes [provider]  Semaglutide-Weight Management 0.5 MG/0.5ML SOAJ  Inject 0.5 mg into the skin once a week for 28 days. Patient taking differently: Inject 0.5 mg into the skin once a week.  Friday evenings 04/16/22 05/23/22 Yes Crecencio Mc, MD  TURMERIC PO Take by mouth daily.   Yes [provider]  VITAMIN D PO Take by mouth.   Yes [provider]  telmisartan (MICARDIS) 20 MG tablet TAKE 1 TABLET (20 MG TOTAL) BY MOUTH DAILY. Patient not taking: Reported on 05/21/2022 04/29/22 04/29/23  Crecencio Mc, MD    Allergies as of 12/11/2021 - Review Complete 12/06/2021  Allergen Reaction Noted   Amoxicillin Hives 10/05/2018    Family History  Problem Relation Age of Onset   Cancer Mother    Alcohol abuse Mother    Cancer Father    COPD Father    Alcohol abuse Father    Heart disease Father    Hypertension Father    Hyperlipidemia Father    Early death Sister    COPD Sister    Heart disease Sister    Breast cancer Neg Hx     Social History   Socioeconomic History   Marital status: Divorced    Spouse name: Not on file   Number of children: Not on file   Years of education: Not on file   Highest education level: Not  on file  Occupational History   Not on file  Tobacco Use   Smoking status: Never   Smokeless tobacco: Never  Vaping Use   Vaping Use: Never used  Substance and Sexual Activity   Alcohol use: No   Drug use: No   Sexual activity: Not on file  Other Topics Concern   Not on file  Social History Narrative   Not on file   Social Determinants of Health   Financial Resource Strain: Not on file  Food Insecurity: Not on file  Transportation Needs: Not on file  Physical Activity: Not on file  Stress: Not on file  Social Connections: Not on file  Intimate Partner Violence: Not on file    Review of Systems: See HPI, otherwise negative ROS  Physical Exam: BP 129/63   Pulse (!) 47   Temp 97.9 F (36.6 C) (Temporal)   Resp 18   Ht '5\' 4"'$  (1.626 m)   Wt 92.1 kg   SpO2 99%   BMI 34.84 kg/m  General:    Alert,  pleasant and cooperative in NAD Head:  Normocephalic and atraumatic. Neck:  Supple; no masses or thyromegaly. Lungs:  Clear throughout to auscultation.    Heart:  Regular rate and rhythm. Abdomen:  Soft, nontender and nondistended. Normal bowel sounds, without guarding, and without rebound.   Neurologic:  Alert and  oriented x4;  grossly normal neurologically.  Impression/Plan: Jessica Clayton is here for an colonoscopy to be performed for family hisoty of colon cancer  Risks, benefits, limitations, and alternatives regarding  colonoscopy have been reviewed with the patient.  Questions have been answered.  All parties agreeable.   Lucilla Lame, MD  05/23/2022, 9:08 AM

## 2022-05-23 NOTE — Op Note (Signed)
[facility] Gastroenterology Patient Name: Jessica Clayton Procedure Date: 05/23/2022 9:42 AM MRN: [id number] Account #: [id number] Date of Birth: 1961/04/22 Admit Type: Outpatient Age: 61 Room: [facility] OR ROOM 01 Gender: Female Note Status: Finalized Instrument Name: [id number] Procedure:             Colonoscopy Indications:           Screening for colorectal malignant neoplasm, Family                         history of colon cancer in a first-degree relative Providers:             Lucilla Lame MD, MD Referring MD:          Deborra Medina, MD (Referring MD) Medicines:             Propofol per Anesthesia Complications:         No immediate complications. Procedure:             Pre-Anesthesia Assessment:                        - Prior to the procedure, a History and Physical was                         performed, and patient medications and allergies were                         reviewed. The patient's tolerance of previous                         anesthesia was also reviewed. The risks and benefits                         of the procedure and the sedation options and risks                         were discussed with the patient. All questions were                         answered, and informed consent was obtained. Prior                         Anticoagulants: The patient has taken no previous                         anticoagulant or antiplatelet agents. ASA Grade                         Assessment: II - A patient with mild systemic disease.                         After reviewing the risks and benefits, the patient                         was deemed in satisfactory condition to undergo the                         procedure.  After obtaining informed consent, the colonoscope was                         passed under direct vision. Throughout the procedure,                         the patient's blood pressure, pulse, and oxygen                          saturations were monitored continuously. The                         Colonoscope was introduced through the anus and                         advanced to the the cecum, identified by appendiceal                         orifice and ileocecal valve. The colonoscopy was                         performed without difficulty. The patient tolerated                         the procedure well. The quality of the bowel                         preparation was excellent. Findings:      The perianal and digital rectal examinations were normal.      A 4 mm polyp was found in the transverse colon. The polyp was sessile.       The polyp was removed with a cold snare. Resection and retrieval were       complete.      Multiple small-mouthed diverticula were found in the sigmoid colon and       descending colon.      Non-bleeding internal hemorrhoids were found during retroflexion. The       hemorrhoids were Grade I (internal hemorrhoids that do not prolapse). Impression:            - One 4 mm polyp in the transverse colon, removed with                         a cold snare. Resected and retrieved.                        - Diverticulosis in the sigmoid colon and in the                         descending colon.                        - Non-bleeding internal hemorrhoids. Recommendation:        - Discharge patient to home.                        - Resume previous diet.                        - Continue present medications.                        -  Await pathology results.                        - Repeat colonoscopy in 5 years for surveillance. Procedure Code(s):     --- Professional ---                        [id number], Colonoscopy, flexible; with removal of                         tumor(s), polyp(s), or other lesion(s) by snare                         technique Diagnosis Code(s):     --- Professional ---                        Z12.11, Encounter for screening for malignant neoplasm                         of  colon                        K63.5, Polyp of colon CPT copyright 2019 American Medical Association. All rights reserved. The codes documented in this report are preliminary and upon coder review may  be revised to meet current compliance requirements. Lucilla Lame MD, MD 05/23/2022 10:15:13 AM This report has been signed electronically. Number of Addenda: 0 Note Initiated On: 05/23/2022 9:42 AM Scope Withdrawal Time: 0 hours 7 minutes 1 second  Total Procedure Duration: 0 hours 15 minutes 26 seconds  Estimated Blood Loss:  Estimated blood loss: none.      [facility]

## 2022-05-23 NOTE — Anesthesia Postprocedure Evaluation (Signed)
Anesthesia Post Note  Patient: Jessica Clayton  Procedure(s) Performed: COLONOSCOPY WITH PROPOFOL (Rectum)  Patient location during evaluation: PACU Anesthesia Type: General Level of consciousness: awake and alert Pain management: pain level controlled Vital Signs Assessment: post-procedure vital signs reviewed and stable Respiratory status: spontaneous breathing, nonlabored ventilation, respiratory function stable and patient connected to nasal cannula oxygen Cardiovascular status: blood pressure returned to baseline and stable Postop Assessment: no apparent nausea or vomiting Anesthetic complications: no   No notable events documented.   Last Vitals:  Vitals:   05/23/22 1017 05/23/22 1027  BP: (!) 111/57 131/61  Pulse: (!) 47 (!) 50  Resp: 16 20  Temp: 36.6 C   SpO2: 100% 99%    Last Pain:  Vitals:   05/23/22 1027  TempSrc:   PainSc: 0-No pain                 Precious Haws Zaheer Wageman

## 2022-05-23 NOTE — Transfer of Care (Signed)
Immediate Anesthesia Transfer of Care Note  Patient: Jessica Clayton  Procedure(s) Performed: COLONOSCOPY WITH PROPOFOL (Rectum)  Patient Location: PACU  Anesthesia Type: General  Level of Consciousness: awake, alert  and patient cooperative  Airway and Oxygen Therapy: Patient Spontanous Breathing and Patient connected to supplemental oxygen  Post-op Assessment: Post-op Vital signs reviewed, Patient's Cardiovascular Status Stable, Respiratory Function Stable, Patent Airway and No signs of Nausea or vomiting  Post-op Vital Signs: Reviewed and stable  Complications: No notable events documented.

## 2022-05-26 ENCOUNTER — Encounter: Payer: Self-pay | Admitting: Gastroenterology

## 2022-05-27 ENCOUNTER — Other Ambulatory Visit: Payer: Self-pay

## 2022-05-27 ENCOUNTER — Encounter: Payer: Self-pay | Admitting: Internal Medicine

## 2022-05-27 ENCOUNTER — Encounter: Payer: Self-pay | Admitting: Gastroenterology

## 2022-05-27 LAB — SURGICAL PATHOLOGY

## 2022-05-27 MED ORDER — SEMAGLUTIDE-WEIGHT MANAGEMENT 1 MG/0.5ML ~~LOC~~ SOAJ
1.0000 mg | SUBCUTANEOUS | 2 refills | Status: DC
  Filled 2022-05-27: qty 2, 28d supply, fill #0
  Filled 2022-07-03: qty 2, 28d supply, fill #1
  Filled 2022-08-05 – 2022-08-08 (×2): qty 2, 28d supply, fill #2

## 2022-05-29 ENCOUNTER — Other Ambulatory Visit: Payer: Self-pay

## 2022-05-29 ENCOUNTER — Encounter: Payer: Self-pay | Admitting: Internal Medicine

## 2022-05-29 NOTE — Telephone Encounter (Signed)
Refilled: 12/13/2020 Last OV: 05/21/2022 Next OV: 06/11/2022

## 2022-05-30 ENCOUNTER — Other Ambulatory Visit: Payer: Self-pay

## 2022-05-30 MED ORDER — CYCLOBENZAPRINE HCL 5 MG PO TABS
ORAL_TABLET | ORAL | 0 refills | Status: DC
  Filled 2022-05-30: qty 30, 30d supply, fill #0

## 2022-06-11 ENCOUNTER — Ambulatory Visit: Payer: 59 | Admitting: Internal Medicine

## 2022-07-03 ENCOUNTER — Other Ambulatory Visit: Payer: Self-pay

## 2022-07-17 DIAGNOSIS — M1711 Unilateral primary osteoarthritis, right knee: Secondary | ICD-10-CM | POA: Diagnosis not present

## 2022-07-17 DIAGNOSIS — M170 Bilateral primary osteoarthritis of knee: Secondary | ICD-10-CM | POA: Diagnosis not present

## 2022-07-29 DIAGNOSIS — G4733 Obstructive sleep apnea (adult) (pediatric): Secondary | ICD-10-CM | POA: Diagnosis not present

## 2022-08-05 ENCOUNTER — Other Ambulatory Visit: Payer: Self-pay

## 2022-08-05 ENCOUNTER — Telehealth: Payer: Self-pay | Admitting: Internal Medicine

## 2022-08-05 NOTE — Telephone Encounter (Signed)
Pt need PA for wegovy

## 2022-08-08 ENCOUNTER — Other Ambulatory Visit: Payer: Self-pay

## 2022-08-08 ENCOUNTER — Telehealth: Payer: Self-pay

## 2022-08-08 ENCOUNTER — Other Ambulatory Visit (HOSPITAL_COMMUNITY): Payer: Self-pay

## 2022-08-08 NOTE — Telephone Encounter (Signed)
ERROR

## 2022-08-08 NOTE — Telephone Encounter (Signed)
Patient Advocate Encounter   Received notification from [employer] that prior authorization for [facility] '1MG'$ /0.5ML is required.   PA submitted on 08/08/2022 Key [id number] Status is pending       Joneen Boers, [place] Patient Advocate Specialist [place] Patient Advocate Team Direct Number: [phone] Fax: [phone]

## 2022-08-08 NOTE — Telephone Encounter (Signed)
Patient Advocate Encounter  Prior Authorization for Wegovy '1mg'$ /0.24m has been approved.    PA# Prior Authorization Reference Number: [id number] Effective dates: 08/08/22 through 08/08/23  Additional prior authorizations (PA) have been entered for: Wegovy 0.'25mg'$ /0.581mwith a quantity limit of 4 pens (28m76mper 28 days (PA [id number]'5mg'$ /0.5mL13mth a quantity limit of 4 pens (28mL)65mr 28 days (PA [id number] 1.'7mg'$ /0.75mL 64m a quantity limit of 4 pens (3mL) p16m28 days ([place] 21287) 23361y 2.'4mg'$ /0.75mL wi40m quantity limit of 4 pens (3mL) per48m days ([place] 21288)  [id number] pharmacy of the approval.

## 2022-08-12 NOTE — Telephone Encounter (Signed)
noted 

## 2022-08-14 ENCOUNTER — Telehealth: Payer: Self-pay | Admitting: Internal Medicine

## 2022-08-14 NOTE — Telephone Encounter (Signed)
LMTCB

## 2022-08-14 NOTE — Telephone Encounter (Signed)
Pt called in asking to send a message to Dr Derrel Nip assistant because she's having a really bad sinuses, running nose and headaches. She's available at [phone].

## 2022-08-15 NOTE — Telephone Encounter (Signed)
Pt returned Jessica's call. Advised due to sx - needs eval at [facility]

## 2022-09-18 ENCOUNTER — Other Ambulatory Visit: Payer: Self-pay | Admitting: Internal Medicine

## 2022-09-18 ENCOUNTER — Other Ambulatory Visit: Payer: Self-pay

## 2022-09-18 DIAGNOSIS — M25561 Pain in right knee: Secondary | ICD-10-CM | POA: Diagnosis not present

## 2022-09-18 DIAGNOSIS — M7542 Impingement syndrome of left shoulder: Secondary | ICD-10-CM | POA: Diagnosis not present

## 2022-09-18 MED ORDER — CELECOXIB 200 MG PO CAPS
200.0000 mg | ORAL_CAPSULE | Freq: Every day | ORAL | 0 refills | Status: DC
  Filled 2022-09-18: qty 30, 30d supply, fill #0

## 2022-09-18 MED ORDER — METHYLPREDNISOLONE 4 MG PO TBPK
ORAL_TABLET | ORAL | 0 refills | Status: DC
  Filled 2022-09-18: qty 21, 6d supply, fill #0

## 2022-09-19 ENCOUNTER — Other Ambulatory Visit: Payer: Self-pay

## 2022-09-19 MED FILL — Semaglutide (Weight Mngmt) Soln Auto-Injector 1 MG/0.5ML: SUBCUTANEOUS | 28 days supply | Qty: 2 | Fill #0 | Status: AC

## 2022-10-30 DIAGNOSIS — G4733 Obstructive sleep apnea (adult) (pediatric): Secondary | ICD-10-CM | POA: Diagnosis not present

## 2022-11-04 ENCOUNTER — Other Ambulatory Visit: Payer: Self-pay

## 2022-11-04 MED FILL — Semaglutide (Weight Mngmt) Soln Auto-Injector 1 MG/0.5ML: SUBCUTANEOUS | 28 days supply | Qty: 2 | Fill #1 | Status: AC

## 2022-11-04 MED FILL — Semaglutide (Weight Mngmt) Soln Auto-Injector 1 MG/0.5ML: SUBCUTANEOUS | 28 days supply | Qty: 2 | Fill #1 | Status: CN

## 2022-11-29 ENCOUNTER — Other Ambulatory Visit: Payer: Self-pay

## 2022-11-29 MED FILL — Semaglutide (Weight Mngmt) Soln Auto-Injector 1 MG/0.5ML: SUBCUTANEOUS | 28 days supply | Qty: 2 | Fill #2 | Status: AC

## 2022-11-30 DIAGNOSIS — G4733 Obstructive sleep apnea (adult) (pediatric): Secondary | ICD-10-CM | POA: Diagnosis not present

## 2022-12-01 ENCOUNTER — Other Ambulatory Visit: Payer: Self-pay

## 2022-12-08 ENCOUNTER — Encounter: Payer: Self-pay | Admitting: Internal Medicine

## 2022-12-08 ENCOUNTER — Other Ambulatory Visit: Payer: Self-pay

## 2022-12-08 ENCOUNTER — Ambulatory Visit: Payer: Commercial Managed Care - PPO | Admitting: Internal Medicine

## 2022-12-08 VITALS — BP 124/66 | HR 59 | Temp 97.9°F | Ht 64.0 in | Wt 193.6 lb

## 2022-12-08 DIAGNOSIS — E785 Hyperlipidemia, unspecified: Secondary | ICD-10-CM

## 2022-12-08 DIAGNOSIS — I10 Essential (primary) hypertension: Secondary | ICD-10-CM | POA: Diagnosis not present

## 2022-12-08 DIAGNOSIS — M79604 Pain in right leg: Secondary | ICD-10-CM

## 2022-12-08 DIAGNOSIS — E538 Deficiency of other specified B group vitamins: Secondary | ICD-10-CM

## 2022-12-08 DIAGNOSIS — K760 Fatty (change of) liver, not elsewhere classified: Secondary | ICD-10-CM | POA: Diagnosis not present

## 2022-12-08 DIAGNOSIS — Z1231 Encounter for screening mammogram for malignant neoplasm of breast: Secondary | ICD-10-CM

## 2022-12-08 LAB — COMPREHENSIVE METABOLIC PANEL
ALT: 16 U/L (ref 0–35)
AST: 15 U/L (ref 0–37)
Albumin: 4.3 g/dL (ref 3.5–5.2)
Alkaline Phosphatase: 63 U/L (ref 39–117)
BUN: 12 mg/dL (ref 6–23)
CO2: 29 mEq/L (ref 19–32)
Calcium: 9.1 mg/dL (ref 8.4–10.5)
Chloride: 100 mEq/L (ref 96–112)
Creatinine, Ser: 0.76 mg/dL (ref 0.40–1.20)
GFR: 83.99 mL/min (ref >60.00)
Glucose, Bld: 87 mg/dL (ref 70–99)
Potassium: 4 mEq/L (ref 3.5–5.1)
Sodium: 137 mEq/L (ref 135–145)
Total Bilirubin: 0.7 mg/dL (ref 0.2–1.2)
Total Protein: 6.5 g/dL (ref 6.0–8.3)

## 2022-12-08 LAB — LIPID PANEL
Cholesterol: 150 mg/dL (ref 0–200)
HDL: 49.8 mg/dL (ref >39.00)
LDL Cholesterol: 88 mg/dL (ref 0–99)
NonHDL: 99.7
Total CHOL/HDL Ratio: 3
Triglycerides: 58 mg/dL (ref 0.0–149.0)
VLDL: 11.6 mg/dL (ref 0.0–40.0)

## 2022-12-08 LAB — LDL CHOLESTEROL, DIRECT: Direct LDL: 92 mg/dL

## 2022-12-08 LAB — VITAMIN B12: Vitamin B-12: 79 pg/mL — ABNORMAL LOW (ref 211–911)

## 2022-12-08 MED ORDER — SEMAGLUTIDE (1 MG/DOSE) 4 MG/3ML ~~LOC~~ SOPN: 1.0000 mg | PEN_INJECTOR | SUBCUTANEOUS | 2 refills | Status: DC

## 2022-12-08 MED ORDER — CYCLOBENZAPRINE HCL 5 MG PO TABS
5.0000 mg | ORAL_TABLET | Freq: Every evening | ORAL | 0 refills | Status: DC | PRN
  Filled 2022-12-08: qty 30, 30d supply, fill #0

## 2022-12-08 NOTE — Assessment & Plan Note (Signed)
She has lost 50 lbs and would like to continue wegovy but has not taken the 1 mg dose in several weeks and can no longer get it through [facility].  Website info for discounted prices on [employer] given :  http://lester.info/   This website  offers discounts for ozempic.

## 2022-12-08 NOTE — Assessment & Plan Note (Addendum)
Secondary to DJD right knee.  She has postponed TKR to accumulate more PAL.  Has been using celebrex 200 mg daily.  Lfts due. Adding tylenol for pain management

## 2022-12-08 NOTE — Assessment & Plan Note (Addendum)
Recheck needed ; if normal will continueonce daily use of celebrex    Lab Results  Component Value Date   ALT 16 05/21/2022   AST 16 05/21/2022   ALKPHOS 66 05/21/2022   BILITOT 0.7 05/21/2022

## 2022-12-08 NOTE — Progress Notes (Unsigned)
Subjective:  Patient ID: Jessica Clayton, female    DOB: 1960-11-26  Age: 62 y.o. MRN: [id number]  CC: The primary encounter diagnosis was Encounter for screening mammogram for malignant neoplasm of breast. Diagnoses of Primary hypertension, Hyperlipidemia with target LDL less than 100, Hepatic steatosis, B12 deficiency, Morbid obesity, and Right leg pain were also pertinent to this visit.   HPI Jessica Clayton presents for follow up on weight management and hypertension  Chief Complaint  Patient presents with   Medical Management of Chronic Issues   1) HTN: taking telmisartan 20 mg daily Patient is taking her medications as prescribed and notes no adverse effects.  Home BP readings have been done about once per week and are  generally < 130/80 .  She is avoiding added salt in her diet and walking regularly about 3 times per week for exercise  .   2) obesity:  taking wegovy 1 mg for the past several months  has not had any weight loss in the past month .  Last dose was several weeks ago.  Access to medication now an issue due to [facility] no longer covering GLP 1 agonists for weight loss.    3) Right knee pain:  She is hindered from exercising due to chronic  ight  leg pain due to knee DJD .  Seeing Martha Clan,  who prescribed celebrex 200 mg ,  which she has taken,  but has not combined with tylenol  4) H/o COVID infection in January.  Treated at Urgent Care ,  followed by flu like symptoms. Felt that the entire month of January was lost to fatigue and illness   http://lester.info/   This website  offers discounts for ozempic.    Outpatient Medications Prior to Visit  Medication Sig Dispense Refill   aspirin 81 MG tablet Take 81 mg by mouth daily.     celecoxib (CELEBREX) 200 MG capsule Take 1 capsule (200 mg total) by mouth daily. 30 capsule 0   DANDELION PO Take by mouth daily.     Iron, Ferrous Sulfate, 325 (65 Fe) MG TABS Take 325 mg by mouth daily. 90  tablet 1   MILK THISTLE PO Take by mouth daily.     telmisartan (MICARDIS) 20 MG tablet TAKE 1 TABLET (20 MG TOTAL) BY MOUTH DAILY. 90 tablet 1   TURMERIC PO Take by mouth daily.     VITAMIN D PO Take by mouth.     cyclobenzaprine (FLEXERIL) 5 MG tablet take 1 tablet by mouth at bedtime as needed 30 tablet 0   Semaglutide-Weight Management (WEGOVY) 1 MG/0.5ML SOAJ Inject 1 mg into the skin once a week. 2 mL 2   meloxicam (MOBIC) 15 MG tablet Take 1 tablet every day by oral route with meals. (Patient not taking: Reported on 12/08/2022) 30 tablet 0   methylPREDNISolone (MEDROL) 4 MG TBPK tablet Take as directed (Patient not taking: Reported on 12/08/2022) 21 tablet 0   No facility-administered medications prior to visit.    Review of Systems;  Patient denies headache, fevers, malaise, unintentional weight loss, skin rash, eye pain, sinus congestion and sinus pain, sore throat, dysphagia,  hemoptysis , cough, dyspnea, wheezing, chest pain, palpitations, orthopnea, edema, abdominal pain, nausea, melena, diarrhea, constipation, flank pain, dysuria, hematuria, urinary  Frequency, nocturia, numbness, tingling, seizures,  Focal weakness, Loss of consciousness,  Tremor, insomnia, depression, anxiety, and suicidal ideation.      Objective:  BP 124/66   Pulse (!) 59  Temp 97.9 F (36.6 C) (Oral)   Ht 5\' 4"  (1.626 m)   Wt 193 lb 9.6 oz (87.8 kg)   SpO2 99%   BMI 33.23 kg/m   BP Readings from Last 3 Encounters:  12/08/22 124/66  05/23/22 131/61  05/21/22 126/70    Wt Readings from Last 3 Encounters:  12/08/22 193 lb 9.6 oz (87.8 kg)  05/23/22 203 lb (92.1 kg)  05/21/22 206 lb 9.6 oz (93.7 kg)    Physical Exam Vitals reviewed.  Constitutional:      General: She is not in acute distress.    Appearance: Normal appearance. She is normal weight. She is not ill-appearing, toxic-appearing or diaphoretic.  HENT:     Head: Normocephalic.  Eyes:     General: No scleral icterus.       Right  eye: No discharge.        Left eye: No discharge.     Conjunctiva/sclera: Conjunctivae normal.  Cardiovascular:     Rate and Rhythm: Normal rate and regular rhythm.     Heart sounds: Normal heart sounds.  Pulmonary:     Effort: Pulmonary effort is normal. No respiratory distress.     Breath sounds: Normal breath sounds.  Musculoskeletal:        General: Swelling present.     Right knee: Swelling, deformity and bony tenderness present. Decreased range of motion.       Legs:  Skin:    General: Skin is warm and dry.  Neurological:     General: No focal deficit present.     Mental Status: She is alert and oriented to person, place, and time. Mental status is at baseline.  Psychiatric:        Mood and Affect: Mood normal.        Behavior: Behavior normal.        Thought Content: Thought content normal.        Judgment: Judgment normal.    Lab Results  Component Value Date   HGBA1C 5.1 05/21/2022   HGBA1C 5.1 12/06/2021   HGBA1C 5.7 04/11/2021    Lab Results  Component Value Date   CREATININE 0.76 12/08/2022   CREATININE 0.73 05/21/2022   CREATININE 0.79 12/06/2021    Lab Results  Component Value Date   WBC 5.5 12/06/2021   HGB 12.7 12/06/2021   HCT 37.4 12/06/2021   PLT 311 12/06/2021   GLUCOSE 87 12/08/2022   CHOL 150 12/08/2022   TRIG 58.0 12/08/2022   HDL 49.80 12/08/2022   LDLDIRECT 92.0 12/08/2022   LDLCALC 88 12/08/2022   ALT 16 12/08/2022   AST 15 12/08/2022   NA 137 12/08/2022   K 4.0 12/08/2022   CL 100 12/08/2022   CREATININE 0.76 12/08/2022   BUN 12 12/08/2022   CO2 29 12/08/2022   TSH 1.82 05/21/2022   HGBA1C 5.1 05/21/2022   MICROALBUR 0.2 12/06/2021    No results found.  Assessment & Plan:  .Encounter for screening mammogram for malignant neoplasm of breast -     3D Screening Mammogram, Left and Right; Future  Primary hypertension Assessment & Plan: Well controlled on current regimen of low dose telmisartan. . Renal function is due, no  changes today.   Orders: -     Comprehensive metabolic panel -     Microalbumin / creatinine urine ratio  Hyperlipidemia with target LDL less than 100 -     Lipid panel -     LDL cholesterol, direct  Hepatic steatosis Assessment &  Plan: Recheck  LFT s and normal; will continue  once daily use of celebrex  and add tylenol for pain management   Lab Results  Component Value Date   ALT 16 12/08/2022   AST 15 12/08/2022   ALKPHOS 63 12/08/2022   BILITOT 0.7 12/08/2022      B12 deficiency Assessment & Plan: Recurrent and very low,  may have been aggravated y use of GLP 1 agonist.  Parenteral injections needed  Lab Results  Component Value Date   VITAMINB12 79 (L) 12/08/2022     Orders: -     Vitamin B12  Morbid obesity Assessment & Plan: She has lost 50 lbs and would like to continue wegovy but has not taken the 1 mg dose in several weeks and can no longer  afford the medication due to [facility] decision to not cover it. .  Her ability to exercise vigorously is hindered by her right knee DJD/  Website info for discounted prices on [place] given  with advise to consider purchasing OOP at a higher dose and using the conversion table to manipulate dose.   http://lester.info/   This website  offers discounts for ozempic.     Right leg pain Assessment & Plan: Secondary to DJD right knee.  She has postponed TKR to accumulate more PAL.  Has been using celebrex 200 mg daily.  Lfts due. Adding tylenol for pain management    Other orders -     Cyclobenzaprine HCl; Take 1 tablet (5 mg total) by mouth at bedtime as needed.  Dispense: 30 tablet; Refill: 0 -     Semaglutide (1 MG/DOSE); Inject 1 mg as directed once a week.  Dispense: 3 mL; Refill: 2     I provided  29 minutes  on the day of this encounter reviewing patient's last visit with me, patient's  most recent visit with cardiology,  Orthopedics and GI, ,  recent surgical and non surgical procedures,  previous  labs and imaging studies, counseling on currently addressed issues,  and post visit ordering to diagnostics and therapeutics .   Follow-up: Return in about 3 months (around 03/09/2023) for hypertension, physical.   Sherlene Shams, MD

## 2022-12-08 NOTE — Patient Instructions (Addendum)
I'm sorry that [facility] is no longer approving Wegovy or Zepbound for weight loss   http://lester.info/   is a  website  that supposedly  offers discounts for ozempic, which is the same  drug as wegovy   I have printed a prescription for ozempic at the same dose as you were taking of Wegovy  If it is not affordable, the other option is to ask  for a higher dose to send in , and we can manipulate the syringe to deliver a lower dose which will last you 2-3 months

## 2022-12-09 ENCOUNTER — Other Ambulatory Visit: Payer: Self-pay | Admitting: Internal Medicine

## 2022-12-09 LAB — MICROALBUMIN / CREATININE URINE RATIO
Creatinine,U: 242.6 mg/dL
Microalb Creat Ratio: 1.2 mg/g (ref 0.0–30.0)
Microalb, Ur: 2.9 mg/dL — ABNORMAL HIGH (ref 0.0–1.9)

## 2022-12-09 MED ORDER — CELECOXIB 200 MG PO CAPS
200.0000 mg | ORAL_CAPSULE | Freq: Every day | ORAL | 2 refills | Status: DC
  Filled 2022-12-09: qty 30, 30d supply, fill #0
  Filled 2023-01-13: qty 30, 30d supply, fill #1
  Filled 2023-04-06: qty 30, 30d supply, fill #2

## 2022-12-09 NOTE — Assessment & Plan Note (Signed)
Recurrent and very low,  may have been aggravated y use of GLP 1 agonist.  Parenteral injections needed  Lab Results  Component Value Date   VITAMINB12 79 (L) 12/08/2022

## 2022-12-09 NOTE — Assessment & Plan Note (Signed)
Well controlled on current regimen of low dose telmisartan. . Renal function is due, no changes today.

## 2022-12-10 ENCOUNTER — Other Ambulatory Visit: Payer: Self-pay

## 2022-12-11 ENCOUNTER — Ambulatory Visit (INDEPENDENT_AMBULATORY_CARE_PROVIDER_SITE_OTHER): Payer: Commercial Managed Care - PPO

## 2022-12-11 DIAGNOSIS — E538 Deficiency of other specified B group vitamins: Secondary | ICD-10-CM

## 2022-12-11 MED ORDER — CYANOCOBALAMIN 1000 MCG/ML IJ SOLN
1000.0000 ug | Freq: Once | INTRAMUSCULAR | Status: AC
Start: 2022-12-11 — End: 2022-12-11
  Administered 2022-12-11: 1000 ug via INTRAMUSCULAR

## 2022-12-11 NOTE — Progress Notes (Signed)
After obtaining consent, and per orders of Dr. Clent Ridges, injection of b-12 given IM in left deltoid by Valentino Nose. Patient tolerated injection well.

## 2022-12-12 ENCOUNTER — Encounter: Payer: Self-pay | Admitting: Internal Medicine

## 2022-12-12 ENCOUNTER — Other Ambulatory Visit: Payer: Self-pay | Admitting: Internal Medicine

## 2022-12-12 MED ORDER — TELMISARTAN 20 MG PO TABS: 40.0000 mg | ORAL_TABLET | Freq: Every day | ORAL | 1 refills | Status: DC

## 2022-12-14 LAB — METHYLMALONIC ACID, SERUM: Methylmalonic Acid, Quant: 690 nmol/L — ABNORMAL HIGH (ref 87–318)

## 2022-12-15 ENCOUNTER — Encounter: Payer: Self-pay | Admitting: Internal Medicine

## 2022-12-15 DIAGNOSIS — G4733 Obstructive sleep apnea (adult) (pediatric): Secondary | ICD-10-CM

## 2022-12-16 LAB — INTRINSIC FACTOR ANTIBODIES: Intrinsic Factor: POSITIVE — AB

## 2022-12-16 NOTE — Telephone Encounter (Signed)
Does pt need a new sleep study since it has been 10 years since last one?

## 2022-12-17 NOTE — Assessment & Plan Note (Signed)
Repeat sleep study needed (last one 2015) and new CPAP machine needed.

## 2022-12-18 ENCOUNTER — Ambulatory Visit (INDEPENDENT_AMBULATORY_CARE_PROVIDER_SITE_OTHER): Payer: Commercial Managed Care - PPO

## 2022-12-18 DIAGNOSIS — E538 Deficiency of other specified B group vitamins: Secondary | ICD-10-CM | POA: Diagnosis not present

## 2022-12-18 MED ORDER — CYANOCOBALAMIN 1000 MCG/ML IJ SOLN
1000.0000 ug | Freq: Once | INTRAMUSCULAR | Status: AC
Start: 2022-12-18 — End: 2022-12-18
  Administered 2022-12-18: 1000 ug via INTRAMUSCULAR

## 2022-12-18 NOTE — Progress Notes (Signed)
After obtaining consent, and per orders of Dr. Walsh, injection of B-12 given IM in right deltoid by Daniele Yankowski Lynn. Patient tolerated injection well.  

## 2022-12-25 ENCOUNTER — Ambulatory Visit (INDEPENDENT_AMBULATORY_CARE_PROVIDER_SITE_OTHER): Payer: Commercial Managed Care - PPO

## 2022-12-25 DIAGNOSIS — E538 Deficiency of other specified B group vitamins: Secondary | ICD-10-CM | POA: Diagnosis not present

## 2022-12-25 MED ORDER — CYANOCOBALAMIN 1000 MCG/ML IJ SOLN
1000.0000 ug | Freq: Once | INTRAMUSCULAR | Status: AC
Start: 2022-12-25 — End: 2022-12-25
  Administered 2022-12-25: 1000 ug via INTRAMUSCULAR

## 2022-12-25 NOTE — Progress Notes (Signed)
Patient presented for B 12 injection to left deltoid, patient voiced no concerns nor showed any signs of distress during injection. 

## 2022-12-30 DIAGNOSIS — G4733 Obstructive sleep apnea (adult) (pediatric): Secondary | ICD-10-CM | POA: Diagnosis not present

## 2023-01-01 ENCOUNTER — Ambulatory Visit (INDEPENDENT_AMBULATORY_CARE_PROVIDER_SITE_OTHER): Payer: Commercial Managed Care - PPO

## 2023-01-01 DIAGNOSIS — E538 Deficiency of other specified B group vitamins: Secondary | ICD-10-CM

## 2023-01-01 MED ORDER — CYANOCOBALAMIN 1000 MCG/ML IJ SOLN
1000.0000 ug | Freq: Once | INTRAMUSCULAR | Status: AC
Start: 2023-01-01 — End: 2023-01-01
  Administered 2023-01-01: 1000 ug via INTRAMUSCULAR

## 2023-01-01 NOTE — Progress Notes (Signed)
Patient presented for B 12 injection to left deltoid, patient voiced no concerns nor showed any signs of distress during injection. 

## 2023-01-05 ENCOUNTER — Ambulatory Visit
Admission: RE | Admit: 2023-01-05 | Discharge: 2023-01-05 | Disposition: A | Discharge status: Home / Self Care | Payer: Commercial Managed Care - PPO | Source: Ambulatory Visit | Attending: Internal Medicine | Admitting: Internal Medicine

## 2023-01-05 DIAGNOSIS — Z1231 Encounter for screening mammogram for malignant neoplasm of breast: Secondary | ICD-10-CM | POA: Insufficient documentation

## 2023-01-08 ENCOUNTER — Ambulatory Visit (INDEPENDENT_AMBULATORY_CARE_PROVIDER_SITE_OTHER): Payer: Commercial Managed Care - PPO

## 2023-01-08 DIAGNOSIS — E538 Deficiency of other specified B group vitamins: Secondary | ICD-10-CM

## 2023-01-08 MED ORDER — CYANOCOBALAMIN 1000 MCG/ML IJ SOLN
1000.0000 ug | Freq: Once | INTRAMUSCULAR | Status: AC
Start: 2023-01-08 — End: 2023-01-08
  Administered 2023-01-08: 1000 ug via INTRAMUSCULAR

## 2023-01-08 NOTE — Progress Notes (Signed)
Patient arrived for a B12 injection and it was administered into her right deltoid. Patient tolerated the injection well and did not show any signs of distress or voice any concerns.   Patient would like to know if the plan is to keep taking the B12 injections weekly or monthly. This was 4 of 4 weekly B12's.

## 2023-01-13 ENCOUNTER — Other Ambulatory Visit: Payer: Self-pay

## 2023-01-13 ENCOUNTER — Other Ambulatory Visit: Payer: Self-pay | Admitting: Internal Medicine

## 2023-01-13 MED FILL — Cyclobenzaprine HCl Tab 5 MG: ORAL | 30 days supply | Qty: 30 | Fill #0 | Status: AC

## 2023-01-14 ENCOUNTER — Other Ambulatory Visit: Payer: Self-pay

## 2023-01-15 ENCOUNTER — Telehealth: Payer: Self-pay

## 2023-01-15 NOTE — Telephone Encounter (Signed)
noted 

## 2023-01-15 NOTE — Telephone Encounter (Signed)
Pt is scheduled for June 19 at 430

## 2023-01-15 NOTE — Telephone Encounter (Signed)
LMTCB. Need to schedule pt for an appointment with Dr. Darrick Huntsman to discuss her sleep study results.

## 2023-01-26 DIAGNOSIS — M1712 Unilateral primary osteoarthritis, left knee: Secondary | ICD-10-CM | POA: Diagnosis not present

## 2023-01-30 ENCOUNTER — Encounter: Payer: Self-pay | Admitting: Internal Medicine

## 2023-02-04 ENCOUNTER — Telehealth: Payer: Commercial Managed Care - PPO | Admitting: Internal Medicine

## 2023-02-09 ENCOUNTER — Ambulatory Visit (INDEPENDENT_AMBULATORY_CARE_PROVIDER_SITE_OTHER): Payer: Commercial Managed Care - PPO

## 2023-02-09 DIAGNOSIS — E538 Deficiency of other specified B group vitamins: Secondary | ICD-10-CM

## 2023-02-09 MED ORDER — CYANOCOBALAMIN 1000 MCG/ML IJ SOLN
1000.0000 ug | Freq: Once | INTRAMUSCULAR | Status: AC
Start: 2023-02-09 — End: 2023-02-09
  Administered 2023-02-09: 1000 ug via INTRAMUSCULAR

## 2023-02-09 NOTE — Progress Notes (Signed)
After obtaining consent, and per orders of Dr. Duncan Dull, injection of B-12 given IM in left deltoid by Valentino Nose. Patient tolerated injection well.

## 2023-02-23 ENCOUNTER — Other Ambulatory Visit: Payer: Self-pay

## 2023-02-23 ENCOUNTER — Encounter: Payer: Self-pay | Admitting: Internal Medicine

## 2023-02-23 ENCOUNTER — Telehealth: Payer: Commercial Managed Care - PPO | Admitting: Internal Medicine

## 2023-02-23 VITALS — Ht 64.0 in | Wt 193.0 lb

## 2023-02-23 DIAGNOSIS — I10 Essential (primary) hypertension: Secondary | ICD-10-CM

## 2023-02-23 DIAGNOSIS — E785 Hyperlipidemia, unspecified: Secondary | ICD-10-CM

## 2023-02-23 DIAGNOSIS — G4733 Obstructive sleep apnea (adult) (pediatric): Secondary | ICD-10-CM

## 2023-02-23 DIAGNOSIS — R0602 Shortness of breath: Secondary | ICD-10-CM | POA: Diagnosis not present

## 2023-02-23 DIAGNOSIS — E538 Deficiency of other specified B group vitamins: Secondary | ICD-10-CM

## 2023-02-23 DIAGNOSIS — D126 Benign neoplasm of colon, unspecified: Secondary | ICD-10-CM

## 2023-02-23 MED ORDER — TELMISARTAN 20 MG PO TABS
40.0000 mg | ORAL_TABLET | Freq: Every day | ORAL | 1 refills | Status: DC
  Filled 2023-02-23 – 2023-03-12 (×2): qty 90, 45d supply, fill #0
  Filled 2023-05-17: qty 90, 45d supply, fill #1

## 2023-02-23 NOTE — Assessment & Plan Note (Addendum)
Repeat sleep study done at home by [place] reviewed with patient  confirms persistent OSA despite 50 lb weight loss.   CPAP ordered

## 2023-02-23 NOTE — Progress Notes (Unsigned)
Virtual Visit via Caregility   Note   This format is felt to be most appropriate for this patient at this time.  All issues noted in this document were discussed and addressed.  No physical exam was performed (except for noted visual exam findings with Video Visits).   I connected with Jessica Clayton  on 02/23/23 at  4:30 PM EDT by a video enabled telemedicine application and verified that I am speaking with the correct person using two identifiers. Location patient: home Location provider: work or home office Persons participating in the virtual visit: patient, provider  I discussed the limitations, risks, security and privacy concerns of performing an evaluation and management service by telephone and the availability of in person appointments. I also discussed with the patient that there may be a patient responsible charge related to this service. The patient expressed understanding and agreed to proceed.  Reason for visit: follow up on recent sleep study  HPI:  62 yr old female with treated OSA presents for follow up on recent retest (last test  was over 10 yrs ago, and she needs a  new  machine.  )  A Home sleep study was done on May 9 and 10  and reviewed with patient today:  on Night #1  AHI 29.2 , no desats.  AHI was  18.7 on night 2  with desats to < 89% noted 65% of the time .  PFTs noted mild restrictive volumes (FVC 72% predicted, FEV1/FVC  104% of predicted) .  6 minute walk was normal (no desats)   B12 deficiency noted in April .  Has been taking IM supplementation. Now monthly.      ROS: See pertinent positives and negatives per HPI.  Past Medical History:  Diagnosis Date   Arthritis    left shoulder, injected by Reita Chard 2012   Chest pain 05/08/2015   Chest pain at rest 02/05/2012   Normal Lexiscan  normal enzymes during June 2013 admission for chest pain ([facility])   Complication of anesthesia    Sats dropped during cataract surgery   Hyperlipidemia     hypertriglyceridemia   Hypertension    Menopause, premature    at age 47   Obesity (BMI 30-39.9)    PAF (paroxysmal atrial fibrillation) (HCC)    R/T low potassium   Sleep apnea    CPAP    Past Surgical History:  Procedure Laterality Date   CATARACT EXTRACTION W/ INTRAOCULAR LENS  IMPLANT, BILATERAL     CHOLECYSTECTOMY  1982   COLONOSCOPY WITH PROPOFOL N/A 04/06/2017   Procedure: COLONOSCOPY WITH PROPOFOL;  Surgeon: Midge Minium, MD;  Location: [facility];  Service: Gastroenterology;  Laterality: N/A;  sleep apnea   COLONOSCOPY WITH PROPOFOL N/A 05/23/2022   Procedure: COLONOSCOPY WITH PROPOFOL;  Surgeon: Midge Minium, MD;  Location: [facility];  Service: Endoscopy;  Laterality: N/A;  sleep apnea   FOOT SURGERY Right    tumor   herniated disc     TUBAL LIGATION      Family History  Problem Relation Age of Onset   Cancer Mother    Alcohol abuse Mother    Cancer Father    COPD Father    Alcohol abuse Father    Heart disease Father    Hypertension Father    Hyperlipidemia Father    Early death Sister    COPD Sister    Heart disease Sister    Breast cancer Neg Hx     SOCIAL HX:  reports that she has never smoked. She has never used smokeless tobacco. She reports that she does not drink alcohol and does not use drugs.    Current Outpatient Medications:    aspirin 81 MG tablet, Take 81 mg by mouth daily., Disp: , Rfl:    celecoxib (CELEBREX) 200 MG capsule, Take 1 capsule (200 mg total) by mouth daily., Disp: 30 capsule, Rfl: 2   cyclobenzaprine (FLEXERIL) 5 MG tablet, Take 1 tablet (5 mg total) by mouth at bedtime as needed., Disp: 30 tablet, Rfl: 0   DANDELION PO, Take by mouth daily., Disp: , Rfl:    Iron, Ferrous Sulfate, 325 (65 Fe) MG TABS, Take 325 mg by mouth daily., Disp: 90 tablet, Rfl: 1   MILK THISTLE PO, Take by mouth daily., Disp: , Rfl:    Semaglutide, 1 MG/DOSE, 4 MG/3ML SOPN, Inject 1 mg as directed once a week., Disp: 3 mL, Rfl: 2    TURMERIC PO, Take by mouth daily., Disp: , Rfl:    VITAMIN D PO, Take by mouth., Disp: , Rfl:    telmisartan (MICARDIS) 20 MG tablet, Take 2 tablets (40 mg total) by mouth daily., Disp: 90 tablet, Rfl: 1  EXAM:  VITALS per patient if applicable:  GENERAL: alert, oriented, appears well and in no acute distress  HEENT: atraumatic, conjunttiva clear, no obvious abnormalities on inspection of external nose and ears  NECK: normal movements of the head and neck  LUNGS: on inspection no signs of respiratory distress, breathing rate appears normal, no obvious gross SOB, gasping or wheezing  CV: no obvious cyanosis  MS: moves all visible extremities without noticeable abnormality  PSYCH/NEURO: pleasant and cooperative, no obvious depression or anxiety, speech and thought processing grossly intact  ASSESSMENT AND PLAN: B12 deficiency Assessment & Plan: Diagnosis confirmed with elevated MMA>  Continue monthly injections given positive IF antibody   Orders: -     B12 and Folate Panel; Future  Tubular adenoma of colon  Shortness of breath Assessment & Plan: Her symptoms have resolved with50 lb  weight loss.    A restrictive pattern was  noted on home sleep study PFTs;  she ambulated  without ambulatory desaturations    OSA (obstructive sleep apnea) Assessment & Plan: Repeat sleep study done at home by [place] reviewed with patient  confirms persistent OSA despite 50 lb weight loss.   CPAP ordered    Primary hypertension -     Comprehensive metabolic panel; Future  Hyperlipidemia with target LDL less than 100 Assessment & Plan: Her cholesterol remains low with  dietary changes.  No medication indicated.   Lab Results  Component Value Date   CHOL 150 12/08/2022   HDL 49.80 12/08/2022   LDLCALC 88 12/08/2022   LDLDIRECT 92.0 12/08/2022   TRIG 58.0 12/08/2022   CHOLHDL 3 12/08/2022     Orders: -     Lipid Panel w/reflex Direct LDL; Future  Morbid obesity (HCC) Assessment  & Plan: With OSA and restrictive pattern of lung disease on recent PFTS.  She lost 50 lbs with the use of Wegovy but has not been able to contiue the medication  since  [facility] decided  to stop covering it for their employees. .  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  Encouraged to consider paying OOP for Ozempic since the pens are able to be manipulated to deliver lower doses .  Website info given.  http://lester.info/   This website  offers discounts  for ozempic.     Other orders -     Telmisartan; Take 2 tablets (40 mg total) by mouth daily.  Dispense: 90 tablet; Refill: 1      I discussed the assessment and treatment plan with the patient. The patient was provided an opportunity to ask questions and all were answered. The patient agreed with the plan and demonstrated an understanding of the instructions.   The patient was advised to call back or seek an in-person evaluation if the symptoms worsen or if the condition fails to improve as anticipated.   I spent 30 minutes dedicated to the care of this patient on the date of this encounter to include pre-visit review of her medical history,  review of recent labs and recent home sleep study, PFTs and 6 minute walk. In   Face-to-face  virtual visit  with the patient , and post visit ordering of testing and therapeutics.    Sherlene Shams, MD

## 2023-02-23 NOTE — Assessment & Plan Note (Signed)
Resolved with significant weight loss  restrictive pattern noted on home sleep study PFTs without ambulatory desats/

## 2023-02-24 NOTE — Assessment & Plan Note (Signed)
With OSA and restrictive pattern of lung disease on recent PFTS.  She lost 50 lbs with the use of Wegovy but has not been able to contiue the medication  since  [facility] decided  to stop covering it for their employees. .  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  Encouraged to consider paying OOP for Ozempic since the pens are able to be manipulated to deliver lower doses .  Website info given.  http://lester.info/   This website  offers discounts for ozempic.

## 2023-02-24 NOTE — Assessment & Plan Note (Signed)
Diagnosis confirmed with elevated MMA>  Continue monthly injections given positive IF antibody

## 2023-02-24 NOTE — Assessment & Plan Note (Signed)
Her cholesterol remains low with  dietary changes.  No medication indicated.   Lab Results  Component Value Date   CHOL 150 12/08/2022   HDL 49.80 12/08/2022   LDLCALC 88 12/08/2022   LDLDIRECT 92.0 12/08/2022   TRIG 58.0 12/08/2022   CHOLHDL 3 12/08/2022

## 2023-03-10 ENCOUNTER — Other Ambulatory Visit: Payer: Self-pay

## 2023-03-12 ENCOUNTER — Ambulatory Visit: Payer: Commercial Managed Care - PPO

## 2023-03-12 ENCOUNTER — Other Ambulatory Visit: Payer: Self-pay

## 2023-03-17 ENCOUNTER — Ambulatory Visit (INDEPENDENT_AMBULATORY_CARE_PROVIDER_SITE_OTHER): Payer: Commercial Managed Care - PPO

## 2023-03-17 DIAGNOSIS — E538 Deficiency of other specified B group vitamins: Secondary | ICD-10-CM

## 2023-03-17 MED ORDER — CYANOCOBALAMIN 1000 MCG/ML IJ SOLN
1000.0000 ug | Freq: Once | INTRAMUSCULAR | Status: AC
Start: 2023-03-17 — End: 2023-03-17
  Administered 2023-03-17: 1000 ug via INTRAMUSCULAR

## 2023-03-17 NOTE — Progress Notes (Signed)
Patient arrived for a B12 injection and it was administered into her right deltoid. Patient tolerated the injection well and did not show any signs of distress or voice any concerns. 

## 2023-03-18 DIAGNOSIS — G4733 Obstructive sleep apnea (adult) (pediatric): Secondary | ICD-10-CM | POA: Diagnosis not present

## 2023-04-02 ENCOUNTER — Encounter (INDEPENDENT_AMBULATORY_CARE_PROVIDER_SITE_OTHER): Payer: Self-pay

## 2023-04-06 ENCOUNTER — Other Ambulatory Visit: Payer: Self-pay

## 2023-04-06 ENCOUNTER — Other Ambulatory Visit: Payer: Self-pay | Admitting: Internal Medicine

## 2023-04-06 MED ORDER — CYCLOBENZAPRINE HCL 5 MG PO TABS
5.0000 mg | ORAL_TABLET | Freq: Every evening | ORAL | 0 refills | Status: DC | PRN
  Filled 2023-04-06: qty 30, 30d supply, fill #0

## 2023-04-18 DIAGNOSIS — G4733 Obstructive sleep apnea (adult) (pediatric): Secondary | ICD-10-CM | POA: Diagnosis not present

## 2023-04-21 ENCOUNTER — Ambulatory Visit (INDEPENDENT_AMBULATORY_CARE_PROVIDER_SITE_OTHER): Payer: Commercial Managed Care - PPO

## 2023-04-21 DIAGNOSIS — E538 Deficiency of other specified B group vitamins: Secondary | ICD-10-CM

## 2023-04-21 MED ORDER — CYANOCOBALAMIN 1000 MCG/ML IJ SOLN
1000.0000 ug | Freq: Once | INTRAMUSCULAR | Status: AC
Start: 2023-04-21 — End: 2023-04-21
  Administered 2023-04-21: 1000 ug via INTRAMUSCULAR

## 2023-04-21 NOTE — Progress Notes (Signed)
Patient presented for B 12 injection to left deltoid, patient voiced no concerns nor showed any signs of distress during injection. 

## 2023-05-17 ENCOUNTER — Other Ambulatory Visit: Payer: Self-pay | Admitting: Internal Medicine

## 2023-05-17 ENCOUNTER — Other Ambulatory Visit: Payer: Self-pay

## 2023-05-18 ENCOUNTER — Other Ambulatory Visit: Payer: Self-pay

## 2023-05-18 DIAGNOSIS — G4733 Obstructive sleep apnea (adult) (pediatric): Secondary | ICD-10-CM | POA: Diagnosis not present

## 2023-05-18 MED FILL — Celecoxib Cap 200 MG: ORAL | 30 days supply | Qty: 30 | Fill #0 | Status: AC

## 2023-05-18 MED FILL — Cyclobenzaprine HCl Tab 5 MG: ORAL | 30 days supply | Qty: 30 | Fill #0 | Status: AC

## 2023-05-25 ENCOUNTER — Ambulatory Visit: Payer: Commercial Managed Care - PPO

## 2023-05-25 ENCOUNTER — Other Ambulatory Visit: Payer: Commercial Managed Care - PPO

## 2023-05-25 DIAGNOSIS — I10 Essential (primary) hypertension: Secondary | ICD-10-CM

## 2023-05-25 DIAGNOSIS — E785 Hyperlipidemia, unspecified: Secondary | ICD-10-CM

## 2023-05-25 DIAGNOSIS — E538 Deficiency of other specified B group vitamins: Secondary | ICD-10-CM | POA: Diagnosis not present

## 2023-05-25 LAB — COMPREHENSIVE METABOLIC PANEL
ALT: 12 U/L (ref 0–35)
AST: 15 U/L (ref 0–37)
Albumin: 4.4 g/dL (ref 3.5–5.2)
Alkaline Phosphatase: 74 U/L (ref 39–117)
BUN: 18 mg/dL (ref 6–23)
CO2: 29 meq/L (ref 19–32)
Calcium: 9.3 mg/dL (ref 8.4–10.5)
Chloride: 100 meq/L (ref 96–112)
Creatinine, Ser: 0.79 mg/dL (ref 0.40–1.20)
GFR: 79.92 mL/min (ref >60.00)
Glucose, Bld: 105 mg/dL — ABNORMAL HIGH (ref 70–99)
Potassium: 4.3 meq/L (ref 3.5–5.1)
Sodium: 138 meq/L (ref 135–145)
Total Bilirubin: 0.6 mg/dL (ref 0.2–1.2)
Total Protein: 6.7 g/dL (ref 6.0–8.3)

## 2023-05-25 LAB — B12 AND FOLATE PANEL
Folate: 6.9 ng/mL (ref >5.9)
Vitamin B-12: >1501 pg/mL — ABNORMAL HIGH (ref 211–911)

## 2023-05-25 MED ORDER — CYANOCOBALAMIN 1000 MCG/ML IJ SOLN
1000.0000 ug | Freq: Once | INTRAMUSCULAR | Status: AC
Start: 2023-05-25 — End: 2023-05-25
  Administered 2023-05-25: 1000 ug via INTRAMUSCULAR

## 2023-05-25 NOTE — Progress Notes (Signed)
After obtaining consent, and per orders of Dr. Tullo, injection of B-12 given IM in right deltoid by Theron Cumbie Lynn. Patient tolerated injection well.  

## 2023-05-26 ENCOUNTER — Encounter: Payer: Self-pay | Admitting: Internal Medicine

## 2023-05-26 ENCOUNTER — Telehealth: Payer: Self-pay

## 2023-05-26 ENCOUNTER — Other Ambulatory Visit: Payer: Self-pay

## 2023-05-26 ENCOUNTER — Ambulatory Visit: Payer: Commercial Managed Care - PPO | Admitting: Internal Medicine

## 2023-05-26 VITALS — BP 122/66 | HR 60 | Ht 64.0 in | Wt 207.4 lb

## 2023-05-26 DIAGNOSIS — I10 Essential (primary) hypertension: Secondary | ICD-10-CM

## 2023-05-26 DIAGNOSIS — Z23 Encounter for immunization: Secondary | ICD-10-CM | POA: Diagnosis not present

## 2023-05-26 DIAGNOSIS — R7303 Prediabetes: Secondary | ICD-10-CM

## 2023-05-26 DIAGNOSIS — E538 Deficiency of other specified B group vitamins: Secondary | ICD-10-CM | POA: Diagnosis not present

## 2023-05-26 DIAGNOSIS — Z0000 Encounter for general adult medical examination without abnormal findings: Secondary | ICD-10-CM

## 2023-05-26 LAB — POCT GLYCOSYLATED HEMOGLOBIN (HGB A1C): Hemoglobin A1C: 4.8 % (ref 4.0–5.6)

## 2023-05-26 LAB — LIPID PANEL W/REFLEX DIRECT LDL
Cholesterol: 167 mg/dL (ref <200)
HDL: 60 mg/dL (ref >=50)
LDL Cholesterol (Calc): 91 mg/dL
Non-HDL Cholesterol (Calc): 107 mg/dL (ref <130)
Total CHOL/HDL Ratio: 2.8 (calc) (ref <5.0)
Triglycerides: 70 mg/dL (ref <150)

## 2023-05-26 MED ORDER — SEMAGLUTIDE-WEIGHT MANAGEMENT 0.25 MG/0.5ML ~~LOC~~ SOAJ
0.2500 mg | SUBCUTANEOUS | 0 refills | Status: AC
  Filled 2023-05-26: qty 2, 28d supply, fill #0

## 2023-05-26 MED ORDER — OMEPRAZOLE 20 MG PO CPDR
20.0000 mg | DELAYED_RELEASE_CAPSULE | Freq: Every day | ORAL | 3 refills | Status: AC
  Filled 2023-05-26: qty 30, 30d supply, fill #0
  Filled 2023-06-30 – 2023-11-21 (×2): qty 30, 30d supply, fill #1

## 2023-05-26 MED ORDER — TELMISARTAN 40 MG PO TABS
40.0000 mg | ORAL_TABLET | Freq: Every day | ORAL | 1 refills | Status: DC
  Filled 2023-05-26: qty 90, 90d supply, fill #0
  Filled 2023-11-21: qty 90, 90d supply, fill #1

## 2023-05-26 MED ORDER — CYCLOBENZAPRINE HCL 5 MG PO TABS
5.0000 mg | ORAL_TABLET | Freq: Every evening | ORAL | 1 refills | Status: AC | PRN
  Filled 2023-05-26 – 2023-10-13 (×2): qty 90, 90d supply, fill #0
  Filled 2024-04-03: qty 90, 90d supply, fill #1

## 2023-05-26 NOTE — Assessment & Plan Note (Signed)

## 2023-05-26 NOTE — Progress Notes (Signed)
Patient ID: Jessica Clayton, female    DOB: October 21, 1960  Age: 62 y.o. MRN: [id number]  The patient is here for annual preventive examination and management of other chronic and acute problems.   The risk factors are reflected in the social history.   The roster of all physicians providing medical care to patient - is listed in the Snapshot section of the chart.   Activities of daily living:  The patient is 100% independent in all ADLs: dressing, toileting, feeding as well as independent mobility   Home safety : The patient has smoke detectors in the home. They wear seatbelts.  There are no unsecured firearms at home. There is no violence in the home.    There is no risks for hepatitis, STDs or HIV. There is no   history of blood transfusion. They have no travel history to infectious disease endemic areas of the world.   The patient has seen their dentist in the last six month. They have seen their eye doctor in the last year. The patinet  denies slight hearing difficulty with regard to whispered voices and some television programs.  They have deferred audiologic testing in the last year.  They do not  have excessive sun exposure. Discussed the need for sun protection: hats, long sleeves and use of sunscreen if there is significant sun exposure.    Diet: the importance of a healthy diet is discussed. They do have a healthy diet.   The benefits of regular aerobic exercise were discussed. The patient  exercises  3 to 5 days per week  for  60 minutes.    Depression screen: there are no signs or vegative symptoms of depression- irritability, change in appetite, anhedonia, sadness/tearfullness.   The following portions of the patient's history were reviewed and updated as appropriate: allergies, current medications, past family history, past medical history,  past surgical history, past social history  and problem list.   Visual acuity was not assessed per patient preference since the patient  has regular follow up with an  ophthalmologist. Hearing and body mass index were assessed and reviewed.    During the course of the visit the patient was educated and counseled about appropriate screening and preventive services including : fall prevention , diabetes screening, nutrition counseling, colorectal cancer screening, and recommended immunizations.    Chief Complaint:  Obesity:  with weight gain after stopping wegovy due to cost. Would like to resume      Review of Symptoms  Patient denies headache, fevers, malaise, unintentional weight loss, skin rash, eye pain, sinus congestion and sinus pain, sore throat, dysphagia,  hemoptysis , cough, dyspnea, wheezing, chest pain, palpitations, orthopnea, edema, abdominal pain, nausea, melena, diarrhea, constipation, flank pain, dysuria, hematuria, urinary  Frequency, nocturia, numbness, tingling, seizures,  Focal weakness, Loss of consciousness,  Tremor, insomnia, depression, anxiety, and suicidal ideation.    Physical Exam:  BP 122/66   Pulse 60   Ht 5\' 4"  (1.626 m)   Wt 207 lb 6.4 oz (94.1 kg)   SpO2 98%   BMI 35.60 kg/m    Physical Exam Vitals reviewed.  Constitutional:      General: She is not in acute distress.    Appearance: Normal appearance. She is well-developed and normal weight. She is not ill-appearing, toxic-appearing or diaphoretic.  HENT:     Head: Normocephalic.     Right Ear: Tympanic membrane, ear canal and external ear normal. There is no impacted cerumen.     Left Ear:  Tympanic membrane, ear canal and external ear normal. There is no impacted cerumen.     Nose: Nose normal.     Mouth/Throat:     Mouth: Mucous membranes are moist.     Pharynx: Oropharynx is clear.  Eyes:     General: No scleral icterus.       Right eye: No discharge.        Left eye: No discharge.     Conjunctiva/sclera: Conjunctivae normal.     Pupils: Pupils are equal, round, and reactive to light.  Neck:     Thyroid: No thyromegaly.      Vascular: No carotid bruit or JVD.  Cardiovascular:     Rate and Rhythm: Normal rate and regular rhythm.     Heart sounds: Normal heart sounds.  Pulmonary:     Effort: Pulmonary effort is normal. No respiratory distress.     Breath sounds: Normal breath sounds.  Chest:  Breasts:    Breasts are symmetrical.     Right: Normal. No swelling, inverted nipple, mass, nipple discharge, skin change or tenderness.     Left: Normal. No swelling, inverted nipple, mass, nipple discharge, skin change or tenderness.  Abdominal:     General: Bowel sounds are normal.     Palpations: Abdomen is soft. There is no mass.     Tenderness: There is no abdominal tenderness. There is no guarding or rebound.  Musculoskeletal:        General: Normal range of motion.     Cervical back: Normal range of motion and neck supple.  Lymphadenopathy:     Cervical: No cervical adenopathy.     Upper Body:     Right upper body: No supraclavicular, axillary or pectoral adenopathy.     Left upper body: No supraclavicular, axillary or pectoral adenopathy.  Skin:    General: Skin is warm and dry.  Neurological:     General: No focal deficit present.     Mental Status: She is alert and oriented to person, place, and time. Mental status is at baseline.  Psychiatric:        Mood and Affect: Mood normal.        Behavior: Behavior normal.        Thought Content: Thought content normal.        Judgment: Judgment normal.    Assessment and Plan: Prediabetes -     POCT glycosylated hemoglobin (Hb A1C)  Need for diphtheria-tetanus-pertussis (Tdap) vaccine -     Tdap vaccine greater than or equal to 7yo IM  B12 deficiency Assessment & Plan: Continue monthly injections    Visit for preventive health examination Assessment & Plan: age appropriate education and counseling updated, referrals for preventative services and immunizations addressed, dietary and smoking counseling addressed, most recent labs reviewed.  I  have personally reviewed and have noted:   1) the patient's medical and social history 2) The pt's use of alcohol, tobacco, and illicit drugs 3) The patient's current medications and supplements 4) Functional ability including ADL's, fall risk, home safety risk, hearing and visual impairment 5) Diet and physical activities 6) Evidence for depression or mood disorder 7) The patient's height, weight, and BMI have been recorded in the chart   I have made referrals, and provided counseling and education based on review of the above    Primary hypertension Assessment & Plan: Well controlled on current regimen of low dose telmisartan. . Renal function is due, no changes today.    Other orders -  Telmisartan; Take 1 tablet (40 mg total) by mouth daily.  Dispense: 90 tablet; Refill: 1 -     Cyclobenzaprine HCl; Take 1 tablet (5 mg total) by mouth at bedtime as needed.  Dispense: 90 tablet; Refill: 1 -     Omeprazole; Take 1 capsule (20 mg total) by mouth daily.  Dispense: 30 capsule; Refill: 3 -     Semaglutide-Weight Management; Inject 0.25 mg into the skin once a week for 28 days.  Dispense: 2 mL; Refill: 0    No follow-ups on file.  Sherlene Shams, MD

## 2023-05-26 NOTE — Patient Instructions (Addendum)
You received your TdaP vaccination today (good for ten years)  4 weeks of Wegovy  samples  given .  If the website does not offer enough of a discount ,  try the following sourdes   1)  http://lester.info/   This website  offers discounts for ozempic.   2)  [employer] (for zepbound)  3)  [employer] offers generic ozempic/wegovy

## 2023-05-26 NOTE — Assessment & Plan Note (Signed)
Well controlled on current regimen of low dose telmisartan. . Renal function is due, no changes today.

## 2023-05-26 NOTE — Assessment & Plan Note (Signed)
Continue monthly injections.  ?

## 2023-05-26 NOTE — Telephone Encounter (Signed)
Medication Samples have been provided to the patient.  Drug name: [id number]       Strength: 0.25 mg        Qty: 1 box  LOT: [id number]  Exp.Date: 01/15/2024  Dosing instructions: Inject 0.25 mg into skin once weekly.   The patient has been instructed regarding the correct time, dose, and frequency of taking this medication, including desired effects and most common side effects.   Jessica Clayton 4:18 PM 05/26/2023

## 2023-05-27 ENCOUNTER — Other Ambulatory Visit: Payer: Self-pay

## 2023-05-28 ENCOUNTER — Other Ambulatory Visit: Payer: Self-pay

## 2023-06-18 DIAGNOSIS — G4733 Obstructive sleep apnea (adult) (pediatric): Secondary | ICD-10-CM | POA: Diagnosis not present

## 2023-06-19 DIAGNOSIS — G4733 Obstructive sleep apnea (adult) (pediatric): Secondary | ICD-10-CM | POA: Diagnosis not present

## 2023-06-25 ENCOUNTER — Ambulatory Visit: Payer: Commercial Managed Care - PPO

## 2023-06-25 DIAGNOSIS — E538 Deficiency of other specified B group vitamins: Secondary | ICD-10-CM

## 2023-06-25 MED ORDER — CYANOCOBALAMIN 1000 MCG/ML IJ SOLN
1000.0000 ug | Freq: Once | INTRAMUSCULAR | Status: AC
Start: 2023-06-25 — End: 2023-06-25
  Administered 2023-06-25: 1000 ug via INTRAMUSCULAR

## 2023-06-25 NOTE — Progress Notes (Signed)
Patient presented for B 12 injection to left deltoid, patient voiced no concerns nor showed any signs of distress during injection. 

## 2023-06-30 MED FILL — Celecoxib Cap 200 MG: ORAL | 30 days supply | Qty: 30 | Fill #1 | Status: CN

## 2023-07-13 ENCOUNTER — Other Ambulatory Visit: Payer: Self-pay

## 2023-07-18 DIAGNOSIS — G4733 Obstructive sleep apnea (adult) (pediatric): Secondary | ICD-10-CM | POA: Diagnosis not present

## 2023-07-27 ENCOUNTER — Ambulatory Visit: Payer: Commercial Managed Care - PPO

## 2023-07-27 NOTE — Progress Notes (Unsigned)
Pt presented for their vitamin B12 injection. Pt was identified through two identifiers. Pt tolerated shot well in their left or right deltoid.  

## 2023-08-18 DIAGNOSIS — G4733 Obstructive sleep apnea (adult) (pediatric): Secondary | ICD-10-CM | POA: Diagnosis not present

## 2023-09-18 ENCOUNTER — Ambulatory Visit: Payer: Commercial Managed Care - PPO

## 2023-09-18 DIAGNOSIS — G4733 Obstructive sleep apnea (adult) (pediatric): Secondary | ICD-10-CM | POA: Diagnosis not present

## 2023-09-20 DIAGNOSIS — G4733 Obstructive sleep apnea (adult) (pediatric): Secondary | ICD-10-CM | POA: Diagnosis not present

## 2023-09-24 ENCOUNTER — Ambulatory Visit (INDEPENDENT_AMBULATORY_CARE_PROVIDER_SITE_OTHER): Payer: Commercial Managed Care - PPO

## 2023-09-24 DIAGNOSIS — E538 Deficiency of other specified B group vitamins: Secondary | ICD-10-CM | POA: Diagnosis not present

## 2023-09-24 MED ORDER — CYANOCOBALAMIN 1000 MCG/ML IJ SOLN
1000.0000 ug | Freq: Once | INTRAMUSCULAR | Status: AC
Start: 2023-09-24 — End: 2023-09-24
  Administered 2023-09-24: 1000 ug via INTRAMUSCULAR

## 2023-09-24 NOTE — Progress Notes (Signed)
 Patient presented for B 12 injection to right deltoid, patient voiced no concerns nor showed any signs of distress during injection.

## 2023-10-13 ENCOUNTER — Other Ambulatory Visit: Payer: Self-pay

## 2023-10-13 MED FILL — Celecoxib Cap 200 MG: ORAL | 30 days supply | Qty: 30 | Fill #1 | Status: AC

## 2023-10-16 DIAGNOSIS — G4733 Obstructive sleep apnea (adult) (pediatric): Secondary | ICD-10-CM | POA: Diagnosis not present

## 2023-10-19 ENCOUNTER — Telehealth: Payer: Self-pay | Admitting: Internal Medicine

## 2023-10-19 NOTE — Telephone Encounter (Signed)
 FROM MyChart:  Jessica Clayton  P [facility] Admin20 minutes ago (6:55 AM)   Appointment for: Jessica Clayton ([id number]) Visit type: OFFICE VISIT [phone]) 10/22/2023 10:00 AM (30 minutes) with Sherlene Shams in [facility]   Patient comments: SHORTNESS OF BREATH/ FEET SWELLING

## 2023-10-19 NOTE — Telephone Encounter (Signed)
 Noted.

## 2023-10-22 ENCOUNTER — Other Ambulatory Visit: Payer: Self-pay

## 2023-10-22 ENCOUNTER — Telehealth: Payer: Self-pay

## 2023-10-22 ENCOUNTER — Ambulatory Visit

## 2023-10-22 ENCOUNTER — Ambulatory Visit: Payer: Commercial Managed Care - PPO

## 2023-10-22 ENCOUNTER — Emergency Department
Admission: EM | Admit: 2023-10-22 | Discharge: 2023-10-23 | Disposition: A | Discharge status: Home / Self Care | Attending: Emergency Medicine | Admitting: Emergency Medicine

## 2023-10-22 ENCOUNTER — Ambulatory Visit
Admission: RE | Admit: 2023-10-22 | Discharge: 2023-10-22 | Disposition: A | Discharge status: Home / Self Care | Source: Ambulatory Visit | Attending: Internal Medicine | Admitting: Internal Medicine

## 2023-10-22 ENCOUNTER — Encounter: Payer: Self-pay | Admitting: Internal Medicine

## 2023-10-22 ENCOUNTER — Telehealth: Payer: Self-pay | Admitting: Family

## 2023-10-22 ENCOUNTER — Emergency Department

## 2023-10-22 ENCOUNTER — Ambulatory Visit: Admitting: Internal Medicine

## 2023-10-22 DIAGNOSIS — R0602 Shortness of breath: Secondary | ICD-10-CM

## 2023-10-22 DIAGNOSIS — G4733 Obstructive sleep apnea (adult) (pediatric): Secondary | ICD-10-CM | POA: Diagnosis not present

## 2023-10-22 DIAGNOSIS — R918 Other nonspecific abnormal finding of lung field: Secondary | ICD-10-CM | POA: Diagnosis not present

## 2023-10-22 DIAGNOSIS — R0600 Dyspnea, unspecified: Secondary | ICD-10-CM | POA: Diagnosis not present

## 2023-10-22 DIAGNOSIS — M7989 Other specified soft tissue disorders: Secondary | ICD-10-CM | POA: Diagnosis not present

## 2023-10-22 DIAGNOSIS — R7989 Other specified abnormal findings of blood chemistry: Secondary | ICD-10-CM | POA: Insufficient documentation

## 2023-10-22 DIAGNOSIS — R0789 Other chest pain: Secondary | ICD-10-CM | POA: Diagnosis not present

## 2023-10-22 DIAGNOSIS — M79662 Pain in left lower leg: Secondary | ICD-10-CM | POA: Diagnosis not present

## 2023-10-22 DIAGNOSIS — E538 Deficiency of other specified B group vitamins: Secondary | ICD-10-CM

## 2023-10-22 LAB — RESP PANEL BY RT-PCR (RSV, FLU A&B, COVID)  RVPGX2
Influenza A by PCR: NEGATIVE
Influenza B by PCR: NEGATIVE
Resp Syncytial Virus by PCR: NEGATIVE
SARS Coronavirus 2 by RT PCR: NEGATIVE

## 2023-10-22 LAB — BASIC METABOLIC PANEL
Anion gap: 10 (ref 5–15)
BUN: 12 mg/dL (ref 6–23)
BUN: 10 mg/dL (ref 8–23)
CO2: 29 meq/L (ref 19–32)
CO2: 25 mmol/L (ref 22–32)
Calcium: 9 mg/dL (ref 8.4–10.5)
Calcium: 8.6 mg/dL — ABNORMAL LOW (ref 8.9–10.3)
Chloride: 105 meq/L (ref 96–112)
Chloride: 105 mmol/L (ref 98–111)
Creatinine, Ser: 0.71 mg/dL (ref 0.40–1.20)
Creatinine, Ser: 0.79 mg/dL (ref 0.44–1.00)
GFR, Estimated: >60 mL/min (ref >60)
GFR: 90.58 mL/min (ref >60.00)
Glucose, Bld: 104 mg/dL — ABNORMAL HIGH (ref 70–99)
Glucose, Bld: 104 mg/dL — ABNORMAL HIGH (ref 70–99)
Potassium: 3.8 meq/L (ref 3.5–5.1)
Potassium: 3.7 mmol/L (ref 3.5–5.1)
Sodium: 141 meq/L (ref 135–145)
Sodium: 140 mmol/L (ref 135–145)

## 2023-10-22 LAB — CBC
HCT: 40.2 % (ref 36.0–46.0)
Hemoglobin: 13 g/dL (ref 12.0–15.0)
MCH: 30.5 pg (ref 26.0–34.0)
MCHC: 32.3 g/dL (ref 30.0–36.0)
MCV: 94.4 fL (ref 80.0–100.0)
Platelets: 271 10*3/uL (ref 150–400)
RBC: 4.26 MIL/uL (ref 3.87–5.11)
RDW: 13 % (ref 11.5–15.5)
WBC: 6.3 10*3/uL (ref 4.0–10.5)
nRBC: 0 % (ref 0.0–0.2)

## 2023-10-22 LAB — TROPONIN I (HIGH SENSITIVITY)
Troponin I (High Sensitivity): 8 ng/L (ref <18)
Troponin I (High Sensitivity): 10 ng/L (ref <18)

## 2023-10-22 LAB — D-DIMER, QUANTITATIVE: D-Dimer, Quant: 0.52 ug{FEU}/mL — ABNORMAL HIGH

## 2023-10-22 LAB — BRAIN NATRIURETIC PEPTIDE
B Natriuretic Peptide: 284.1 pg/mL — ABNORMAL HIGH (ref 0.0–100.0)
Pro B Natriuretic peptide (BNP): 296 pg/mL — ABNORMAL HIGH (ref 0.0–100.0)

## 2023-10-22 MED ORDER — FUROSEMIDE 20 MG PO TABS
20.0000 mg | ORAL_TABLET | Freq: Every day | ORAL | 0 refills | Status: DC
Start: 2023-10-22 — End: 2023-10-28
  Filled 2023-10-22: qty 4, 4d supply, fill #0

## 2023-10-22 MED ORDER — IOHEXOL 350 MG/ML SOLN
100.0000 mL | Freq: Once | INTRAVENOUS | Status: AC | PRN
  Administered 2023-10-22: 100 mL via INTRAVENOUS

## 2023-10-22 MED ORDER — CYANOCOBALAMIN 1000 MCG/ML IJ SOLN
1000.0000 ug | Freq: Once | INTRAMUSCULAR | Status: AC
  Administered 2023-10-22: 1000 ug via INTRAMUSCULAR

## 2023-10-22 MED ORDER — IPRATROPIUM-ALBUTEROL 0.5-2.5 (3) MG/3ML IN SOLN
3.0000 mL | Freq: Once | RESPIRATORY_TRACT | Status: AC
  Administered 2023-10-22: 3 mL via RESPIRATORY_TRACT
  Filled 2023-10-22: qty 3

## 2023-10-22 MED ORDER — ALBUTEROL SULFATE HFA 108 (90 BASE) MCG/ACT IN AERS
2.0000 | INHALATION_SPRAY | Freq: Four times a day (QID) | RESPIRATORY_TRACT | 2 refills | Status: AC | PRN
  Filled 2023-10-22: qty 6.7, 25d supply, fill #0
  Filled 2023-11-21: qty 6.7, 25d supply, fill #1

## 2023-10-22 NOTE — ED Triage Notes (Signed)
 First Nurse Note: Patient sent from PCP for SOB and LLE swelling. Had outpatient [place] that was negative. Did have positive d-dimer and PCP wanting CTA.

## 2023-10-22 NOTE — Patient Instructions (Addendum)
 Your EKG is unchanged from your previous one done by Dr Mariah Milling  If we rule out DVT today,  I will send in a fluid pill and repeat the referral to Dr Mariah Milling    Here are some local and online Sources for zepbound and [phone] (direct pay)   [employer] CASH PAY FOR Jessica Clayton [place], [place] - [phone]  [name]  Drug store in [place] makes a compounded version of semiglutide

## 2023-10-22 NOTE — Assessment & Plan Note (Signed)
 Weight regain of 14 lbs noted since Wegovy was d/c due to cost.

## 2023-10-22 NOTE — Telephone Encounter (Signed)
 Spoke with pt to let her know that the [place] was negative for a DVT.

## 2023-10-22 NOTE — Assessment & Plan Note (Addendum)
 Repeat sleep study done at home by [facility] May 2024 reviewed with patient  confirms persistent OSA despite 50 lb weight loss.   CPAP was ordered

## 2023-10-22 NOTE — Telephone Encounter (Signed)
 Spoke with pt  She is home now , resting  Continues to feel SOB.   She started to have SOB acutely last week  Discussed negative left [place], elevated D dimer. BNP not exquisitely elevated, CXR with peribronchial thickening.    We discussed concern for pulmonary embolism.  I shared that I had collaborated with Dr. Lorin Picket, Dr. Darrick Huntsman in the office.  We agreed further evaluation is mandatory.  Patient agreeable to go to [facility].  She declines going by EMS.  She will call a close friend who will drive her there.  Given report to [facility] nurse

## 2023-10-22 NOTE — ED Provider Notes (Signed)
 [facility] Provider Note    Event Date/Time   First MD Initiated Contact with Patient 10/22/23 2042     (approximate)   History   Elevated d-dimer   HPI  Jessica Clayton is a 63 y.o. female who presents to the emergency department today because of concerns for elevated D-dimer that was found on outpatient blood work.  Patient went to her doctor today because of concerns for shortness of breath and some chest discomfort.  She states symptoms have been ongoing for a little over 1 week.  She will notice some central chest pressure with exertion.  Additionally she is felt like she has had some shortness of breath.  Additionally has noticed some lower extremity swelling.  She had ultrasound performed today which was negative for lower extremity DVT.  D-dimer however was elevated to primary care referred patient to the ED for CT scan.     Physical Exam   Triage Vital Signs: ED Triage Vitals  Encounter Vitals Group     BP 10/22/23 1837 (!) 169/74     Systolic BP Percentile --      Diastolic BP Percentile --      Pulse Rate 10/22/23 1837 63     Resp 10/22/23 1837 20     Temp 10/22/23 1837 98 F (36.7 C)     Temp src --      SpO2 10/22/23 1837 100 %     Weight --      Height --      Head Circumference --      Peak Flow --      Pain Score 10/22/23 1838 5     Pain Loc --      Pain Education --      Exclude from Growth Chart --     Most recent vital signs: Vitals:   10/22/23 1837  BP: (!) 169/74  Pulse: 63  Resp: 20  Temp: 98 F (36.7 C)  SpO2: 100%   General: Awake, alert, oriented. CV:  Good peripheral perfusion. Regular rate and rhythm. Resp:  Normal effort. Lungs clear, no crackles, no wheezing. Abd:  No distention.  Other:  No lower extremity pitting edema.    ED Results / Procedures / Treatments   Labs (all labs ordered are listed, but only abnormal results are displayed) Labs Reviewed  BASIC METABOLIC PANEL - Abnormal;  Notable for the following components:      Result Value   Glucose, Bld 104 (*)    Calcium 8.6 (*)    All other components within normal limits  BRAIN NATRIURETIC PEPTIDE - Abnormal; Notable for the following components:   B Natriuretic Peptide 284.1 (*)    All other components within normal limits  RESP PANEL BY RT-PCR (RSV, FLU A&B, COVID)  RVPGX2  CBC  TROPONIN I (HIGH SENSITIVITY)  TROPONIN I (HIGH SENSITIVITY)     EKG  I, Phineas Semen, attending physician, personally viewed and interpreted this EKG  EKG Time: 1844 Rate: 64 Rhythm: sinus rhythm  Axis: left axis deviation Intervals: qtc 464 QRS: low voltage ST changes: no st elevation Impression: abnormal ekg   RADIOLOGY I independently interpreted and visualized the CT angio. My interpretation: No PE Radiology interpretation:  IMPRESSION:  1. No evidence of pulmonary embolism or other acute intrathoracic  process.  2. Evidence of prior cholecystectomy.      PROCEDURES:  Critical Care performed: No    MEDICATIONS ORDERED IN ED: Medications  iohexol (OMNIPAQUE)  350 MG/ML injection 100 mL (100 mLs Intravenous Contrast Given 10/22/23 2103)     IMPRESSION / MDM / ASSESSMENT AND PLAN / ED COURSE  I reviewed the triage vital signs and the nursing notes.                              Differential diagnosis includes, but is not limited to, PE, pneumonia, anemia, CHF, ACS  Patient's presentation is most consistent with acute presentation with potential threat to life or bodily function.   The patient is on the cardiac monitor to evaluate for evidence of arrhythmia and/or significant heart rate changes.  Patient presented to the emergency department today because of concerns for elevated D-dimer.  Exam patient's lungs are clear to auscultation.  She does not have any pitting edema.  CT angio was performed.  Will trial breathing treatment while awaiting radiology read.  Initial troponin negative.  Repeat  troponin negative.  BNP was slightly elevated.  CT angio without PE or concerning abnormality.  I discussed the findings with patient.  She did feel better after breathing treatment.  Do think this likely is some bronchitis however given the slight elevation of BNP will trial Lasix for a few days.  Will give patient prescription of albuterol inhaler as well.  Discussed importance of follow-up.      FINAL CLINICAL IMPRESSION(S) / ED DIAGNOSES   Final diagnoses:  Shortness of breath     Note:  This document was prepared using Dragon voice recognition software and may include unintentional dictation errors.    Phineas Semen, MD 10/22/23 [phone]

## 2023-10-22 NOTE — ED Triage Notes (Signed)
 Pt to ED for left leg pain and swelling for 9 days. Reports shob x5 days and chest pressure started Saturday. Friend reports pt has been having to stop frequently when talking.  Had elevated d dimer today. Neg left leg [place]. Elevated BNP resulted today.  RR Even and unlabored at this time. NAD noted.

## 2023-10-22 NOTE — Progress Notes (Signed)
 Subjective:  Patient ID: Jessica Clayton, female    DOB: 21-Jun-1961  Age: 63 y.o. MRN: [id number]  CC: The primary encounter diagnosis was Morbid obesity (HCC). Diagnoses of OSA (obstructive sleep apnea), Shortness of breath, Pain of left calf, Acute dyspnea, and B12 deficiency were also pertinent to this visit.   HPI Jessica Clayton presents for  Chief Complaint  Patient presents with   shortness of breath and feet swelling    X 8 days. Stated that about 8 weeks ago she came down with norovirus then got an upper respiratory infection.     1) dyspnea:  acute on chronic   present for the past 8 days ,after recovering from Norovirus 2 months ago , lasted several days,  several weeks later developed COVID symptoms (cough  myalgias, fever)  tested for COVID and negative.  After acute illness resolved she developed dyspnea with sedentary activities and with talking. . Weight gain of 7 lbs (imprecise, different scales) but she Feels that she has had gained weight due to fluid retention.    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 Denies chest pain , but feels pressure with short walks from the parking lot to the hospital . No jaw pain., diaphoresis or nausea.  No recent trips of prlonged immobilizatoin    2) Chronic dyspnea:  exertional,  with Restrictive lung disease by prior PFTs,  and OSA, despite 50 lb wt loss.  Prior noninvasive cardiac workup with Dr Mariah Milling reviewed .     2) obesity: lost 50 lbs with Wegovy when it was covered by [facility],  has regained 14 lbs since medication was d/c due to cost.  Limited in exercise options due to bilateral knee pain . Applied for [employer] through a mail order pharmacy , has been billed to credit card $199/3 months but has not received the medication despite the charge coming through on credit card 3 months ago   4) Chronic left knee pain:   has  had I/A injections,  PT.  Pain still present in back of knee for several months  mobility has been severely restricted     Outpatient Medications Prior to Visit  Medication Sig Dispense Refill   aspirin 81 MG tablet Take 81 mg by mouth daily.     celecoxib (CELEBREX) 200 MG capsule Take 1 capsule (200 mg total) by mouth daily. 30 capsule 2   cyclobenzaprine (FLEXERIL) 5 MG tablet Take 1 tablet (5 mg total) by mouth at bedtime as needed. 90 tablet 1   DANDELION PO Take by mouth daily.     Iron, Ferrous Sulfate, 325 (65 Fe) MG TABS Take 325 mg by mouth daily. 90 tablet 1   MILK THISTLE PO Take by mouth daily.     omeprazole (PRILOSEC) 20 MG capsule Take 1 capsule (20 mg total) by mouth daily. 30 capsule 3   telmisartan (MICARDIS) 40 MG tablet Take 1 tablet (40 mg total) by mouth daily. 90 tablet 1   TURMERIC PO Take by mouth daily.     VITAMIN D PO Take by mouth.     No facility-administered medications prior to visit.    Review of Systems;  Patient denies headache, fevers, malaise, unintentional weight loss, skin rash, eye pain, sinus congestion and sinus pain, sore throat, dysphagia,  hemoptysis , cough, dyspnea, wheezing, chest pain, palpitations, orthopnea, edema, abdominal pain,  nausea, melena, diarrhea, constipation, flank pain, dysuria, hematuria, urinary  Frequency, nocturia, numbness, tingling, seizures,  Focal weakness, Loss of consciousness,  Tremor, insomnia, depression, anxiety, and suicidal ideation.      Objective:  BP 130/82   Pulse (!) 50   Ht 5\' 4"  (1.626 m)   Wt 223 lb 12.8 oz (101.5 kg)   SpO2 98%   BMI 38.42 kg/m   BP Readings from Last 3 Encounters:  10/22/23 130/82  05/26/23 122/66  12/08/22 124/66    Wt Readings from Last 3 Encounters:  10/22/23 223 lb 12.8 oz (101.5 kg)  05/26/23 207 lb 6.4 oz (94.1 kg)  02/23/23 193 lb (87.5 kg)    Physical Exam Constitutional:      Appearance: She is obese.  HENT:     Head: Normocephalic.      Mouth/Throat:     Mouth: Mucous membranes are moist.  Cardiovascular:     Rate and Rhythm: Normal rate.  Pulmonary:     Effort: Pulmonary effort is normal.     Breath sounds: Normal breath sounds.  Musculoskeletal:        General: Swelling present.     Cervical back: Normal range of motion.     Left lower leg: Edema present.       Legs:     Comments: Diffuse swelling of popliteal fossa , asymmetric edema of calves     Lab Results  Component Value Date   HGBA1C 4.8 05/26/2023   HGBA1C 5.1 05/21/2022   HGBA1C 5.1 12/06/2021    Lab Results  Component Value Date   CREATININE 0.79 05/25/2023   CREATININE 0.76 12/08/2022   CREATININE 0.73 05/21/2022    Lab Results  Component Value Date   WBC 5.5 12/06/2021   HGB 12.7 12/06/2021   HCT 37.4 12/06/2021   PLT 311 12/06/2021   GLUCOSE 105 (H) 05/25/2023   CHOL 167 05/25/2023   TRIG 70 05/25/2023   HDL 60 05/25/2023   LDLDIRECT 92.0 12/08/2022   LDLCALC 91 05/25/2023   ALT 12 05/25/2023   AST 15 05/25/2023   NA 138 05/25/2023   K 4.3 05/25/2023   CL 100 05/25/2023   CREATININE 0.79 05/25/2023   BUN 18 05/25/2023   CO2 29 05/25/2023   TSH 1.82 05/21/2022   HGBA1C 4.8 05/26/2023   MICROALBUR 2.9 (H) 12/08/2022    MM 3D SCREENING MAMMOGRAM BILATERAL BREAST Result Date: 01/07/2023 CLINICAL DATA:  Screening. EXAM: DIGITAL SCREENING BILATERAL MAMMOGRAM WITH TOMOSYNTHESIS AND CAD TECHNIQUE: Bilateral screening digital craniocaudal and mediolateral oblique mammograms were obtained. Bilateral screening digital breast tomosynthesis was performed. The images were evaluated with computer-aided detection. COMPARISON:  Previous exam(s). ACR Breast Density Category a: The breasts are almost entirely fatty. FINDINGS: There are no findings suspicious for malignancy. IMPRESSION: No mammographic evidence of malignancy. A result letter of this screening mammogram will be mailed directly to the patient. RECOMMENDATION: Screening mammogram in  one year. (Code:[id number]-B-01Y) BI-RADS CATEGORY  1: Negative. Electronically Signed   By: Sande Brothers M.D.   On: 01/07/2023 10:14    Assessment & Plan:  .Morbid obesity (HCC) Assessment & Plan: Weight regain of 14 lbs noted since Wegovy was d/c due to cost.    OSA (obstructive sleep apnea) Assessment & Plan: Repeat sleep study done at home by [facility] May 2024 reviewed with patient  confirms persistent OSA despite 50 lb weight loss.   CPAP was ordered    Shortness of breath -     Basic metabolic panel -  US Venous Img Lower Unilateral Left (DVT); Future -     EKG 12-Lead -     DG Chest 2 View; Future -     Brain natriuretic peptide  Pain of left calf -     US Venous Img Lower Unilateral Left (DVT); Future -     D-dimer, quantitative  Acute dyspnea Assessment & Plan: Ddx includes diastolic dysfunction,  DVT/PE , deconditioning . Ambulatory sats are excellent.   Unclear if weight gain is real or artifactual .   I have ordered and reviewed a 12 lead EKG and find that there are no acute changes and patient is in sinus rhythm.    chest  xray.  STAT D Dimer/BNP and LLE venous doppler ordered.    B12 deficiency -     Cyanocobalamin     In additional to time spend reading today's /EKG, I spent 34 minutes on the day of this face to face encounter reviewing patient's  most recent visit with cardiology,  orthopeidcs , prior relevant surgical and non surgical procedures, recent  labs and imaging studies, counseling on weight management,  reviewing the assessment and plan with patient, and post visit ordering and reviewing of  diagnostics and therapeutics with patient  .   Follow-up: No follow-ups on file.   Sherlene Shams, MD

## 2023-10-22 NOTE — Assessment & Plan Note (Addendum)
 Ddx includes diastolic dysfunction,  DVT/PE , deconditioning . Ambulatory sats are excellent.   Unclear if weight gain is real or artifactual .   I have ordered and reviewed a 12 lead EKG and find that there are no acute changes and patient is in sinus rhythm.    chest  xray.  STAT D Dimer/BNP and LLE venous doppler ordered.

## 2023-10-23 ENCOUNTER — Other Ambulatory Visit: Payer: Self-pay

## 2023-10-26 NOTE — Progress Notes (Unsigned)
 Cardiology Clinic Note   Date: 10/27/2023 ID: Matti, Killingsworth 1960/12/01, MRN [id number]  Primary Cardiologist:  None  Chief Complaint   Jessica Clayton is a 63 y.o. female who presents to the clinic today for evaluation of shortness of breath.   Patient Profile   Jessica Clayton is being seen to reestablish cardiac care at the request of Dr. Darrick Huntsman.     Past medical history significant for: Dyspnea. PAF. Onset August 2014. Hypertension. Hyperlipidemia. CT cardiac scoring 07/16/2015: Coronary calcium score 0. Lipid panel 05/25/2023: LDL 91, HDL 60, TG 70, total 167. OSA. Pulmonary nodules. Migraine headaches.  In summary, patient has a history of PAF with onset in August 2014.  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  She had significant symptoms while at work and set herself up on the telemetry monitor which showed A-fib.  She checked into the ED and EKG confirmed A-fib with RVR, 152 bpm.  She was started on Cardizem daily.  A few days prior to her office visit with Dr. Mariah Milling she had a significant episode of palpitations lasting several hours along with occasional episodes thereafter.  She reported associated dyspnea but no chest pain.  She had a normal stress test in June 2013.  And a normal venous ultrasound in March 2014 to rule out DVT.  At the time of her visit she was in normal sinus rhythm.  It was recommended she remain on Cardizem 120 mg daily with Xarelto and as needed diltiazem 30 mg 4 prolonged episodes of A-fib.  Upon follow-up at the end of September 2014 she reported she was not taking Cardizem or Xarelto but had continue taking diltiazem 30 mg daily.  At follow-up she reported no significant episodes.  At her last visit with Dr. Mariah Milling in November 2016 she reported dyspnea and chest discomfort particularly with climbing stairs.  She denied episodes of tachycardia or symptoms concerning for  arrhythmia.  In June 2017 Dr. Kate Sable contacted Dr. Mariah Milling regarding patient having vision changes described as flashes of light and transient vision loss.  Dr. Mariah Milling recommended carotid ultrasound and 30-day event monitor given history of PAF.  It does not appear these tests were performed.  Patient was seen by PCP, Dr. Darrick Huntsman, on 10/22/2023 with complaints of acute on chronic dyspnea x 8 days.  She reported 2 months prior she developed a norovirus that lasted several days and then several weeks later developed a URI (she tested negative for COVID).  She felt that she was holding onto extra fluid and reported unilateral lower extremity edema on the left for which she underwent unofficial lower extremity ultrasound which was negative for DVT.  Outpatient labs revealed proBNP was 296, D-dimer was 0.52 and patient was instructed to report to the ED.  Venous ultrasound was negative for left lower extremity DVT.  CTA chest PE protocol was negative for PE.  ED labs revealed BNP 284.1, negative respiratory panel, negative troponin x 2.  She was discharged with an albuterol inhaler and a few days of Lasix.     History of Present Illness    Today, patient reports lower extremity edema has improved since taking Lasix x 4 days provided at hospital discharge. She continues to have dyspnea with minimal exertion such as cleaning out the cat liter worsening over the last 12 days. She states she normally is very active and now feels like she has to rest frequently just  to get the trash cans up and down the driveway. Dyspnea is associated with chest tightness that resolves along with the dyspnea when she rests. She does not weigh daily but believes her normal weight is now around 217-220. She previously had a 50 lb weight loss on Ozempic but gained 30 lb back when her insurance would not cover it anymore. She denies palpitations or episodes of feeling like she is out of rhythm.   She is unclear about her family history. She  reports her father used to take NTG. Her sister passed away and autopsy revealed blockages but she was also a smoker. Patient has never smoked or consumed alcohol. She walks for exercise 15-20 minutes at a time when she can. This is limited by knee pain.     ROS: All other systems reviewed and are otherwise negative except as noted in History of Present Illness.  EKGs/Labs Reviewed       EKG from 10/22/2023 personally reviewed and showed sinus bradycardia, 51 bpm, nonspecific T-wave abnormality, incomplete RBBB. When compared to 07/09/2015 no significant changes.   05/25/2023: ALT 12; AST 15 10/22/2023: BUN 10; Creatinine, Ser 0.79; Potassium 3.7; Sodium 140   10/22/2023: Hemoglobin 13.0; WBC 6.3   10/22/2023: B Natriuretic Peptide 284.1; Pro B Natriuretic peptide (BNP) 296.0    Physical Exam    VS:  BP 126/78   Pulse (!) 56   Ht 5\' 4"  (1.626 m)   Wt 223 lb 12.8 oz (101.5 kg)   SpO2 97%   BMI 38.42 kg/m  , BMI Body mass index is 38.42 kg/m.  GEN: Well nourished, well developed, in no acute distress. Neck: No JVD or carotid bruits. Cardiac:  RRR. No murmurs. No rubs or gallops.   Respiratory:  Respirations regular and unlabored. Clear to auscultation without rales, wheezing or rhonchi. GI: Soft, nontender, nondistended. Extremities: Radials/DP/PT 2+ and equal bilaterally. No clubbing or cyanosis. Mild nonpitting edema bilateral lower extremities.   Skin: Warm and dry, no rash. Neuro: Strength intact.  Assessment & Plan   Dyspnea/lower extremity edema Patient reports lower extremity edema is improved since taking Lasix x 4 days. She reports brisk diuresis with prescribed dose. She continues to have dyspnea with minimal exertion such as cleaning out the cat liter or bring the trash cans up and down the driveway that is worsening over the last 12 days.  -Weigh daily. -BMP today. Will contact patient regarding resuming Lasix on an as needed basis (she will need a prescription).   -Schedule echo.  History of afib Onset August 2014.  Patient denies palpitations or episodes of arrhythmia. RRR on exam today.  -No further workup at this time.   Hypertension BP today 126/78. No report of headaches or dizziness.  -Continue telmisartan.  Disposition: BMP today. Echo. Return in 3-4 weeks after echo or sooner as needed.          Signed, Etta Grandchild. Jessica [place], DNP, NP-C

## 2023-10-27 ENCOUNTER — Encounter: Payer: Self-pay | Admitting: Student

## 2023-10-27 ENCOUNTER — Ambulatory Visit: Attending: Student | Admitting: Student

## 2023-10-27 VITALS — BP 126/78 | HR 56 | Ht 64.0 in | Wt 223.8 lb

## 2023-10-27 DIAGNOSIS — Z8679 Personal history of other diseases of the circulatory system: Secondary | ICD-10-CM | POA: Diagnosis not present

## 2023-10-27 DIAGNOSIS — R0609 Other forms of dyspnea: Secondary | ICD-10-CM | POA: Diagnosis not present

## 2023-10-27 DIAGNOSIS — I10 Essential (primary) hypertension: Secondary | ICD-10-CM | POA: Diagnosis not present

## 2023-10-27 DIAGNOSIS — Z79899 Other long term (current) drug therapy: Secondary | ICD-10-CM

## 2023-10-27 DIAGNOSIS — R600 Localized edema: Secondary | ICD-10-CM

## 2023-10-27 NOTE — Patient Instructions (Signed)
 Medication Instructions:  No changes at this time.   *If you need a refill on your cardiac medications before your next appointment, please call your pharmacy*   Lab Work: BMET today  If you have labs (blood work) drawn today and your tests are completely normal, you will receive your results only by: MyChart Message (if you have MyChart) OR A paper copy in the mail If you have any lab test that is abnormal or we need to change your treatment, we will call you to review the results.   Testing/Procedures: Your physician has requested that you have an echocardiogram. Echocardiography is a painless test that uses sound waves to create images of your heart. It provides your doctor with information about the size and shape of your heart and how well your heart's chambers and valves are working. This procedure takes approximately one hour. There are no restrictions for this procedure. Please do NOT wear cologne, perfume, aftershave, or lotions (deodorant is allowed). Please arrive 15 minutes prior to your appointment time.  Please note: We ask at that you not bring children with you during ultrasound (echo/ vascular) testing. Due to room size and safety concerns, children are not allowed in the ultrasound rooms during exams. Our front office staff cannot provide observation of children in our lobby area while testing is being conducted. An adult accompanying a patient to their appointment will only be allowed in the ultrasound room at the discretion of the ultrasound technician under special circumstances. We apologize for any inconvenience.    Follow-Up: At [facility], you and your health needs are our priority.  As part of our continuing mission to provide you with exceptional heart care, we have created designated Provider Care Teams.  These Care Teams include your primary Cardiologist (physician) and Advanced Practice Providers (APPs -  Physician Assistants and Nurse Practitioners)  who all work together to provide you with the care you need, when you need it.   Your next appointment:   Follow up after echocardiogram.  Provider:   Carlos Levering, NP

## 2023-10-28 ENCOUNTER — Other Ambulatory Visit: Payer: Self-pay

## 2023-10-28 ENCOUNTER — Other Ambulatory Visit: Payer: Self-pay | Admitting: Emergency Medicine

## 2023-10-28 LAB — BASIC METABOLIC PANEL
BUN/Creatinine Ratio: 16 (ref 12–28)
BUN: 14 mg/dL (ref 8–27)
CO2: 25 mmol/L (ref 20–29)
Calcium: 9.4 mg/dL (ref 8.7–10.3)
Chloride: 102 mmol/L (ref 96–106)
Creatinine, Ser: 0.88 mg/dL (ref 0.57–1.00)
Glucose: 91 mg/dL (ref 70–99)
Potassium: 4.2 mmol/L (ref 3.5–5.2)
Sodium: 141 mmol/L (ref 134–144)
eGFR: 74 mL/min/{1.73_m2} (ref >59)

## 2023-10-28 MED ORDER — FUROSEMIDE 20 MG PO TABS
20.0000 mg | ORAL_TABLET | Freq: Every day | ORAL | 3 refills | Status: DC | PRN
  Filled 2023-10-28: qty 90, 90d supply, fill #0
  Filled 2023-11-21: qty 90, 90d supply, fill #1

## 2023-11-16 DIAGNOSIS — G4733 Obstructive sleep apnea (adult) (pediatric): Secondary | ICD-10-CM | POA: Diagnosis not present

## 2023-11-21 ENCOUNTER — Other Ambulatory Visit: Payer: Self-pay | Admitting: Internal Medicine

## 2023-11-21 MED FILL — Celecoxib Cap 200 MG: ORAL | 30 days supply | Qty: 30 | Fill #2 | Status: AC

## 2023-11-22 ENCOUNTER — Other Ambulatory Visit: Payer: Self-pay

## 2023-11-23 ENCOUNTER — Other Ambulatory Visit: Payer: Self-pay

## 2023-11-24 ENCOUNTER — Ambulatory Visit: Attending: Student

## 2023-11-24 ENCOUNTER — Other Ambulatory Visit: Payer: Self-pay

## 2023-11-24 DIAGNOSIS — R0609 Other forms of dyspnea: Secondary | ICD-10-CM

## 2023-11-24 LAB — ECHOCARDIOGRAM COMPLETE
AR max vel: 2.64 cm2
AV Area VTI: 2.46 cm2
AV Area mean vel: 2.49 cm2
AV Mean grad: 6 mmHg
AV Peak grad: 10.9 mmHg
Ao pk vel: 1.65 m/s
Area-P 1/2: 3.53 cm2
S' Lateral: 2.75 cm

## 2023-12-01 NOTE — Progress Notes (Unsigned)
 Cardiology Clinic Note   Date: 12/03/2023 ID: Jessica, Clayton Nov 02, 1960, MRN [id number]  Primary Cardiologist:  Julien Nordmann, MD  Chief Complaint   Jessica Clayton is a 63 y.o. female who presents to the clinic today for follow up after testing.   Patient Profile   Jessica Clayton is followed by Dr. Mariah Milling for the history outlined below.      Past medical history significant for: Dyspnea. Echo 11/24/2023: EF 60 to 65%.  No RWMA.  Normal diastolic parameters.  Normal RV size/function.  No significant valvular abnormalities. PAF. Onset August 2014. Hypertension. Hyperlipidemia. CT cardiac scoring 07/16/2015: Coronary calcium score 0. Lipid panel 05/25/2023: LDL 91, HDL 60, TG 70, total 167. OSA. Pulmonary nodules. Migraine headaches.  In summary, patient has a history of PAF with onset in August 2014.  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  She had significant symptoms while at work and set herself up on the telemetry monitor which showed A-fib.  She checked into the ED and EKG confirmed A-fib with RVR, 152 bpm.  She was started on Cardizem daily.  A few days prior to her office visit with Dr. Mariah Milling she had a significant episode of palpitations lasting several hours along with occasional episodes thereafter.  She reported associated dyspnea but no chest pain.  She had a normal stress test in June 2013.  And a normal venous ultrasound in March 2014 to rule out DVT.  At the time of her visit she was in normal sinus rhythm.  It was recommended she remain on Cardizem 120 mg daily with Xarelto and as needed diltiazem 30 mg 4 prolonged episodes of A-fib.  Upon follow-up at the end of September 2014 she reported she was not taking Cardizem or Xarelto but had continue taking diltiazem 30 mg daily.  At follow-up she reported no significant episodes.  At her last visit with Dr. Mariah Milling in November 2016 she reported  dyspnea and chest discomfort particularly with climbing stairs.  She denied episodes of tachycardia or symptoms concerning for arrhythmia.  In June 2017 Dr. Kate Sable contacted Dr. Mariah Milling regarding patient having vision changes described as flashes of light and transient vision loss.  Dr. Mariah Milling recommended carotid ultrasound and 30-day event monitor given history of PAF.  It does not appear these tests were performed.  Patient was seen by PCP, Dr. Darrick Huntsman, on 10/22/2023 with complaints of acute on chronic dyspnea x 8 days.  She reported 2 months prior she developed a norovirus that lasted several days and then several weeks later developed a URI (she tested negative for COVID).  She felt that she was holding onto extra fluid and reported unilateral lower extremity edema on the left for which she underwent unofficial lower extremity ultrasound which was negative for DVT.  Outpatient labs revealed proBNP was 296, D-dimer was 0.52 and patient was instructed to report to the ED.  Venous ultrasound was negative for left lower extremity DVT.  CTA chest PE protocol was negative for PE.  ED labs revealed BNP 284.1, negative respiratory panel, negative troponin x 2.  She was discharged with an albuterol inhaler and a few days of Lasix.   Patient was last seen in the office by me on 10/27/2023 for evaluation of shortness of breath.  She reported improved lower extremity edema after taking Lasix for 4 days provided at hospital discharge.  She continued to have dyspnea with minimal exertion with  associated chest tightness that resolved with rest.  Labs were drawn and she was instructed to take Lasix 20 mg as needed for weight gain or lower extremity edema.  Echo demonstrated normal LV function as detailed above.     History of Present Illness    Today, patient reports overall she is doing well. She continues to have some dyspnea with heavier exertion. She feels her dyspnea is related to her weight. She felt she was  breathing better when she had lost weight and was under 200 lb.  She continues to take Lasix as needed. Her last dose was 3 days ago after working all weekend and doing a lot of sitting. Home weight has been stable. She denies chest pain, pressure or tightness. No palpitations. She does not participate in routine exercise. 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     ROS: All other systems reviewed and are otherwise negative except as noted in History of Present Illness.  EKGs/Labs Reviewed        05/25/2023: ALT 12; AST 15 10/27/2023: BUN 14; Creatinine, Ser 0.88; Potassium 4.2; Sodium 141   10/22/2023: Hemoglobin 13.0; WBC 6.3   No results found for requested labs within last 365 days.   10/22/2023: B Natriuretic Peptide 284.1; Pro B Natriuretic peptide (BNP) 296.0    Physical Exam    VS:  BP 130/68 (BP Location: Left Arm)   Pulse 65   Ht 5\' 4"  (1.626 m)   Wt 226 lb (102.5 kg)   SpO2 97%   BMI 38.79 kg/m  , BMI Body mass index is 38.79 kg/m.  GEN: Well nourished, well developed, in no acute distress. Neck: No JVD or carotid bruits. Cardiac:  RRR. No murmurs. No rubs or gallops.   Respiratory:  Respirations regular and unlabored. Clear to auscultation without rales, wheezing or rhonchi. GI: Soft, nontender, nondistended. Extremities: Radials/DP/PT 2+ and equal bilaterally. No clubbing or cyanosis. No edema.  Skin: Warm and dry, no rash. Neuro: Strength intact.  Assessment & Plan   Dyspnea/lower extremity edema Echo April 2025 showed normal LV/RV function, normal diastolic parameters, no RWMA, no significant valvular abnormalities.  Patient reports continued dyspnea with heavier exertion that she attributes to her weight. She feels she was breathing better when she was under 200 lb. She is working on weight loss. She does not participate in routine exercise but walks around the hospital when at work. Lower extremity edema has improved and home weight is stable. Last dose of  Lasix 3 days ago after working all weekend and doing a lot of sitting. Euvolemic and well compensated on exam.  - Continue to weigh daily. - Continue Lasix as needed. -BMP today.    History of afib Onset August 2014.  Patient denies palpitations or episodes of arrhythmia. RRR on exam today. -No further workup at this time.    Hypertension BP today 130/68. No report of headaches or dizziness.  -Continue telmisartan.  Disposition: BMP today. Return in 1 year.          Signed, Lonell Rives. Lendora Keys, DNP, NP-C

## 2023-12-03 ENCOUNTER — Ambulatory Visit: Attending: Student | Admitting: Student

## 2023-12-03 ENCOUNTER — Encounter: Payer: Self-pay | Admitting: Student

## 2023-12-03 VITALS — BP 130/68 | HR 65 | Ht 64.0 in | Wt 226.0 lb

## 2023-12-03 DIAGNOSIS — R600 Localized edema: Secondary | ICD-10-CM

## 2023-12-03 DIAGNOSIS — Z79899 Other long term (current) drug therapy: Secondary | ICD-10-CM

## 2023-12-03 DIAGNOSIS — I10 Essential (primary) hypertension: Secondary | ICD-10-CM

## 2023-12-03 DIAGNOSIS — Z8679 Personal history of other diseases of the circulatory system: Secondary | ICD-10-CM | POA: Diagnosis not present

## 2023-12-03 DIAGNOSIS — R0609 Other forms of dyspnea: Secondary | ICD-10-CM

## 2023-12-03 NOTE — Patient Instructions (Signed)
 Medication Instructions:  Your physician recommends that you continue on your current medications as directed. Please refer to the Current Medication list given to you today.  *If you need a refill on your cardiac medications before your next appointment, please call your pharmacy*  Lab Work: Your provider recommends that you have labs drawn today: BMET  If you have labs (blood work) drawn today and your tests are completely normal, you will receive your results only by: MyChart Message (if you have MyChart) OR A paper copy in the mail If you have any lab test that is abnormal or we need to change your treatment, we will call you to review the results.   Follow-Up: At [facility], you and your health needs are our priority.  As part of our continuing mission to provide you with exceptional heart care, our providers are all part of one team.  This team includes your primary Cardiologist (physician) and Advanced Practice Providers or APPs (Physician Assistants and Nurse Practitioners) who all work together to provide you with the care you need, when you need it.  Your next appointment:   12 month(s)  Provider:   Morey Ar, NP    We recommend signing up for the patient portal called "MyChart".  Sign up information is provided on this After Visit Summary.  MyChart is used to connect with patients for Virtual Visits (Telemedicine).  Patients are able to view lab/test results, encounter notes, upcoming appointments, etc.  Non-urgent messages can be sent to your provider as well.   To learn more about what you can do with MyChart, go to [email or web address].

## 2023-12-04 LAB — BASIC METABOLIC PANEL WITH GFR
BUN/Creatinine Ratio: 14 (ref 12–28)
BUN: 10 mg/dL (ref 8–27)
CO2: 24 mmol/L (ref 20–29)
Calcium: 9.5 mg/dL (ref 8.7–10.3)
Chloride: 104 mmol/L (ref 96–106)
Creatinine, Ser: 0.72 mg/dL (ref 0.57–1.00)
Glucose: 82 mg/dL (ref 70–99)
Potassium: 4.2 mmol/L (ref 3.5–5.2)
Sodium: 142 mmol/L (ref 134–144)
eGFR: 94 mL/min/{1.73_m2} (ref >59)

## 2023-12-16 DIAGNOSIS — G4733 Obstructive sleep apnea (adult) (pediatric): Secondary | ICD-10-CM | POA: Diagnosis not present

## 2023-12-20 DIAGNOSIS — G4733 Obstructive sleep apnea (adult) (pediatric): Secondary | ICD-10-CM | POA: Diagnosis not present

## 2024-01-08 ENCOUNTER — Ambulatory Visit: Admitting: Cardiovascular Disease

## 2024-01-16 DIAGNOSIS — G4733 Obstructive sleep apnea (adult) (pediatric): Secondary | ICD-10-CM | POA: Diagnosis not present

## 2024-01-22 ENCOUNTER — Other Ambulatory Visit: Payer: Self-pay

## 2024-01-22 ENCOUNTER — Other Ambulatory Visit: Payer: Self-pay | Admitting: Internal Medicine

## 2024-01-25 ENCOUNTER — Other Ambulatory Visit: Payer: Self-pay

## 2024-01-25 ENCOUNTER — Ambulatory Visit

## 2024-01-25 ENCOUNTER — Other Ambulatory Visit: Payer: Self-pay | Admitting: Internal Medicine

## 2024-01-25 MED FILL — Celecoxib Cap 200 MG: ORAL | 30 days supply | Qty: 30 | Fill #0 | Status: AC

## 2024-02-15 DIAGNOSIS — G4733 Obstructive sleep apnea (adult) (pediatric): Secondary | ICD-10-CM | POA: Diagnosis not present

## 2024-02-25 ENCOUNTER — Ambulatory Visit: Payer: Self-pay

## 2024-02-25 ENCOUNTER — Other Ambulatory Visit: Payer: Self-pay

## 2024-02-25 DIAGNOSIS — I10 Essential (primary) hypertension: Secondary | ICD-10-CM

## 2024-02-25 MED ORDER — TELMISARTAN 80 MG PO TABS
80.0000 mg | ORAL_TABLET | Freq: Every day | ORAL | 1 refills | Status: DC
  Filled 2024-02-25: qty 90, 90d supply, fill #0

## 2024-02-25 MED ORDER — FUROSEMIDE 20 MG PO TABS
20.0000 mg | ORAL_TABLET | Freq: Every day | ORAL | 3 refills | Status: AC
  Filled 2024-02-25: qty 90, 90d supply, fill #0

## 2024-02-25 NOTE — Telephone Encounter (Signed)
 FYI Only or Action Required?: Action required by provider: request for appointment.  Patient was last seen in primary care on 10/22/2023 by Jessica Verneita CROME, MD.  Called Nurse Triage reporting Hypertension.  Symptoms began several days ago.  Interventions attempted: Prescription medications: telmisartan  and lasix .  Symptoms are: unchanged.  Triage Disposition: See Physician Within 24 Hours  Patient/caregiver understands and will follow disposition?: YesCopied from CRM #[id number]. Topic: Clinical - Red Word Triage >> Feb 25, 2024 10:08 AM Jessica Clayton wrote: Red Word that prompted transfer to Nurse Triage: swelling and blood pressure is high as 185/102 . Had blood pressure medicine but its really not going down. Reason for Disposition  Systolic BP >= 180 OR Diastolic >= 110  Answer Assessment - Initial Assessment Questions 1. BLOOD PRESSURE: What is your blood pressure? Did you take at least two measurements 5 minutes apart?     162/74 2. ONSET: When did you take your blood pressure?     Tuesday  3. HOW: How did you take your blood pressure? (e.g., automatic home BP monitor, visiting nurse)     Automatic cuff 4. HISTORY: Do you have a history of high blood pressure?     yes 5. MEDICINES: Are you taking any medicines for blood pressure? Have you missed any doses recently?     Telmisartan   6. OTHER SYMPTOMS: Do you have any symptoms? (e.g., blurred vision, chest pain, difficulty breathing, headache, weakness)     headache   Tuesday- headache all day 167/92. Bedtime 182/90- felt dehydrated Wednesday- 167/82AM; 185/102 PM Thursday- 182/90 AM; 162/74 just now Pt has taken medication as prescribed.   Swelling in feet and pt restarted lasix . Pt hasn't seen much difference. Pt denies headache currently.Pt is at work.  Protocols used: Blood Pressure - High-A-AH

## 2024-02-25 NOTE — Telephone Encounter (Signed)
 Called pt and she agreered to start taking 80 Mg of Telmisartan . She has an appointment with Dr. Marylynn on 02/26/2024 at that time she will scheduled her lab and nurse visit.

## 2024-02-26 ENCOUNTER — Encounter: Payer: Self-pay | Admitting: Internal Medicine

## 2024-02-26 ENCOUNTER — Other Ambulatory Visit: Payer: Self-pay

## 2024-02-26 ENCOUNTER — Ambulatory Visit: Admitting: Internal Medicine

## 2024-02-26 VITALS — BP 142/72 | HR 59 | Temp 98.1°F | Resp 59 | Ht 64.0 in | Wt 229.1 lb

## 2024-02-26 DIAGNOSIS — I480 Paroxysmal atrial fibrillation: Secondary | ICD-10-CM | POA: Diagnosis not present

## 2024-02-26 DIAGNOSIS — I10 Essential (primary) hypertension: Secondary | ICD-10-CM | POA: Diagnosis not present

## 2024-02-26 DIAGNOSIS — R7303 Prediabetes: Secondary | ICD-10-CM

## 2024-02-26 DIAGNOSIS — E785 Hyperlipidemia, unspecified: Secondary | ICD-10-CM

## 2024-02-26 DIAGNOSIS — R55 Syncope and collapse: Secondary | ICD-10-CM

## 2024-02-26 DIAGNOSIS — E538 Deficiency of other specified B group vitamins: Secondary | ICD-10-CM | POA: Diagnosis not present

## 2024-02-26 DIAGNOSIS — R42 Dizziness and giddiness: Secondary | ICD-10-CM | POA: Diagnosis not present

## 2024-02-26 LAB — COMPREHENSIVE METABOLIC PANEL WITH GFR
ALT: 21 U/L (ref 0–35)
AST: 20 U/L (ref 0–37)
Albumin: 4.3 g/dL (ref 3.5–5.2)
Alkaline Phosphatase: 81 U/L (ref 39–117)
BUN: 8 mg/dL (ref 6–23)
CO2: 33 meq/L — ABNORMAL HIGH (ref 19–32)
Calcium: 9.3 mg/dL (ref 8.4–10.5)
Chloride: 102 meq/L (ref 96–112)
Creatinine, Ser: 0.7 mg/dL (ref 0.40–1.20)
GFR: 91.91 mL/min (ref >60.00)
Glucose, Bld: 101 mg/dL — ABNORMAL HIGH (ref 70–99)
Potassium: 4.2 meq/L (ref 3.5–5.1)
Sodium: 140 meq/L (ref 135–145)
Total Bilirubin: 0.7 mg/dL (ref 0.2–1.2)
Total Protein: 6.5 g/dL (ref 6.0–8.3)

## 2024-02-26 LAB — MICROALBUMIN / CREATININE URINE RATIO
Creatinine,U: 129.5 mg/dL
Microalb Creat Ratio: 10.9 mg/g (ref 0.0–30.0)
Microalb, Ur: 1.4 mg/dL (ref 0.0–1.9)

## 2024-02-26 LAB — CBC WITH DIFFERENTIAL/PLATELET
Basophils Absolute: 0.1 K/uL (ref 0.0–0.1)
Basophils Relative: 0.8 % (ref 0.0–3.0)
Eosinophils Absolute: 0.2 K/uL (ref 0.0–0.7)
Eosinophils Relative: 2.8 % (ref 0.0–5.0)
HCT: 39.8 % (ref 36.0–46.0)
Hemoglobin: 13.2 g/dL (ref 12.0–15.0)
Lymphocytes Relative: 23.2 % (ref 12.0–46.0)
Lymphs Abs: 1.6 K/uL (ref 0.7–4.0)
MCHC: 33.1 g/dL (ref 30.0–36.0)
MCV: 89.6 fl (ref 78.0–100.0)
Monocytes Absolute: 0.6 K/uL (ref 0.1–1.0)
Monocytes Relative: 8.5 % (ref 3.0–12.0)
Neutro Abs: 4.3 K/uL (ref 1.4–7.7)
Neutrophils Relative %: 64.7 % (ref 43.0–77.0)
Platelets: 346 K/uL (ref 150.0–400.0)
RBC: 4.45 Mil/uL (ref 3.87–5.11)
RDW: 13.9 % (ref 11.5–15.5)
WBC: 6.7 K/uL (ref 4.0–10.5)

## 2024-02-26 LAB — HEMOGLOBIN A1C: Hgb A1c MFr Bld: 5.5 % (ref 4.6–6.5)

## 2024-02-26 MED ORDER — VALSARTAN-HYDROCHLOROTHIAZIDE 320-25 MG PO TABS
1.0000 | ORAL_TABLET | Freq: Every day | ORAL | 3 refills | Status: AC
  Filled 2024-02-26: qty 90, 90d supply, fill #0
  Filled 2024-07-07: qty 90, 90d supply, fill #1

## 2024-02-26 MED ORDER — CYANOCOBALAMIN 1000 MCG/ML IJ SOLN
1000.0000 ug | Freq: Once | INTRAMUSCULAR | Status: AC
  Administered 2024-02-26: 1000 ug via INTRAMUSCULAR

## 2024-02-26 NOTE — Patient Instructions (Addendum)
 I am changing your blood pressure medication from telmisartan  to valsartan /hct.  Take it once daily before work  but NOT BEFORE SLEEP)   Keep the furosemide  for AS NEEDED

## 2024-02-26 NOTE — Progress Notes (Unsigned)
 Subjective:  Patient ID: Jessica Clayton, female    DOB: Apr 29, 1961  Age: 63 y.o. MRN: [id number]  CC: There were no encounter diagnoses.   HPI Jessica Clayton presents for  Chief Complaint  Patient presents with   Hypertension   Jessica Clayton IS A 63 yr old female with a history of morbid obesity, hypertension and OSA who was worked in today for management of uncontrolled BP . BP regimen prior to last 24 hors was telmisartan  40 mg daily and furosemide  20 mg prn . Prior cardiac eval following an ER visit in march for dsypnea included a normal ECHO in April 2025   no LVH,  no diastolic dysfunction.  FOR THE LAST SEVERAL DAYS .  REVIEWED SALT AND CAFFEINE INTAKE. AVOIDS SALT EXCEPT ON 2 TOMATO SANDWICHES  ONLY ONE CUP OF ICE COFFEE   OBESITY : weight regain of 20 lbs after ozempic  was stopped due to cost  EATING MORE VEGETABLES THAN MEAT ,  EGGS/AVOCADOES /TOAST FOR BREAKFAST   NOT WALKIN G OR EXERCISING DUE TO PERSISTENT LEFT KNEE PAIN , NOW LEFT THIGH PAIN . HGAS DEFERRED SURGERY   Outpatient Medications Prior to Visit  Medication Sig Dispense Refill   albuterol  (VENTOLIN  HFA) 108 (90 Base) MCG/ACT inhaler Inhale 2 puffs into the lungs every 6 (six) hours as needed for wheezing or shortness of breath. 6.7 g 2   aspirin 81 MG tablet Take 81 mg by mouth daily.     celecoxib  (CELEBREX ) 200 MG capsule Take 1 capsule (200 mg total) by mouth daily. 30 capsule 2   cyclobenzaprine  (FLEXERIL ) 5 MG tablet Take 1 tablet (5 mg total) by mouth at bedtime as needed. 90 tablet 1   DANDELION PO Take by mouth daily.     furosemide  (LASIX ) 20 MG tablet Take 1 tablet (20 mg total) by mouth daily. 90 tablet 3   Iron , Ferrous Sulfate , 325 (65 Fe) MG TABS Take 325 mg by mouth daily. 90 tablet 1   MILK THISTLE PO Take by mouth daily.     omeprazole  (PRILOSEC) 20 MG capsule Take 1 capsule (20 mg total) by mouth daily. 30 capsule 3   telmisartan  (MICARDIS ) 80 MG tablet Take 1 tablet (80 mg total) by  mouth daily. 90 tablet 1   TURMERIC PO Take by mouth daily.     VITAMIN D  PO Take by mouth.     No facility-administered medications prior to visit.    Review of Systems;  Patient denies headache, fevers, malaise, unintentional weight loss, skin rash, eye pain, sinus congestion and sinus pain, sore throat, dysphagia,  hemoptysis , cough, dyspnea, wheezing, chest pain, palpitations, orthopnea, edema, abdominal pain, nausea, melena, diarrhea, constipation, flank pain, dysuria, hematuria, urinary  Frequency, nocturia, numbness, tingling, seizures,  Focal weakness, Loss of consciousness,  Tremor, insomnia, depression, anxiety, and suicidal ideation.      Objective:  BP (!) 142/72   Pulse (!) 59   Temp 98.1 F (36.7 C)   Resp (!) 59   Ht 5' 4 (1.626 m)   Wt 229 lb 2 oz (103.9 kg)   SpO2 98%   BMI 39.33 kg/m   BP Readings from Last 3 Encounters:  02/26/24 (!) 142/72  12/03/23 130/68  10/27/23 126/78    Wt Readings from Last 3 Encounters:  02/26/24 229 lb 2 oz (103.9 kg)  12/03/23 226 lb (102.5 kg)  10/27/23 223 lb 12.8 oz (101.5 kg)    Physical Exam  Lab Results  Component Value  Date   HGBA1C 4.8 05/26/2023   HGBA1C 5.1 05/21/2022   HGBA1C 5.1 12/06/2021    Lab Results  Component Value Date   CREATININE 0.72 12/03/2023   CREATININE 0.88 10/27/2023   CREATININE 0.79 10/22/2023    Lab Results  Component Value Date   WBC 6.3 10/22/2023   HGB 13.0 10/22/2023   HCT 40.2 10/22/2023   PLT 271 10/22/2023   GLUCOSE 82 12/03/2023   CHOL 167 05/25/2023   TRIG 70 05/25/2023   HDL 60 05/25/2023   LDLDIRECT 92.0 12/08/2022   LDLCALC 91 05/25/2023   ALT 12 05/25/2023   AST 15 05/25/2023   NA 142 12/03/2023   K 4.2 12/03/2023   CL 104 12/03/2023   CREATININE 0.72 12/03/2023   BUN 10 12/03/2023   CO2 24 12/03/2023   TSH 1.82 05/21/2022   HGBA1C 4.8 05/26/2023   MICROALBUR 0.2 12/06/2021    CT Angio Chest PE W and/or Wo Contrast Result Date: 10/22/2023 CLINICAL  DATA:  Shortness of breath and chest pressure. EXAM: CT ANGIOGRAPHY CHEST WITH CONTRAST TECHNIQUE: Multidetector CT imaging of the chest was performed using the standard protocol during bolus administration of intravenous contrast. Multiplanar CT image reconstructions and MIPs were obtained to evaluate the vascular anatomy. RADIATION DOSE REDUCTION: This exam was performed according to the departmental dose-optimization program which includes automated exposure control, adjustment of the mA and/or kV according to patient size and/or use of iterative reconstruction technique. CONTRAST:  OMNIPAQUE  IOHEXOL  350 MG/ML SOLN COMPARISON:  July 16, 2015 FINDINGS: Cardiovascular: Satisfactory opacification of the pulmonary arteries to the segmental level. No evidence of pulmonary embolism. Normal heart size. No pericardial effusion. Mediastinum/Nodes: No enlarged mediastinal, hilar, or axillary lymph nodes. Thyroid  gland, trachea, and esophagus demonstrate no significant findings. Lungs/Pleura: Lungs are clear. No pleural effusion or pneumothorax. Upper Abdomen: Multiple surgical clips are seen within the gallbladder fossa. Musculoskeletal: Anterior surgical fusion of the lower cervical spine and upper thoracic spine is noted. Review of the MIP images confirms the above findings. IMPRESSION: 1. No evidence of pulmonary embolism or other acute intrathoracic process. 2. Evidence of prior cholecystectomy. Electronically Signed   By: Suzen Dials M.D.   On: 10/22/2023 23:16   DG Chest 2 View Result Date: 10/22/2023 CLINICAL DATA:  Acute on chronic shortness of breath EXAM: CHEST - 2 VIEW COMPARISON:  X-ray 11/18/2021 FINDINGS: No consolidation, pneumothorax or effusion. No edema. Normal cardiopericardial silhouette. Degenerative changes seen of the spine on lateral view. Mild peribronchial thickening. Fixation hardware along the lower cervical spine at the edge of the imaging field. Surgical clips in the right  upper quadrant of the abdomen. Minimal left basilar scar or atelectasis. IMPRESSION: Peribronchial thickening. Minimal left basilar scar or atelectasis. No consolidation. Electronically Signed   By: Ranell Bring M.D.   On: 10/22/2023 16:09   US  Venous Img Lower Unilateral Left (DVT) Result Date: 10/22/2023 CLINICAL DATA:  Swelling left leg.  Shortness of breath EXAM: Left LOWER EXTREMITY VENOUS DOPPLER ULTRASOUND TECHNIQUE: Gray-scale sonography with compression, as well as color and duplex ultrasound, were performed to evaluate the deep venous system(s) from the level of the common femoral vein through the popliteal and proximal calf veins. COMPARISON:  None Available. FINDINGS: VENOUS Normal compressibility of the common femoral, superficial femoral, and popliteal veins, as well as the visualized calf veins. Visualized portions of profunda femoral vein and great saphenous vein unremarkable. No filling defects to suggest DVT on grayscale or color Doppler imaging. Doppler waveforms show normal  direction of venous flow, normal respiratory plasticity and response to augmentation. Limited views of the contralateral common femoral vein are unremarkable. OTHER None. Limitations: none IMPRESSION: No evidence of left lower extremity DVT. Electronically Signed   By: Ranell Bring M.D.   On: 10/22/2023 16:08    Assessment & Plan:  .There are no diagnoses linked to this encounter.   I spent 34 minutes on the day of this face to face encounter reviewing patient's  most recent visit with cardiology,  nephrology,  and neurology,  prior relevant surgical and non surgical procedures, recent  labs and imaging studies, counseling on weight management,  reviewing the assessment and plan with patient, and post visit ordering and reviewing of  diagnostics and therapeutics with patient  .   Follow-up: No follow-ups on file.   Verneita LITTIE Kettering, MD

## 2024-02-27 ENCOUNTER — Ambulatory Visit: Payer: Self-pay | Admitting: Internal Medicine

## 2024-02-27 NOTE — Assessment & Plan Note (Signed)
 Not controlled on max dose telmisartan .  Changing to valsartan /hct

## 2024-02-27 NOTE — Assessment & Plan Note (Signed)
 Weight regain of 0 lbs noted since Wegovy  was d/c due to cost. Counselled on diet and exercise.

## 2024-02-27 NOTE — Assessment & Plan Note (Signed)
She has had no recent episodes of arrhythmia.

## 2024-02-27 NOTE — Assessment & Plan Note (Signed)
 Her cholesterol remains low with  dietary changes.  No medication indicated.   Lab Results  Component Value Date   CHOL 167 05/25/2023   HDL 60 05/25/2023   LDLCALC 91 05/25/2023   LDLDIRECT 92.0 12/08/2022   TRIG 70 05/25/2023   CHOLHDL 2.8 05/25/2023

## 2024-03-23 DIAGNOSIS — G4733 Obstructive sleep apnea (adult) (pediatric): Secondary | ICD-10-CM | POA: Diagnosis not present

## 2024-04-03 MED FILL — Celecoxib Cap 200 MG: ORAL | 30 days supply | Qty: 30 | Fill #1 | Status: CN

## 2024-04-15 ENCOUNTER — Other Ambulatory Visit: Payer: Self-pay

## 2024-05-31 DIAGNOSIS — M7581 Other shoulder lesions, right shoulder: Secondary | ICD-10-CM | POA: Diagnosis not present

## 2024-07-07 ENCOUNTER — Other Ambulatory Visit: Payer: Self-pay

## 2024-07-07 DIAGNOSIS — M7632 Iliotibial band syndrome, left leg: Secondary | ICD-10-CM | POA: Diagnosis not present

## 2024-07-07 MED ORDER — METHYLPREDNISOLONE 4 MG PO TBPK
ORAL_TABLET | ORAL | 0 refills | Status: AC
  Filled 2024-07-07 (×2): qty 21, 6d supply, fill #0

## 2024-08-09 DIAGNOSIS — G4733 Obstructive sleep apnea (adult) (pediatric): Secondary | ICD-10-CM | POA: Diagnosis not present

## 2024-09-27 ENCOUNTER — Ambulatory Visit: Admitting: Internal Medicine
